# Patient Record
Sex: Male | Born: 1955 | Race: White | Hispanic: No | Marital: Married | Smoking: Never smoker
Health system: Southern US, Community
[De-identification: ages and names within clinical notes are randomized; demographics above are authoritative.]

## PROBLEM LIST (undated history)

## (undated) DIAGNOSIS — M545 Low back pain, unspecified: Secondary | ICD-10-CM

## (undated) DIAGNOSIS — Z9889 Other specified postprocedural states: Secondary | ICD-10-CM

## (undated) DIAGNOSIS — G4700 Insomnia, unspecified: Secondary | ICD-10-CM

## (undated) DIAGNOSIS — C801 Malignant (primary) neoplasm, unspecified: Secondary | ICD-10-CM

## (undated) DIAGNOSIS — B191 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: Secondary | ICD-10-CM

## (undated) DIAGNOSIS — G8929 Other chronic pain: Secondary | ICD-10-CM

## (undated) DIAGNOSIS — E0590 Thyrotoxicosis, unspecified without thyrotoxic crisis or storm: Secondary | ICD-10-CM

## (undated) DIAGNOSIS — G479 Sleep disorder, unspecified: Secondary | ICD-10-CM

## (undated) DIAGNOSIS — Z6831 Body mass index (BMI) 31.0-31.9, adult: Secondary | ICD-10-CM

## (undated) DIAGNOSIS — T7840XA Allergy, unspecified, initial encounter: Secondary | ICD-10-CM

## (undated) DIAGNOSIS — M542 Cervicalgia: Secondary | ICD-10-CM

## (undated) DIAGNOSIS — F329 Major depressive disorder, single episode, unspecified: Secondary | ICD-10-CM

## (undated) DIAGNOSIS — E039 Hypothyroidism, unspecified: Secondary | ICD-10-CM

## (undated) DIAGNOSIS — R011 Cardiac murmur, unspecified: Secondary | ICD-10-CM

## (undated) DIAGNOSIS — D721 Eosinophilia, unspecified: Secondary | ICD-10-CM

## (undated) DIAGNOSIS — L299 Pruritus, unspecified: Secondary | ICD-10-CM

## (undated) DIAGNOSIS — M1990 Unspecified osteoarthritis, unspecified site: Secondary | ICD-10-CM

## (undated) DIAGNOSIS — E0500 Thyrotoxicosis with diffuse goiter without thyrotoxic crisis or storm: Secondary | ICD-10-CM

## (undated) DIAGNOSIS — M5090 Cervical disc disorder, unspecified, unspecified cervical region: Secondary | ICD-10-CM

## (undated) DIAGNOSIS — Z8489 Family history of other specified conditions: Secondary | ICD-10-CM

## (undated) DIAGNOSIS — R0600 Dyspnea, unspecified: Secondary | ICD-10-CM

## (undated) DIAGNOSIS — M7580 Other shoulder lesions, unspecified shoulder: Secondary | ICD-10-CM

## (undated) DIAGNOSIS — I4891 Unspecified atrial fibrillation: Secondary | ICD-10-CM

## (undated) DIAGNOSIS — G4730 Sleep apnea, unspecified: Secondary | ICD-10-CM

## (undated) DIAGNOSIS — R7303 Prediabetes: Secondary | ICD-10-CM

## (undated) DIAGNOSIS — I499 Cardiac arrhythmia, unspecified: Secondary | ICD-10-CM

## (undated) DIAGNOSIS — G709 Myoneural disorder, unspecified: Secondary | ICD-10-CM

## (undated) DIAGNOSIS — F419 Anxiety disorder, unspecified: Secondary | ICD-10-CM

## (undated) DIAGNOSIS — E785 Hyperlipidemia, unspecified: Secondary | ICD-10-CM

## (undated) HISTORY — PX: NECK SURGERY: SHX720

## (undated) HISTORY — PX: HAND SURGERY: SHX662

## (undated) HISTORY — DX: Unspecified osteoarthritis, unspecified site: M19.90

## (undated) HISTORY — PX: SHOULDER SURGERY: SHX246

## (undated) HISTORY — DX: 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: B19.10

## (undated) HISTORY — DX: Anxiety disorder, unspecified: F41.9

## (undated) HISTORY — PX: KNEE ARTHROSCOPY: SHX127

## (undated) HISTORY — DX: Myoneural disorder, unspecified: G70.9

## (undated) HISTORY — DX: Pruritus, unspecified: L29.9

## (undated) HISTORY — DX: Low back pain, unspecified: M54.50

## (undated) HISTORY — DX: Other specified postprocedural states: Z98.890

## (undated) HISTORY — DX: Thyrotoxicosis, unspecified without thyrotoxic crisis or storm: E05.90

## (undated) HISTORY — DX: Major depressive disorder, single episode, unspecified: F32.9

## (undated) HISTORY — DX: Cervicalgia: M54.2

## (undated) HISTORY — PX: LIPOMA EXCISION: SHX5283

## (undated) HISTORY — DX: Other chronic pain: G89.29

## (undated) HISTORY — PX: BACK SURGERY: SHX140

## (undated) HISTORY — DX: Body mass index (BMI) 31.0-31.9, adult: Z68.31

## (undated) HISTORY — DX: Thyrotoxicosis with diffuse goiter without thyrotoxic crisis or storm: E05.00

## (undated) HISTORY — DX: Cardiac murmur, unspecified: R01.1

## (undated) HISTORY — DX: Allergy, unspecified, initial encounter: T78.40XA

## (undated) HISTORY — DX: Eosinophilia, unspecified: D72.10

## (undated) HISTORY — DX: Cervical disc disorder, unspecified, unspecified cervical region: M50.90

## (undated) HISTORY — DX: Other shoulder lesions, unspecified shoulder: M75.80

## (undated) HISTORY — DX: Insomnia, unspecified: G47.00

## (undated) HISTORY — PX: FISSURECTOMY: SHX5244

## (undated) HISTORY — DX: Hyperlipidemia, unspecified: E78.5

---
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
## 1998-06-25 ENCOUNTER — Ambulatory Visit (HOSPITAL_BASED_OUTPATIENT_CLINIC_OR_DEPARTMENT_OTHER): Admission: RE | Admit: 1998-06-25 | Discharge: 1998-06-25 | Payer: Self-pay | Admitting: Orthopedic Surgery

## 2003-07-05 ENCOUNTER — Ambulatory Visit (HOSPITAL_COMMUNITY): Admission: RE | Admit: 2003-07-05 | Discharge: 2003-07-05 | Payer: Self-pay | Admitting: Orthopedic Surgery

## 2003-08-18 ENCOUNTER — Encounter: Admission: RE | Admit: 2003-08-18 | Discharge: 2003-08-18 | Payer: Self-pay | Admitting: Orthopaedic Surgery

## 2003-09-05 HISTORY — PX: NECK SURGERY: SHX720

## 2003-09-27 ENCOUNTER — Inpatient Hospital Stay (HOSPITAL_COMMUNITY): Admission: RE | Admit: 2003-09-27 | Discharge: 2003-09-28 | Payer: Self-pay | Admitting: Neurosurgery

## 2006-02-02 ENCOUNTER — Ambulatory Visit (HOSPITAL_BASED_OUTPATIENT_CLINIC_OR_DEPARTMENT_OTHER): Admission: RE | Admit: 2006-02-02 | Discharge: 2006-02-02 | Payer: Self-pay | Admitting: Orthopedic Surgery

## 2008-12-05 DIAGNOSIS — Z9889 Other specified postprocedural states: Secondary | ICD-10-CM

## 2008-12-05 HISTORY — DX: Other specified postprocedural states: Z98.890

## 2010-01-01 ENCOUNTER — Encounter: Admission: RE | Admit: 2010-01-01 | Discharge: 2010-01-01 | Payer: Self-pay | Admitting: Orthopedic Surgery

## 2010-04-16 ENCOUNTER — Ambulatory Visit (HOSPITAL_BASED_OUTPATIENT_CLINIC_OR_DEPARTMENT_OTHER): Admission: RE | Admit: 2010-04-16 | Discharge: 2010-04-16 | Payer: Self-pay | Admitting: Orthopedic Surgery

## 2010-06-28 ENCOUNTER — Encounter
Admission: RE | Admit: 2010-06-28 | Discharge: 2010-06-28 | Payer: Self-pay | Source: Home / Self Care | Attending: Neurosurgery | Admitting: Neurosurgery

## 2010-09-18 LAB — POCT HEMOGLOBIN-HEMACUE: Hemoglobin: 15.3 g/dL (ref 13.0–17.0)

## 2010-11-22 NOTE — Op Note (Signed)
NAME:  Joshua Lowe, Joshua Lowe                            ACCOUNT NO.:  [id number]   MEDICAL RECORD NO.:  [id number]                   PATIENT TYPE:  INP   LOCATION:  3013                                 FACILITY:  [facility]   PHYSICIAN:  Hewitt Shorts, M.D.            DATE OF BIRTH:  Jan 17, 1956   DATE OF PROCEDURE:  09/27/2003  DATE OF DISCHARGE:                                 OPERATIVE REPORT   PREOPERATIVE DIAGNOSIS:  C4-5 and C5-6 cervical spondylosis, degenerative  disk disease, and radiculopathy.   POSTOPERATIVE DIAGNOSIS:  C4-5 and C5-6 cervical spondylosis, degenerative  disk disease, and radiculopathy, and right C5-6 cervical disk herniation.   PROCEDURE:  C4-5 and C5-6 anterior cervical diskectomy and arthrodesis with  iliac crest allograft and Trinica cervical plating.   SURGEON:  Sandria Bales. Ezzard Standing, M.D.   ASSISTANT:  Stefani Dama, M.D.   ANESTHESIA:  General endotracheal.   INDICATIONS:  The patient is a 55 year old man who presented with a right  cervical radiculopathy, with weakness of the right biceps.  He was studied  with MRI and subsequently a myelogram and post-myelogram CT scan.  The  decision was made to proceed with decompression and arthrodesis.   PROCEDURE:  The patient was brought to the operating room and placed under  general endotracheal anesthesia.  The patient was placed in 10 pounds of  Holter traction and the neck was prepped with Betadine soap and solution and  draped in a sterile fashion.  A horizontal incision was made in the left  side of the neck.  The line of the incision was infiltrated with local  anesthetic with epinephrine.  Dissection was carried down through the  subcutaneous tissue with bipolar cautery and electrocautery used to maintain  hemostasis.  Dissection was carried down through the platysma and then  through an avascular plane, leaving the sternocleidomastoid, carotid artery,  and jugular vein laterally and the trachea and  esophagus medially.  The  ventral aspect of the vertebral column was identified and a localizing x-ray  taken at C4-5 and C5-6 and the intervertebral disk space identified.  Diskectomy was begun at each level with incision of the annulus and  continued with the microcurettes and pituitary rongeurs.  Anterior  osteophytic overgrowth was removed using the osteophyte removal tool and  then the microscope was draped and brought into the field to provide  additional magnification, illumination, and visualization, and the remainder  of the decompression was performed using microdissection and microsurgical  technique.  The cartilaginous end plates of the corresponding vertebrae were  removed using the microcurettes along with the Micro-Max drill, and  dissection was carried out posteriorly, removing posterior osteophytic  overgrowth using the Micro-Max drill along with a 2 mm Kerrison punch with a  thin foot plate.  The posterior longitudinal ligament, which was thickened  at each level, was carefully removed, and we were able to decompress the  spinal canal and thecal sac.  We then decompressed the neural foramina  bilaterally and then by the nerve roots bilaterally.  At the right-sided C5-  6 where we expected to find spondylosis including spondylitic spurring and  thickened ligament tissue, we did encounter those findings but also  encountered a fragment of disk herniation.  This was removed and we were  able to decompress the right C6 nerve root and lateral thecal sac at that  level.  It was felt that the disk herniation that was found at the time of  surgery corresponded to his acute radiculopathy.   After the decompression was completed, hemostasis was established with the  use of Gelfoam soaked in thrombin.  All the Gelfoam was removed and we  proceeded with the arthrodesis.  We sized each of the disk spaces and  selected a 6 mm graft for C4-5 and a 7 mm graft for C5-6.  Each graft was   hydrated in saline solution and positioned in the intervertebral disk space  and counter sunk.  We then selected a two-level Trinica cervical plate.  It  was positioned over the fusion construct and secured to each of the  vertebrae with a pair of 4.2 x 14 mm variable self-drilling screws.  Screws  were placed in an alternating fashion.  All six screws were placed, fully  tightened, and then the locking system secured.  The traction had been  discontinued after the bone grafts had been placed but prior to the plating.  The wound was irrigated with bacitracin solution and checked for hemostasis,  which was established and confirmed.  An x-ray was taken that showed the  plate, screws, and grafts were all in good position and we proceeded with  closure.  The platysma was closed with interrupted, inverted 2-0 undyed  Vicryl sutures, the subcutaneous and subcuticular layer were closed with  interrupted, inverted 2-0 undyed Vicryl sutures, and the skin was  reapproximated with Dermabond.  The patient tolerated the procedure well.  The estimated blood loss was less than 50 mL.  Sponge and needle count were  correct.  Following surgery the patient was to be reversed from the  anesthetic, extubated, and transferred to the recovery room for further  care.                                               Hewitt Shorts, M.D.    RWN/MEDQ  D:  09/27/2003  T:  09/28/2003  Job:  [id number]

## 2010-11-22 NOTE — Op Note (Signed)
NAMEJAVYON, Joshua Lowe                  ACCOUNT NO.:  [id number]   MEDICAL RECORD NO.:  [id number]          PATIENT TYPE:  AMB   LOCATION:  DSC                          FACILITY:  [facility]   PHYSICIAN:  Robert A. Thurston Hole, M.D. 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   PROCEDURE:  1. Right knee EUA followed by arthroscopic partial medial and lateral      meniscectomies.  2. Right knee chondroplasty with synovectomy.  3. Right knee loose bodies excision.   SURGEON:  Dr. Salvatore Marvel.   ASSISTANT:  Kirstin Tomasa Rand, P.A.   ANESTHESIA:  General.   OPERATIVE TIME:  40 minutes.   COMPLICATIONS:  None.   INDICATIONS FOR PROCEDURE:  Joshua Lowe is a 55 year old gentleman who has had  eight to 10 months of increasing right knee pain with exam and MRI  documenting chondromalacia, loose bodies, medial and lateral meniscal tears  with synovitis.  He has failed conservative care and is now to undergo  arthroscopy.   DESCRIPTION OF PROCEDURE:  Joshua Lowe was brought to the operating room on  February 02, 2006 and placed on the operative table in the supine position.  He  received Ancef 1 gram IV preoperatively for prophylaxis.  His right knee was  examined under anesthesia.  He had a full range of motion and he had a  stable ligamentous exam.  The knee was sterilely injected with 0.25-percent  Marcaine with epinephrine.  The right leg was prepped using sterile  technique and draped using sterile technique.  Originally through an  anterolateral portal, the arthroscope with a pump attached was placed and  through an  anterolateral portal, an arthroscopic probe was placed.  On  initial inspection in the medial compartment, he was found to have 75  percent grade 3 chondromalacia which was debrided.  Loose articular  cartilage pieces floating in the joint were removed.  Medial meniscus tear  posterior and medial horn of which 30 to 40 percent was resected back to a  stable rim.  Medial tibial plateau showed 30 percent grade 3 and the rest  grade 1 to 2 changes and this was debrided.  The intercondylar notch was  inspected and the anterior and posterior cruciate ligaments were normal.  The lateral compartment was inspected with 30 to 40 percent grade 3  chondromalacia laterally and on the lateral upper condyle was debrided.  Lateral tibial plateau showed grade 1 and 2 changes.  Lateral meniscus tear  and posterior and lateral horn, of which 20 percent was resected back to a  stable rim.  Loose articular cartilage pieces floating in the lateral  compartment were also removed.  Patellofemoral joint showed 80 percent grade  3 chondromalacia of the patellofemoral groove and this  was thoroughly  debrided.  Large inferomedial patella spur was removed with a 4-mm bur.  Normal patellar tracking was noted.  Significant synovitis in the medial  gutters were debrided.  Medial gutters had loose articular cartilage pieces  which were also removed.  Otherwise they were free of pathology.  After this  was done, it was felt that all pathology had been satisfactorily addressed.  The instruments were removed.  Portals closed with 3-0 nylon sutures and  injected with 0.25-percent Marcaine with epinephrine and 4 mg of morphine.  Sterile dressing was applied and the patient awakened and taken to the  recovery room in stable condition.   FOLLOW UP:  Joshua Lowe will be followed as an outpatient on Vicodin and  voltaren.  We will see him back in the office in one week for sutures out  and follow-up.      Robert A. Thurston Hole,  M.D.  Electronically Signed     RAW/MEDQ  D:  02/02/2006  T:  02/03/2006  Job:  [id number]

## 2011-11-20 ENCOUNTER — Encounter (INDEPENDENT_AMBULATORY_CARE_PROVIDER_SITE_OTHER): Payer: Self-pay | Admitting: Surgery

## 2011-11-24 ENCOUNTER — Encounter (INDEPENDENT_AMBULATORY_CARE_PROVIDER_SITE_OTHER): Payer: Self-pay | Admitting: Surgery

## 2011-11-24 ENCOUNTER — Ambulatory Visit (INDEPENDENT_AMBULATORY_CARE_PROVIDER_SITE_OTHER): Payer: BC Managed Care – PPO | Admitting: Surgery

## 2011-11-24 DIAGNOSIS — K603 Anal fistula, unspecified: Secondary | ICD-10-CM | POA: Insufficient documentation

## 2011-11-24 DIAGNOSIS — K602 Anal fissure, unspecified: Secondary | ICD-10-CM | POA: Insufficient documentation

## 2011-11-24 NOTE — Progress Notes (Signed)
Patient ID: [name], male   DOB: 10/31/1955, 56 y.o.   MRN: [id number]  Chief Complaint  Patient presents with  . Pre-op Exam    eval poss fistula/fissure    HPI Joshua Lowe is a 56 y.o. male.  This is a very pleasant gentleman referred by Dr. Eula Listen for evaluation of an anal fissure and fistula. He has had a problem since 2007 off and on. His most recent attack was treated with diltiazem cream. His pain completely resolved when he saw Dr. Eula Listen, a fistula was noted. The patient will leak occasional blood but no purulence or stool. Again he is currently pain-free. He does have intermittent constipation. He has had no sphincter issues. HPI  Past Medical History  Diagnosis Date  . Insomnia   . Depression   . Anxiety   . H/O colonoscopy 12/2008  . Chronic neck pain   . Allergy   . Hyperlipidemia   . AC (acromioclavicular) joint bone spurs   . Arthritis   . Heart murmur   . Neuromuscular disorder   . Osteoporosis     Past Surgical History  Procedure Date  . Shoulder surgery   . Fissurectomy   . Neck surgery   . Lipoma excision     back  . Knee arthroscopy 1999/2007    right knee    Family History  Problem Relation Age of Onset  . Cancer Mother     skin    Social History History  Substance Use Topics  . Smoking status: Former Games developer  . Smokeless tobacco: Former Neurosurgeon    Quit date: 11/24/1986  . Alcohol Use: 0.0 oz/week    1-2 Cans of beer per week    Allergies  Allergen Reactions  . Bee Venom     Current Outpatient Prescriptions  Medication Sig Dispense Refill  . cetirizine (ZYRTEC) 10 MG tablet Take 10 mg by mouth as needed.      . diltiazem 2 % GEL Apply topically 2 (two) times daily.      Marland Kitchen EPINEPHrine (EPIPEN JR) 0.15 MG/0.3ML injection Inject 0.15 mg into the muscle as needed.      Marland Kitchen HYDROcodone-acetaminophen (NORCO) 5-325 MG per tablet Take 1 tablet by mouth every 6 (six) hours as needed.      . traZODone (DESYREL) 150 MG tablet Take 150 mg by  mouth at bedtime.      Marland Kitchen zolpidem (AMBIEN) 10 MG tablet Take 10 mg by mouth at bedtime as needed.        Review of Systems Review of Systems  Constitutional: Negative for fever, chills and unexpected weight change.  HENT: Negative for hearing loss, congestion, sore throat, trouble swallowing and voice change.   Eyes: Negative for visual disturbance.  Respiratory: Negative for cough and wheezing.   Cardiovascular: Negative for chest pain, palpitations and leg swelling.  Gastrointestinal: Positive for constipation and rectal pain. Negative for nausea, vomiting, abdominal pain, diarrhea, blood in stool, abdominal distention and anal bleeding.  Genitourinary: Negative for hematuria and difficulty urinating.  Musculoskeletal: Negative for arthralgias.  Skin: Negative for rash and wound.  Neurological: Negative for seizures, syncope, weakness and headaches.  Hematological: Negative for adenopathy. Does not bruise/bleed easily.  Psychiatric/Behavioral: Negative for confusion.    Blood pressure 132/80, pulse 78, height 6' (1.829 m), weight 221 lb 12.8 oz (100.608 kg), SpO2 96.00%.  Physical Exam Physical Exam  Constitutional: He is oriented to person, place, and time. He appears well-developed and well-nourished.  HENT:  Head: Normocephalic and atraumatic.  Right Ear: External ear normal.  Left Ear: External ear normal.  Nose: Nose normal.  Mouth/Throat: Oropharynx is clear and moist.  Eyes: Conjunctivae are normal. Pupils are equal, round, and reactive to light.  Neck: Normal range of motion. Neck supple. No tracheal deviation present. No thyromegaly present.  Cardiovascular: Normal rate, regular rhythm, normal heart sounds and intact distal pulses.   No murmur heard. Pulmonary/Chest: Effort normal and breath sounds normal. No respiratory distress. He has no wheezes. He has no rales.  Abdominal: Soft. Bowel sounds are normal. He exhibits no distension. There is tenderness.    Genitourinary:       On rectal exam, there is an obvious fistula opening just to the left of the posterior midline. I attempted a digital exam, but his sphincter was tight and painful.  Lymphadenopathy:    He has no cervical adenopathy.  Neurological: He is alert and oriented to person, place, and time.  Skin: Skin is warm and dry. No rash noted. No erythema.  Psychiatric: His behavior is normal.    Data Reviewed   Assessment    Anal fistula and fissure    Plan    I explained the diagnosis to the patient his wife in detail. I discussed options which included conservative measures with going ahead and proceeding with surgery. I asked when into the fistula would not heal without surgical intervention. I did explain that it was not emergent if he wanted to wait and see our new colorectal surgeon coming in September without having to go and proceed with that now. I believe he would need a sphincterotomy as well as a fissurectomy. He is going to think about it and call us back       Binh Doten A 11/24/2011, 11:25 AM

## 2012-07-19 ENCOUNTER — Ambulatory Visit (INDEPENDENT_AMBULATORY_CARE_PROVIDER_SITE_OTHER): Payer: BC Managed Care – PPO | Admitting: General Surgery

## 2012-07-19 ENCOUNTER — Encounter (INDEPENDENT_AMBULATORY_CARE_PROVIDER_SITE_OTHER): Payer: Self-pay | Admitting: General Surgery

## 2012-07-19 VITALS — BP 122/78 | HR 68 | Temp 98.2°F | Resp 16 | Ht 72.0 in | Wt 214.4 lb

## 2012-07-19 DIAGNOSIS — K603 Anal fistula, unspecified: Secondary | ICD-10-CM

## 2012-07-19 DIAGNOSIS — K602 Anal fissure, unspecified: Secondary | ICD-10-CM

## 2012-07-19 NOTE — Progress Notes (Signed)
Chief Complaint  Patient presents with  . New Evaluation    EPNP:eval anal fissure    HISTORY: Joshua Lowe is a 57 y.o. male who presents to the office with rectal pain.  Other symptoms include occasional bleeding.  This had been occurring for several years.  He has tried diltiazem cream in the past with good results.  Hard Bm's makes the symptoms worse.   It is intermittent in nature.  His bowel habits are regular and his bowel movements are soft.  His fiber intake is good.  His last colonoscopy was 4 years ago and he is due again at 6 yrs of age.  He denies any prolapsing tissue.     Past Medical History  Diagnosis Date  . Insomnia   . Depression   . Anxiety   . H/O colonoscopy 12/2008  . Chronic neck pain   . Allergy   . Hyperlipidemia   . AC (acromioclavicular) joint bone spurs   . Arthritis   . Heart murmur   . Neuromuscular disorder   . Osteoporosis       Past Surgical History  Procedure Date  . Shoulder surgery   . Fissurectomy   . Neck surgery   . Lipoma excision     back  . Knee arthroscopy 1999/2007    right knee        Current Outpatient Prescriptions  Medication Sig Dispense Refill  . cetirizine (ZYRTEC) 10 MG tablet Take 10 mg by mouth as needed.      Marland Kitchen EPINEPHrine (EPIPEN JR) 0.15 MG/0.3ML injection Inject 0.15 mg into the muscle as needed.      Marland Kitchen HYDROcodone-acetaminophen (NORCO) 5-325 MG per tablet Take 1 tablet by mouth every 6 (six) hours as needed.      . traZODone (DESYREL) 150 MG tablet Take 150 mg by mouth at bedtime. 50 mg taken at bedtime      . zolpidem (AMBIEN) 10 MG tablet Take 10 mg by mouth at bedtime as needed.      . diltiazem 2 % GEL Apply topically 2 (two) times daily.          Allergies  Allergen Reactions  . Bee Venom       Family History  Problem Relation Age of Onset  . Cancer Mother     skin    History   Social History  . Marital Status: Married    Spouse Name: N/A    Number of Children: N/A  . Years of Education:  N/A   Social History Main Topics  . Smoking status: Former Games developer  . Smokeless tobacco: Former Neurosurgeon    Quit date: 11/24/1986  . Alcohol Use: 0.0 oz/week    1-2 Cans of beer per week  . Drug Use: No  . Sexually Active:    Other Topics Concern  . None   Social History Narrative  . None      REVIEW OF SYSTEMS - PERTINENT POSITIVES ONLY: Review of Systems - General ROS: negative for - chills, fever or weight loss Hematological and Lymphatic ROS: negative for - bleeding problems, blood clots or bruising Respiratory ROS: no cough, shortness of breath, or wheezing Cardiovascular ROS: no chest pain or dyspnea on exertion Gastrointestinal ROS: positive for - blood in stools negative for - abdominal pain or constipation Genito-Urinary ROS: no dysuria, trouble voiding, or hematuria  EXAM: Filed Vitals:   07/19/12 1542  BP: 122/78  Pulse: 68  Temp: 98.2 F (36.8 C)  Resp:  16    General appearance: alert and cooperative Resp: clear to auscultation bilaterally Cardio: regular rate and rhythm GI: normal findings: soft, non-tender   Procedure: Anoscopy Surgeon: Maisie Fus Diagnosis: rectal pain  Assistant: Christella Scheuermann After the risks and benefits were explained, verbal consent was obtained for above procedure  Anesthesia: none Findings: posterior fistula with low internal opening posteriorly, moderate sphincter htn    ASSESSMENT AND PLAN: Joshua Lowe is a 57 y.o. M with a posterior anal fissure and fistula.  He has tried medical therapy in the past with some success but now is having intermittent episodes of anal drainage.  I believe this is from his fistula.  The fistula appears to be low and probably due to the incomplete healing of his fissure.  I have recommended at fistulotomy and possible lateral internal sphincterotomy if his anal canal is still under tension after fistulotomy.  I believe this will allow the area to heal correctly.  The long-term risk of this is a chance of  fecal incontinence.  I believe this risk is minimal and the benefits of surgery outweigh this.  We will schedule surgery at his convenience.     Vanita Panda, MD Colon and Rectal Surgery / General Surgery [facility]      Visit Diagnoses: 1. Anal fissure and fistula     Primary Care Physician: Georgann Housekeeper, MD

## 2012-07-19 NOTE — Patient Instructions (Addendum)
Anal Abscess/Fistula  What is an anal abscess? An anal abscess is an infected cavity filled with pus found near the anus or rectum. What is an anal fistula? An anal fistula (also called fistula-in-ano) is frequently the result of a previous or current anal abscess, occurring in up to 50% of patients with abscesses. Normal anatomy includes small glands just inside the anus. Occasionally, these glands get clogged and potentially can become infected, leading to an abscess. The fistula is a tunnel that forms under the skin and connects the infected glands to the abscess. A fistula can be present with or without an abscess and may connect just to the skin of the buttocks near the anal opening. Other situations that can result in a fistula include Crohn's disease, radiation, trauma and malignancy. How does someone get an anal abscess or a fistula? The abscess is most often a result of an acute infection in the internal glands of the anus. Occasionally, bacteria, fecal material or foreign matter can clog the anal gland and create a condition for an abscess cavity to form. Other medical conditions can make these types of infections more likely.  After an abscess drains on its own or has been drained (opened), a tunnel (fistula) may persist, connecting the infected anal gland to the external skin. This typically will involve some type of drainage from the external opening and occurs in up to 50% of abscesses. If the opening on the skin heals when a fistula is present, a recurrent abscess may develop. What are the specific signs or symptoms of an abscess or fistula? A patient with an abscess may have pain, redness or swelling in the area around the anal area. Fatigue, general malaise, as well as accompanying fever or chills are also common. Similar signs and symptoms may be present when patients have a fistula, with the addition of possible irritation of the perianal skin or drainage from an external opening. Is  any specific testing necessary to diagnose an abscess or fistula? No. Most anal abscesses or fistula-in-ano are diagnosed and managed on the basis of clinical findings. Occasionally, additional studies such as ultrasound, CT scan, or MRI can assist with the diagnosis of deeper abscesses or the delineation of the fistula tunnel to help guide treatment.  What is the treatment of an anal abscess? The treatment of an abscess is surgical drainage under most circumstances. An incision is made in the skin near the anus to drain the infection. This can be done in a doctor's office with local anesthetic or in an operating room under deeper anesthesia. Hospitalization may be required for patients prone to more significant infections such as diabetics or patients with decreased immunity. Are antibiotics required to treat this type of infection? Antibiotics alone are a poor alternative to drainage of the infection. For uncomplicated abscesses, the addition of antibiotics to surgical drainage does not improve healing time or reduce the potential for recurrences. There are some conditions in which antibiotics are indicated, such as for patients with compromised or altered immunity, some cardiac valvular conditions or extensive cellulitis. A comprehensive discussion of your past medical history and a physical exam are important to determine if antibiotics are indicated. What is the treatment of an anal fistula? Surgery is almost always necessary to cure an anal fistula. Although surgery can be fairly straightforward, it may also be complicated, occasionally requiring staged or multiple operations. Consider identifying a specialist in colon and rectal surgery who would be familiar with a number of potential operations   to treat the fistula. The surgery may be performed at the same time as drainage of an abscess, although sometimes the fistula doesn't appear until weeks to years after the initial drainage. If the fistula is  straightforward, a fistulotomy may be performed. This procedure involves connecting the internal opening within the anal canal to the external opening, creating a groove that will heal from the inside out. This surgery often will require dividing a small portion of the sphincter muscle which has the unlikely potential for affecting the control of bowel movements in a limited number of cases.  Other procedures include placing material within the fistula tract to occlude it or surgically altering the surrounding tissue to accomplish closure of the fistula, with the choice of procedure depending upon the type, length, and location of the fistula. Most of the operations can be performed on an outpatient basis, but may occasionally require hospitalization. What is the recovery like from surgery? Pain after surgery is controlled with pain pills, fiber and bulk laxatives. Patients should plan for time at home using sitz baths and attempt to avoid the constipation that can be associated with prescription pain medication. Discuss with your surgeon the specific care and time away from work prior to surgery to prepare yourself for post-operative care. Can the abscess or fistula recur? If adequately treated and properly healed, both are unlikely to return. However, despite proper and indicated open or minimally invasive treatment, both abscesses and fistulas can potentially recur. Should similar symptoms arise, suggesting recurrence, it is recommended that you find a colon and rectal surgeon to manage your condition. What is a colon and rectal surgeon? Colon and rectal surgeons are experts in the surgical and non-surgical treatment of diseases of the colon, rectum and anus. They have completed advanced surgical training in the treatment of these diseases as well as full general surgical training. Board-certified colon and rectal surgeons complete residencies in general surgery and colon and rectal surgery, and pass  intensive examinations conducted by the American Board of Surgery and the American Board of Colon and Rectal Surgery. They are well-versed in the treatment of both benign and malignant diseases of the colon, rectum and anus and are able to perform routine screening examinations and surgically treat conditions if indicated to do so.  author: Mikle Bosworth, MD, FACS, FASCRS, on behalf of the [employer]CR[employer] Public Relations Committee  1610 American Society of Colon & Rectal Surgeons    WHAT IS AN ANAL FISSURE? An anal fissure (fissure-in-ano) is a small, oval shaped tear in skin that lines the opening of the anus. Fissures typically cause severe pain and bleeding with bowel movements. Fissures are quite common in the general population, but are often confused with other causes of pain and bleeding, such as hemorrhoids. WHAT ARE THE SYMPTOMS OF AN ANAL FISSURE? The typical symptoms of an anal fissure include severe pain during, and especially after, a bowel movement, lasting from several minutes to a few hours. Patients may also notice bright red blood from the anus that can be seen on the toilet paper or on the stool. Between bowel movements, patients with anal fissures are often relatively symptom-free. Many patients are fearful of having a bowel movement and may try to avoid defecation secondary to the pain.  WHAT CAUSES AN ANAL FISSURE? Fissures are usually caused by trauma to the inner lining of the anus. Patients with tight anal sphincter muscles (i.e., increased muscle tone) are more prone to developing anal fissures. A hard, dry bowel movement  is typically responsible, but loose stools and diarrhea can also be the cause. Following a bowel movement, severe anal pain can produce spasm of the anal sphincter muscle, resulting in a decrease in blood flow to the site of the injury, thus impairing healing of the wound. The next bowel movement results in more pain, anal spasm, decreased blood flow to the area,  and the cycle continues. Treatments are aimed at interrupting this cycle by relaxing the anal sphincter muscle to promote healing of the fissure.  Other, less common, causes include inflammatory conditions and certain anal infections or tumors. Anal fissures may be acute (recent onset) or chronic (present for a long period of time). Chronic fissures may be more difficult to treat, and may also have an external lump associated with the tear, called a sentinel pile or skin tag, as well as extra tissue just inside the anal canal (hypertrophied papilla) . WHAT IS THE TREATMENT OF ANAL FISSURES? The majority of anal fissures do not require surgery. The most common treatment for an acute anal fissure consists of making the stool more formed and bulky with a diet high in fiber and utilization of over-the-counter fiber supplementation (totaling 25-35 grams of fiber/day). Stool softeners and increasing water intake may be necessary to promote soft bowel movements and aid in the healing process. Topical anesthetics for pain and warm tub baths (sitz baths) for 10-20 minutes several times a day (especially after bowel movements) are soothing and promote relaxation of the anal muscles, which may help the healing process.  Other medications (such as nitroglycerin, nifedipine, or diltiazem) may be prescribed that allow relaxation of the anal sphincter muscles. Your surgeon will go over benefits and side-effects of each of these with you. Narcotic pain medications are not recommended for anal fissures, as they promote constipation. Chronic fissures are generally more difficult to treat, and your surgeon may advise surgical treatment. WILL THE PROBLEM RETURN? Fissures can recur easily, and it is quite common for a fully healed fissure to recur after a hard bowel movement or other trauma. Even when the pain and bleeding have subsided, it is very important to continue good bowel habits and a diet high in fiber as a lifestyle  change. If the problem returns without an obvious cause, further assessment is warranted. WHAT CAN BE DONE IF THE FISSURE DOES NOT HEAL? A fissure that fails to respond to conservative measures should be re-examined. Persistent hard or loose bowel movements, scarring, or spasm of the internal anal muscle all contribute to delayed healing. Other medical problems such as inflammatory bowel disease (Crohn's disease), infections, or anal tumors can cause symptoms similar to anal fissures. Patients suffering from persistent anal pain should be examined to exclude these symptoms. This may include a colonoscopy or an exam in the operating room under anesthesia. WHAT DOES SURGERY INVOLVE? Surgical options for treating anal fissure include Botulinum toxin (Botox) injection into the anal sphincter and surgical division of a portion of the internal anal sphincter (lateral internal sphincterotomy). Both of these are performed typically as outpatient, same-day procedures, or occasionally in the office setting. The goal of these surgical options is to promote relaxation of the anal sphincter, thereby decreasing anal pain and spasm, allowing the fissure to heal. Botox injection results in healing in 50-80% of patients, while sphincterotomy is reported to be over 90% successful. If a sentinel pile is present, it may be removed to promote healing of the fissure. All surgical procedures carry some risk, and a sphincterotomy can  rarely interfere with one's ability to control gas and stool. Your colon and rectal surgeon will discuss these risks with you to determine the appropriate treatment for your particular situation. HOW LONG IS THE RECOVERY AFTER SURGERY? It is important to note that complete healing with both medical and surgical treatments can take up to approximately 6-10 weeks. However, acute pain after surgery often disappears after a few days. Most patients will be able to return to work and resume daily activities  in a few short days after the surgery. CAN FISSURES LEAD TO COLON CANCER? Absolutely not. Persistent symptoms, however, need careful evaluation since other conditions other than an anal fissure can cause similar symptoms. Your colon and rectal surgeon may request additional tests, even if your fissure has successfully healed. A colonoscopy may be required to exclude other causes of rectal bleeding. WHAT IS A COLON AND RECTAL SURGEON? Colon and rectal surgeons are experts in the surgical and non-surgical treatment of diseases of the colon, rectum and anus. They have completed advanced surgical training in the treatment of these diseases as well as full general surgical training. Board-certified colon and rectal surgeons complete residencies in general surgery and colon and rectal surgery, and pass intensive examinations conducted by the American Board of Surgery and the American Board of Colon and Rectal Surgery. They are well-versed in the treatment of both benign and malignant diseases of the colon, rectum and anus and are able to perform routine screening examinations and surgically treat conditions if indicated to do so.  Author: Tyrone Apple. Pennie Banter, DO, on behalf of the [employer] Committee   2012 American Society of Colon & Rectal Surgeons

## 2012-08-13 DIAGNOSIS — K603 Anal fistula: Secondary | ICD-10-CM

## 2012-08-16 ENCOUNTER — Telehealth (INDEPENDENT_AMBULATORY_CARE_PROVIDER_SITE_OTHER): Payer: Self-pay | Admitting: General Surgery

## 2012-08-16 NOTE — Telephone Encounter (Signed)
Spoke with patient he states he is doing good. Po f/u appt is made for 09/08/12 at 3:35PM

## 2012-09-02 ENCOUNTER — Other Ambulatory Visit (HOSPITAL_COMMUNITY): Payer: Self-pay | Admitting: Internal Medicine

## 2012-09-02 ENCOUNTER — Other Ambulatory Visit: Payer: Self-pay | Admitting: Internal Medicine

## 2012-09-02 DIAGNOSIS — R945 Abnormal results of liver function studies: Secondary | ICD-10-CM

## 2012-09-02 DIAGNOSIS — R748 Abnormal levels of other serum enzymes: Secondary | ICD-10-CM

## 2012-09-03 ENCOUNTER — Ambulatory Visit (HOSPITAL_COMMUNITY): Payer: Self-pay

## 2012-09-03 ENCOUNTER — Ambulatory Visit (HOSPITAL_COMMUNITY)
Admission: RE | Admit: 2012-09-03 | Discharge: 2012-09-03 | Disposition: A | Discharge status: Home / Self Care | Payer: BC Managed Care – PPO | Source: Ambulatory Visit | Attending: Internal Medicine | Admitting: Internal Medicine

## 2012-09-03 DIAGNOSIS — R748 Abnormal levels of other serum enzymes: Secondary | ICD-10-CM

## 2012-09-03 DIAGNOSIS — R7989 Other specified abnormal findings of blood chemistry: Secondary | ICD-10-CM | POA: Insufficient documentation

## 2012-09-08 ENCOUNTER — Ambulatory Visit (INDEPENDENT_AMBULATORY_CARE_PROVIDER_SITE_OTHER): Payer: BC Managed Care – PPO | Admitting: General Surgery

## 2012-09-08 ENCOUNTER — Encounter (INDEPENDENT_AMBULATORY_CARE_PROVIDER_SITE_OTHER): Payer: Self-pay | Admitting: General Surgery

## 2012-09-08 VITALS — BP 112/62 | HR 67 | Temp 98.1°F | Resp 18 | Ht 72.0 in | Wt 200.6 lb

## 2012-09-08 DIAGNOSIS — K603 Anal fistula: Secondary | ICD-10-CM

## 2012-09-08 DIAGNOSIS — K602 Anal fissure, unspecified: Secondary | ICD-10-CM

## 2012-09-08 NOTE — Patient Instructions (Addendum)
Follow up as needed

## 2012-09-08 NOTE — Progress Notes (Signed)
Joshua Lowe is a 57 y.o. male who is status post a fistulotomy on 2/7.  He has done well.  He had minimal pain and bleeding.  He used the sitz baths to control pain.  He denies any pain now or bleeding.  Objective: Filed Vitals:   09/08/12 1539  BP: 112/62  Pulse: 67  Temp: 98.1 F (36.7 C)  Resp: 18    General appearance: alert and cooperative  Incision: healing well, slight granulation tissue present   Assessment: s/p  Patient Active Problem List  Diagnosis  . Anal fissure and fistula    Plan: Doing well.  RTO PRN.  Ok to start increasing activity.     Vanita Panda, MD [facility], [place] [phone]   09/08/2012 3:52 PM

## 2015-07-17 ENCOUNTER — Other Ambulatory Visit: Payer: Self-pay | Admitting: Neurosurgery

## 2015-07-17 DIAGNOSIS — M4326 Fusion of spine, lumbar region: Secondary | ICD-10-CM

## 2015-07-17 DIAGNOSIS — M48062 Spinal stenosis, lumbar region with neurogenic claudication: Secondary | ICD-10-CM

## 2015-07-17 DIAGNOSIS — G9519 Other vascular myelopathies: Secondary | ICD-10-CM

## 2015-07-24 ENCOUNTER — Ambulatory Visit
Admission: RE | Admit: 2015-07-24 | Discharge: 2015-07-24 | Disposition: A | Discharge status: Home / Self Care | Payer: BLUE CROSS/BLUE SHIELD | Source: Ambulatory Visit | Attending: Neurosurgery | Admitting: Neurosurgery

## 2015-07-24 DIAGNOSIS — R29818 Other symptoms and signs involving the nervous system: Secondary | ICD-10-CM

## 2015-07-24 DIAGNOSIS — G9519 Other vascular myelopathies: Secondary | ICD-10-CM

## 2015-07-24 DIAGNOSIS — M4326 Fusion of spine, lumbar region: Secondary | ICD-10-CM

## 2015-07-24 DIAGNOSIS — M48062 Spinal stenosis, lumbar region with neurogenic claudication: Secondary | ICD-10-CM

## 2017-09-17 ENCOUNTER — Other Ambulatory Visit: Payer: Self-pay | Admitting: Internal Medicine

## 2017-09-17 ENCOUNTER — Ambulatory Visit
Admission: RE | Admit: 2017-09-17 | Discharge: 2017-09-17 | Disposition: A | Discharge status: Home / Self Care | Payer: No Typology Code available for payment source | Source: Ambulatory Visit | Attending: Internal Medicine | Admitting: Internal Medicine

## 2017-09-17 DIAGNOSIS — R109 Unspecified abdominal pain: Secondary | ICD-10-CM

## 2017-09-17 DIAGNOSIS — R634 Abnormal weight loss: Secondary | ICD-10-CM

## 2017-09-17 MED ORDER — IOPAMIDOL (ISOVUE-300) INJECTION 61%
100.0000 mL | Freq: Once | INTRAVENOUS | Status: AC | PRN
  Administered 2017-09-17: 100 mL via INTRAVENOUS

## 2017-09-29 ENCOUNTER — Other Ambulatory Visit: Payer: BLUE CROSS/BLUE SHIELD

## 2018-01-11 ENCOUNTER — Other Ambulatory Visit (HOSPITAL_COMMUNITY): Payer: Self-pay | Admitting: Internal Medicine

## 2018-01-11 DIAGNOSIS — E0590 Thyrotoxicosis, unspecified without thyrotoxic crisis or storm: Secondary | ICD-10-CM

## 2018-01-11 DIAGNOSIS — E0500 Thyrotoxicosis with diffuse goiter without thyrotoxic crisis or storm: Secondary | ICD-10-CM

## 2018-01-13 ENCOUNTER — Other Ambulatory Visit (HOSPITAL_COMMUNITY): Payer: Self-pay | Admitting: Internal Medicine

## 2018-01-13 DIAGNOSIS — E0590 Thyrotoxicosis, unspecified without thyrotoxic crisis or storm: Secondary | ICD-10-CM

## 2018-01-25 ENCOUNTER — Encounter (HOSPITAL_COMMUNITY)
Admission: RE | Admit: 2018-01-25 | Discharge: 2018-01-25 | Disposition: A | Discharge status: Home / Self Care | Payer: Self-pay | Source: Ambulatory Visit | Attending: Internal Medicine | Admitting: Internal Medicine

## 2018-01-25 DIAGNOSIS — E0500 Thyrotoxicosis with diffuse goiter without thyrotoxic crisis or storm: Secondary | ICD-10-CM

## 2018-01-25 DIAGNOSIS — E0590 Thyrotoxicosis, unspecified without thyrotoxic crisis or storm: Secondary | ICD-10-CM

## 2018-01-25 MED ORDER — SODIUM IODIDE I-123 7.4 MBQ CAPS
443.0000 | ORAL_CAPSULE | Freq: Once | ORAL | Status: AC
  Administered 2018-01-25: 443 via ORAL

## 2018-01-26 ENCOUNTER — Encounter (HOSPITAL_COMMUNITY)
Admission: RE | Admit: 2018-01-26 | Discharge: 2018-01-26 | Disposition: A | Discharge status: Home / Self Care | Payer: Self-pay | Source: Ambulatory Visit | Attending: Internal Medicine | Admitting: Internal Medicine

## 2018-02-01 ENCOUNTER — Ambulatory Visit (HOSPITAL_COMMUNITY)
Admission: RE | Admit: 2018-02-01 | Discharge: 2018-02-01 | Disposition: A | Discharge status: Home / Self Care | Payer: Self-pay | Source: Ambulatory Visit | Attending: Internal Medicine | Admitting: Internal Medicine

## 2018-02-01 DIAGNOSIS — E0590 Thyrotoxicosis, unspecified without thyrotoxic crisis or storm: Secondary | ICD-10-CM | POA: Insufficient documentation

## 2018-02-01 MED ORDER — SODIUM IODIDE I 131 CAPSULE
14.9000 | Freq: Once | INTRAVENOUS | Status: AC | PRN
  Administered 2018-02-01: 14.9 via ORAL

## 2018-09-16 ENCOUNTER — Other Ambulatory Visit: Payer: Self-pay | Admitting: Nurse Practitioner

## 2018-09-16 DIAGNOSIS — B181 Chronic viral hepatitis B without delta-agent: Secondary | ICD-10-CM

## 2018-09-22 ENCOUNTER — Ambulatory Visit
Admission: RE | Admit: 2018-09-22 | Discharge: 2018-09-22 | Disposition: A | Discharge status: Home / Self Care | Payer: No Typology Code available for payment source | Source: Ambulatory Visit | Attending: Nurse Practitioner | Admitting: Nurse Practitioner

## 2018-09-22 DIAGNOSIS — B181 Chronic viral hepatitis B without delta-agent: Secondary | ICD-10-CM

## 2019-03-01 ENCOUNTER — Ambulatory Visit: Payer: Self-pay | Admitting: Cardiovascular Disease

## 2019-04-05 ENCOUNTER — Telehealth: Payer: Self-pay | Admitting: Cardiovascular Disease

## 2019-04-05 NOTE — Telephone Encounter (Signed)
Spoke with pts wife regarding accompanying her husband during his appt. She denies any HOH, cognitive, or ambulatory issues with her husband. She wants to accompany him so they can both hear recommendations. I advised her of our current policy and the option to [facility] or call her during the visit so they may all be "present" during the visit. She has asked there be a special exception made to allow her in the office. I again reviewed the current office policy and at this time are not making special exceptions except for those listed above. I explained the high risk population we care for and the need to keep foot traffic down in the office as this is the the best policy to keep our high risk patient population safe. She states if she cannot come in, she will need to reschedule as a speaker phone call will not be sufficient.   I have told her I will forward her wishes to Dr. Elmarie Shiley RN to review and contact her back. She understands she may cancel and rescheduled up to 24 hours prior to pt's appointment.

## 2019-04-05 NOTE — Telephone Encounter (Signed)
  Patient has new patient appt on 10/06 with Dr Acie Fredrickson and he would like his wife to come since it is his first time with the cardiologist.

## 2019-04-08 NOTE — Telephone Encounter (Signed)
Spoke with patient's wife and advised her of the [facility] policy regarding office visits and that we are uncertain at this time how long the policy will be in place. I offered to have her listen in on the visit via telephone. She states they do not have cell phones so she would need [place] to use our phone to call her house number so that she can listen. I agreed to the plan and she was thankful for my help.

## 2019-04-12 ENCOUNTER — Ambulatory Visit (INDEPENDENT_AMBULATORY_CARE_PROVIDER_SITE_OTHER): Payer: Self-pay | Admitting: Cardiovascular Disease

## 2019-04-12 ENCOUNTER — Encounter: Payer: Self-pay | Admitting: Cardiovascular Disease

## 2019-04-12 ENCOUNTER — Other Ambulatory Visit: Payer: Self-pay

## 2019-04-12 VITALS — BP 96/78 | HR 81 | Ht 72.0 in | Wt 205.8 lb

## 2019-04-12 DIAGNOSIS — I4819 Other persistent atrial fibrillation: Secondary | ICD-10-CM

## 2019-04-12 DIAGNOSIS — I4891 Unspecified atrial fibrillation: Secondary | ICD-10-CM

## 2019-04-12 HISTORY — DX: Other persistent atrial fibrillation: I48.19

## 2019-04-12 NOTE — Progress Notes (Signed)
Cardiology Office Note:    Date:  04/12/2019   ID:  Joshua Lowe, DOB Nov 03, 1955, MRN [id number]  PCP:  Joshua Housekeeper, MD  Cardiologist:  Joshua Miss, MD  Electrophysiologist:  None   Referring MD: Joshua Housekeeper, MD   Chief Complaint  Patient presents with  . Atrial Fibrillation    History of Present Illness:    Joshua Lowe is a 63 y.o. male with a hx of atrial fib. Can occasionally feel that his HR is abn.   Talked with wife, Joshua Lowe .     Has had this for several months . Actually had palpitatins for years but they were off and on  Had hyperthyroidism Had radioactive iodine Now is on synthroid   Had hand surgery in March ,  Pre op ECG showed  Atrial fib . 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 Has some DOE walking up stairs and walking Is very fatigued in the am.  Takes an hour to "wak up "  years ago, he would wake up gasping  Does not snore according to wife.    Wife confirms that he is not as strong and fatiues easily .    Past Medical History:  Diagnosis Date  . AC (acromioclavicular) joint bone spurs   . Allergy   . 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 Anxiety   . Arthritis   . Atrial fibrillation (HCC)   . Body mass index 31.0-31.9, adult   . Cervical disc disease   . Chronic back pain   . Chronic neck pain   . Depression   . Elevated liver function tests   . Elevated transaminase level   . Eosinophilic disorder   . Graves' disease   . H/O colonoscopy 12/2008  . Heart murmur   . Hyperlipidemia   . Hyperthyroidism   . Insomnia   . Lumbar back pain   . Neuromuscular disorder (HCC)   . Osteoporosis   . Pruritus, unspecified   . Sleep disorder   . Thyroid eye disease   . 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 FISSURECTOMY    . HAND  SURGERY    . KNEE ARTHROSCOPY  1999/2007   right knee  . LIPOMA EXCISION     back  . NECK SURGERY    . SHOULDER SURGERY      Current Medications: Current Meds  Medication Sig  . diphenhydrAMINE (BENADRYL) 25 mg capsule Take 25 mg by mouth every 6 (six) hours as needed.  Marland Kitchen EPINEPHrine (EPIPEN JR) 0.15 MG/0.3ML injection Inject 0.15 mg into the muscle as needed.  Marland Kitchen HYDROcodone-acetaminophen (NORCO/VICODIN) 5-325 MG tablet Take 1 tablet by mouth every 6 (six) hours as needed for moderate pain.  Marland Kitchen ibuprofen (ADVIL) 800 MG tablet Take 800 mg by mouth 2 (two) times daily as needed.  Marland Kitchen levothyroxine (SYNTHROID) 175 MCG tablet Take 175 mcg by mouth daily before breakfast.  . traZODone (DESYREL) 150 MG tablet Take 150 mg by mouth at bedtime. 50 mg taken at bedtime  . VEMLIDY 25 MG TABS Take 1 tablet by mouth daily.  Marland Kitchen zolpidem (AMBIEN) 10 MG tablet Take 10 mg by mouth at bedtime as needed.     Allergies:   Bee venom  Social History   Socioeconomic History  . Marital status: Married    Spouse name: Not on file  . Number of children: Not on file  . Years of education: Not on file  . Highest education level: Not on file  Occupational History  . Not on file  Social Needs  . Financial resource strain: Not on file  . Food insecurity    Worry: Not on file    Inability: Not on file  . Transportation needs    Medical: Not on file    Non-medical: Not on file  Tobacco Use  . Smoking status: Former Games developermoker  . Smokeless tobacco: Former NeurosurgeonUser    Quit date: 11/24/1986  Substance and Sexual Activity  . Alcohol use: Yes    Alcohol/week: 1.0 - 2.0 standard drinks    Types: 1 - 2 Cans of beer per week  . Drug use: No  . Sexual activity: Not on file  Lifestyle  . Physical activity    Days per week: Not on file    Minutes per session: Not on file  . Stress: Not on file  Relationships  . Social Musicianconnections    Talks on phone: Not on file    Gets together: Not on file    Attends religious  service: Not on file    Active member of club or organization: Not on file    Attends meetings of clubs or organizations: Not on file    Relationship status: Not on file  Other Topics Concern  . Not on file  Social History Narrative  . Not on file     Family History: The patient's family history includes Atrial fibrillation in his mother; Cancer in his mother.  ROS:   Please see the history of present illness.     All other systems reviewed and are negative.  EKGs/Labs/Other Studies Reviewed:    The following studies were reviewed today:   EKG:     Oct. 6,   Atrial fib with HR 81   Recent Labs: No results found for requested labs within last 8760 hours.  Recent Lipid Panel No results found for: CHOL, TRIG, HDL, CHOLHDL, VLDL, LDLCALC, LDLDIRECT  Physical Exam:    VS:  BP 96/78   Pulse 81   Ht 6' (1.829 m)   Wt 205 lb 12.8 oz (93.4 kg)   SpO2 98%   BMI 27.91 kg/m     Wt Readings from Last 3 Encounters:  04/12/19 205 lb 12.8 oz (93.4 kg)  09/08/12 200 lb 9.6 oz (91 kg)  07/19/12 214 lb 6.4 oz (97.3 kg)     GEN:  Well nourished, well developed in no acute distress HEENT: Normal NECK: No JVD; No carotid bruits LYMPHATICS: No lymphadenopathy CARDIAC: irreg. Irreg.  RESPIRATORY:  Clear to auscultation without rales, wheezing or rhonchi  ABDOMEN: Soft, non-tender, non-distended MUSCULOSKELETAL:  No edema; No deformity  SKIN: Warm and dry NEUROLOGIC:  Alert and oriented x 3 PSYCHIATRIC:  Normal affect   ASSESSMENT:    1. Atrial fibrillation, unspecified type (HCC)    PLAN:    In order of problems listed above:  1. Atrial Fib:  CHADS2VASC   =  0     Would not start anticoagulation.  We discussed possibly starting an aspirin but told that aspirin therapy was not all that beneficial for atrial fibrillation and that it can cause gastritis and other illnesses.  His risk of stroke is low. We discussed possible causes.  It does  not sound like he has sleep apnea.   He does have some thyroid issues but they are being addressed currently.  He is not excessively obese.    Will get echo for further assessment of his LV function and chamber size. If his atrial size is not excessively large, we should consider doing a cardioversion.  If his atria are large then I do not think it would be all that useful to cardiovert him. Likewise will need to consider referring him to the EP clinic. He has lots of fatigue but I don't think the fatigue is caused from the Afib    Wife confirms that he is not as strong and fatiues easily .     Medication Adjustments/Labs and Tests Ordered: Current medicines are reviewed at length with the patient today.  Concerns regarding medicines are outlined above.  Orders Placed This Encounter  Procedures  . EKG 12-Lead  . ECHOCARDIOGRAM COMPLETE   No orders of the defined types were placed in this encounter.   Patient Instructions  Medication Instructions:  Your physician recommends that you continue on your current medications as directed. Please refer to the Current Medication list given to you today.  If you need a refill on your cardiac medications before your next appointment, please call your pharmacy.    Lab work: None Ordered    Testing/Procedures: Your physician has requested that you have an echocardiogram. Echocardiography is a painless test that uses sound waves to create images of your heart. It provides your doctor with information about the size and shape of your heart and how well your heart's chambers and valves are working. This procedure takes approximately one hour. There are no restrictions for this procedure.    Follow-Up: At [facility], you and your health needs are our priority.  As part of our continuing mission to provide you with exceptional heart care, we have created designated Provider Care Teams.  These Care Teams include your primary Cardiologist (physician) and Advanced Practice  Providers (APPs -  Physician Assistants and Nurse Practitioners) who all work together to provide you with the care you need, when you need it. You will need a follow up appointment in:  3 months.  Please call our office 2 months in advance to schedule this appointment.  You may see Dr. Acie Fredrickson or one of the following Advanced Practice Providers on your designated Care Team: Richardson Dopp, PA-C [place], [place] . Daune Perch, NP      Signed, Mertie Moores, MD  04/12/2019 5:53 PM    [place]

## 2019-04-12 NOTE — Patient Instructions (Signed)
Medication Instructions:  Your physician recommends that you continue on your current medications as directed. Please refer to the Current Medication list given to you today.  If you need a refill on your cardiac medications before your next appointment, please call your pharmacy.    Lab work: None Ordered    Testing/Procedures: Your physician has requested that you have an echocardiogram. Echocardiography is a painless test that uses sound waves to create images of your heart. It provides your doctor with information about the size and shape of your heart and how well your heart's chambers and valves are working. This procedure takes approximately one hour. There are no restrictions for this procedure.    Follow-Up: At [facility], you and your health needs are our priority.  As part of our continuing mission to provide you with exceptional heart care, we have created designated Provider Care Teams.  These Care Teams include your primary Cardiologist (physician) and Advanced Practice Providers (APPs -  Physician Assistants and Nurse Practitioners) who all work together to provide you with the care you need, when you need it. You will need a follow up appointment in:  3 months.  Please call our office 2 months in advance to schedule this appointment.  You may see Dr. Acie Fredrickson or one of the following Advanced Practice Providers on your designated Care Team: Richardson Dopp, PA-C [place], [place] . Daune Perch, NP

## 2019-04-15 ENCOUNTER — Other Ambulatory Visit: Payer: Self-pay

## 2019-04-15 ENCOUNTER — Ambulatory Visit (HOSPITAL_COMMUNITY): Payer: Self-pay | Attending: Cardiology

## 2019-04-15 DIAGNOSIS — I4891 Unspecified atrial fibrillation: Secondary | ICD-10-CM | POA: Insufficient documentation

## 2019-04-18 ENCOUNTER — Telehealth: Payer: Self-pay | Admitting: Cardiovascular Disease

## 2019-04-18 NOTE — Telephone Encounter (Signed)
-----   Message from Thayer Headings, MD sent at 04/18/2019  7:22 AM EDT ----- Normal LV systolic function. Could not evaluate diastolic function due to atrial fib No significant valvular disease

## 2019-04-18 NOTE — Telephone Encounter (Signed)
Reviewed results with patient and wife. I answered questions to their satisfaction. We discussed cardioversion since Dr. Acie Fredrickson advised that the echo would help determine atrial size and if cardioversion would be an option for treatment of his a fib. The patient is self-pay and requests information regarding the cost of cardioversion without insurance. I advised that I will request that information and get back in touch with them when I have additional information to share. They thanked me for the call.

## 2019-04-18 NOTE — Telephone Encounter (Signed)
New message   Per patient is returning call and has questions about echo results.

## 2019-04-19 ENCOUNTER — Telehealth: Payer: Self-pay | Admitting: Cardiovascular Disease

## 2019-04-19 NOTE — Telephone Encounter (Signed)
New message:    Patient need some one to call ASAP. Patient as some questions.

## 2019-04-19 NOTE — Telephone Encounter (Signed)
Pt and his wife called and they are very frustrated by their numerous calls to the hospital trying to find out the cost of the cardioversion he is considering. They are self pay and the billing dept at [facility] will not readily give them a total "cost" to them without orders and codes... the pt is very anxious. I have advised them I will send to our Nursing Supervisor for added help in finding the right person to give them the information they are needing.Marland KitchenMarland KitchenMarland KitchenMarland Kitchen which includes all costs such as MD charges, facility fees.   I did advise them that with procedures a final cost would be very difficult to offer them since we are not sur  exactly what he will require while there such as his sedation and other charges dependent on the actual procedure.. they were understanding but still demanded to know the "cost" he will be responsible for after the procedure is completed.   Will forward to [place] for assistance.

## 2019-04-20 NOTE — Telephone Encounter (Signed)
Cardioversion scheduled for Wed. Oct. 21 at 10:00 am with Dr. Sallyanne Kuster Left message for patient that cardioversion has been scheduled so that he can proceed with trying to determine cost. I advised him to call back with questions or concerns.

## 2019-04-20 NOTE — Telephone Encounter (Signed)
See additional telephone encounter dated 04/19/19

## 2019-04-21 NOTE — Telephone Encounter (Signed)
Patient has received information regarding the cost of the cardioversion and has agreed to proceed. Dr. Acie Fredrickson advises that he would like to discuss the potential future costs of treatment with the patient and make certain he is aware of all options. Dr. Acie Fredrickson left a message for the patient today.

## 2019-04-22 ENCOUNTER — Telehealth: Payer: Self-pay | Admitting: Cardiovascular Disease

## 2019-04-22 NOTE — Telephone Encounter (Signed)
Discussed with Joshua Lowe and [name]  He needs to have a colonoscopy so we will not start any anticoagulation until after he is through with his colon procedured.  Pt is asymptomatic,  Has had AF for at least 7 months  He may have AF longer   We discussed the fact that he does not have insurance and that cardioversions are not always 100 successful.   I wanted to make sure that they understand that his total cost to get him back in NSR may be significantly higher than his calculated cost that he has figured for a 1 time cardioversion  I have discussed with Dr. Curt Bears.   We  may consider referral for AF ablation to avoid lifelong medication therapy.  He and his wife will consider the costs and we will discuss with him in a month or so.    Mertie Moores, MD  04/22/2019 4:52 PM    [address],  [place], [place]  [address] Pager [phone] Phone: [phone]; Fax: [phone]

## 2019-04-25 NOTE — Telephone Encounter (Signed)
Cardioversion has been cancelled per patient request. See additional telephone encounter dated 10/16 from Dr. Acie Fredrickson

## 2019-04-27 ENCOUNTER — Encounter (HOSPITAL_COMMUNITY): Admission: RE | Payer: Self-pay | Source: Home / Self Care

## 2019-04-27 ENCOUNTER — Ambulatory Visit (HOSPITAL_COMMUNITY): Admission: RE | Admit: 2019-04-27 | Payer: Self-pay | Source: Home / Self Care | Admitting: Cardiovascular Disease

## 2019-04-27 SURGERY — CARDIOVERSION
Anesthesia: General

## 2019-06-23 ENCOUNTER — Encounter: Payer: Self-pay | Admitting: Internal Medicine

## 2019-07-13 ENCOUNTER — Telehealth (INDEPENDENT_AMBULATORY_CARE_PROVIDER_SITE_OTHER): Payer: Self-pay | Admitting: Internal Medicine

## 2019-07-13 ENCOUNTER — Other Ambulatory Visit: Payer: Self-pay

## 2019-07-13 ENCOUNTER — Telehealth (HOSPITAL_COMMUNITY): Payer: Self-pay | Admitting: Physician Assistant

## 2019-07-13 ENCOUNTER — Encounter: Payer: Self-pay | Admitting: Internal Medicine

## 2019-07-13 ENCOUNTER — Telehealth: Payer: Self-pay

## 2019-07-13 VITALS — Ht 72.0 in | Wt 207.0 lb

## 2019-07-13 DIAGNOSIS — I4819 Other persistent atrial fibrillation: Secondary | ICD-10-CM

## 2019-07-13 MED ORDER — RIVAROXABAN 20 MG PO TABS: 20.0000 mg | ORAL_TABLET | Freq: Every day | ORAL | 11 refills | Status: DC

## 2019-07-13 NOTE — Telephone Encounter (Signed)
Patient's wife called back.  She & patient state they were told for pt to be on meds that Dr.[name] prescribed for 4 wks before being seen in A-Fib Clinic.  She states they will have to wait for the medication to come via mail.  Pt wife advised to call us once the meds are received and we will schedule his 4 wk f/u at that time.  Pt & wife voiced understanding.

## 2019-07-13 NOTE — Telephone Encounter (Addendum)
Start xarelto 20mg  daily   Follow-up in AF clinic in 4 weeks to start metoprolol and flecainide.  Cardiovert if those dont work... they are aware of costs.  I have advised rate control but he wants to try sinus.

## 2019-07-13 NOTE — Telephone Encounter (Signed)
Called patient to schedule appt to be seen in A-Fib Clinic in 4 wks per Dr. Fonnie Birkenhead, RN.  No answer or machine.  Will call back later.

## 2019-07-13 NOTE — Progress Notes (Signed)
Electrophysiology TeleHealth Note   Due to national recommendations of social distancing due to [place] 19, Audio/video telehealth visit is felt to be most appropriate for this patient at this time.  See MyChart message from today for patient consent regarding telehealth for [facility].   Date:  07/13/2019   ID:  Joshua Lowe, DOB Nov 13, 1955, MRN [id number]  Location: home Provider location: [place] Evaluation Performed: New patient consult  PCP:  Wenda Low, MD  Cardiologist:  Mertie Moores, MD  Electrophysiologist:  None   Chief Complaint:  afib  History of Present Illness:    Joshua Lowe is a 64 y.o. male who presents via audio/video conferencing for a telehealth visit today.   The patient is referred for new consultation regarding afib by Dr Acie Fredrickson. He reports initially being diagnosed with atrial fibrillation 09/2018 after presenting for preoperative assessment for finger surgery.  He was unaware of his afib at that time.  He was seen by Dr Acie Fredrickson in October. Because of lack of insurance, he did not have cardioversion performed. He had hyperthyroid disease in 2018 for which he has been treated with RAI July 2019.   He has fatigue.  He has reduced exercise tolerance. He has rare palpitations.  He has rare dizziness. Today, he denies symptoms of chest pain, shortness of breath, orthopnea, PND, lower extremity edema, claudication,  presyncope, syncope, bleeding, or neurologic sequela. The patient is tolerating medications without difficulties and is otherwise without complaint today.   he denies symptoms of cough, fevers, chills, or new SOB worrisome for COVID 19.   Past Medical History:  Diagnosis Date  . AC (acromioclavicular) joint bone spurs   . Allergy   . 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 Anxiety   . Arthritis   . Body mass index 31.0-31.9, adult   . Cervical disc disease   . Chronic back pain   . Chronic neck pain   . Depression   . Eosinophilic  disorder   . Graves' disease   . H/O colonoscopy 12/2008  . Hyperlipidemia   . Hyperthyroidism    s/p RAI ablation  . Insomnia   . Lumbar back pain   . Neuromuscular disorder ([place])   . Osteoporosis   . Persistent atrial fibrillation ([place]) 04/12/2019  . Pruritus, unspecified   . Thyroid eye disease   . 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 FISSURECTOMY    . HAND SURGERY    . KNEE ARTHROSCOPY  1999/2007   right knee  . LIPOMA EXCISION     back  . NECK SURGERY    . SHOULDER SURGERY      Current Outpatient Medications  Medication Sig Dispense Refill  . diphenhydrAMINE (BENADRYL) 25 mg capsule Take 25 mg by mouth every 6 (six) hours as needed.    Marland Kitchen EPINEPHrine (EPIPEN JR) 0.15 MG/0.3ML injection Inject 0.15 mg into the muscle as needed.    Marland Kitchen HYDROcodone-acetaminophen (NORCO/VICODIN) 5-325 MG tablet Take 1 tablet by mouth every 6 (six) hours as needed for moderate pain.    Marland Kitchen ibuprofen (ADVIL) 800 MG tablet Take 800 mg by mouth 2 (two) times daily as needed.    Marland Kitchen levothyroxine (SYNTHROID) 175 MCG tablet Take 175 mcg by mouth daily before breakfast.    . traZODone (DESYREL) 150 MG tablet Take 150 mg by mouth at bedtime. 50 mg taken at bedtime    . VEMLIDY  25 MG TABS Take 1 tablet by mouth daily.    Marland Kitchen zolpidem (AMBIEN) 10 MG tablet Take 10 mg by mouth at bedtime as needed.    . rivaroxaban (XARELTO) 20 MG TABS tablet Take 1 tablet (20 mg total) by mouth daily with supper. 30 tablet 11   No current facility-administered medications for this visit.    Allergies:   Bee venom   Social History:  The patient  reports that he has quit smoking. He quit smokeless tobacco use about 32 years ago. He reports previous alcohol use of about 1.0 - 2.0 standard drinks of alcohol per week. He reports that he does not use drugs.   Family History:  The patient's family history includes Atrial fibrillation in his mother; Cancer in  his mother.    ROS:  Please see the history of present illness.   All other systems are personally reviewed and negative.    Exam:    Vital Signs:  Ht 6' (1.829 m)   Wt 207 lb (93.9 kg)   BMI 28.07 kg/m    Well sounding, alert and conversant    Labs/Other Tests and Data Reviewed:    Recent Labs: No results found for requested labs within last 8760 hours.   Wt Readings from Last 3 Encounters:  07/13/19 207 lb (93.9 kg)  04/12/19 205 lb 12.8 oz (93.4 kg)  09/08/12 200 lb 9.6 oz (91 kg)     Other studies personally reviewed: Additional studies/ records that were reviewed today include: Dr Namon Cirri notes, prior ekg and ekg Review of the above records today demonstrates: as above    ASSESSMENT & PLAN:    1.  Persistent atrial fibrillation The patient has symptomatic afib. He has not tried AAD.  His chads2vasc score is 0.   He has been evaluated by Dr [name] and cardioversion was advised.  He did not proceed, largely due to lack of insurance. Today, we discussed options of rate and rhythm control at length.  He would like to pursue sinus rhythm.  We discussed guidelines which advise AAD therapy prior to ablation for persistent afib.  We also discussed that costs of ablation are quite high (he has not insurance) and anticipated success rates for her are low (probably 50-60% with 1/3 chance of additional procedures required).   I have advised that he either go with long term rate control which would require no new medicines (chads2vasc score is 0).  He feels that his symptoms warrant a trial of sinus rhythm. I will therefore start xarelto 20mg  daily.  After 3 weeks of uninterupted anticoagulation, he will start flecainide 100mg  BID and metoprolol.  This will be coordinated through the afib. Clinic.  We will do our best to assist with low cost medicines though this is likely to be difficult, particularly with anticoagulation required.     Follow-up:  AF clinic in 3-4 weeks to initiate  flecainide and arrange cardioversion if he does not convert to sinus.  Current medicines are reviewed at length with the patient today.   The patient does not have concerns regarding his medicines.  The following changes were made today:  none  Labs/ tests ordered today include:  No orders of the defined types were placed in this encounter.   Patient Risk:  after full review of this patients clinical status, I feel that they are at moderate risk at this time.   Today, I have spent 25 minutes with the patient with telehealth technology discussing afib .  SignedHillis Range MD, [facility] 07/13/2019 10:08 PM   [facility] [address] [place] [address] [phone] (office) [phone] (fax)

## 2019-07-14 MED ORDER — RIVAROXABAN 20 MG PO TABS: 20.0000 mg | ORAL_TABLET | Freq: Every day | ORAL | 3 refills | Status: DC

## 2019-07-14 NOTE — Telephone Encounter (Signed)
Medication sent to [facility] as requested.

## 2019-08-18 ENCOUNTER — Ambulatory Visit (HOSPITAL_COMMUNITY)
Admission: RE | Admit: 2019-08-18 | Discharge: 2019-08-18 | Disposition: A | Discharge status: Home / Self Care | Payer: Self-pay | Source: Ambulatory Visit | Attending: Nurse Practitioner | Admitting: Nurse Practitioner

## 2019-08-18 ENCOUNTER — Encounter (HOSPITAL_COMMUNITY): Payer: Self-pay | Admitting: Nurse Practitioner

## 2019-08-18 ENCOUNTER — Other Ambulatory Visit: Payer: Self-pay

## 2019-08-18 ENCOUNTER — Other Ambulatory Visit (HOSPITAL_COMMUNITY): Payer: Self-pay | Admitting: *Deleted

## 2019-08-18 VITALS — BP 104/66 | HR 71 | Ht 72.0 in | Wt 212.0 lb

## 2019-08-18 DIAGNOSIS — E0590 Thyrotoxicosis, unspecified without thyrotoxic crisis or storm: Secondary | ICD-10-CM | POA: Insufficient documentation

## 2019-08-18 DIAGNOSIS — I4819 Other persistent atrial fibrillation: Secondary | ICD-10-CM | POA: Insufficient documentation

## 2019-08-18 DIAGNOSIS — Z7901 Long term (current) use of anticoagulants: Secondary | ICD-10-CM | POA: Insufficient documentation

## 2019-08-18 DIAGNOSIS — M542 Cervicalgia: Secondary | ICD-10-CM | POA: Insufficient documentation

## 2019-08-18 DIAGNOSIS — I351 Nonrheumatic aortic (valve) insufficiency: Secondary | ICD-10-CM | POA: Insufficient documentation

## 2019-08-18 DIAGNOSIS — Z79890 Hormone replacement therapy (postmenopausal): Secondary | ICD-10-CM | POA: Insufficient documentation

## 2019-08-18 DIAGNOSIS — G8929 Other chronic pain: Secondary | ICD-10-CM | POA: Insufficient documentation

## 2019-08-18 DIAGNOSIS — E785 Hyperlipidemia, unspecified: Secondary | ICD-10-CM | POA: Insufficient documentation

## 2019-08-18 DIAGNOSIS — M810 Age-related osteoporosis without current pathological fracture: Secondary | ICD-10-CM | POA: Insufficient documentation

## 2019-08-18 DIAGNOSIS — M549 Dorsalgia, unspecified: Secondary | ICD-10-CM | POA: Insufficient documentation

## 2019-08-18 DIAGNOSIS — M1990 Unspecified osteoarthritis, unspecified site: Secondary | ICD-10-CM | POA: Insufficient documentation

## 2019-08-18 DIAGNOSIS — F419 Anxiety disorder, unspecified: Secondary | ICD-10-CM | POA: Insufficient documentation

## 2019-08-18 DIAGNOSIS — Z79899 Other long term (current) drug therapy: Secondary | ICD-10-CM | POA: Insufficient documentation

## 2019-08-18 DIAGNOSIS — D6869 Other thrombophilia: Secondary | ICD-10-CM

## 2019-08-18 MED ORDER — FLECAINIDE ACETATE 100 MG PO TABS: 100.0000 mg | ORAL_TABLET | Freq: Two times a day (BID) | ORAL | 3 refills | Status: DC

## 2019-08-18 MED ORDER — METOPROLOL SUCCINATE ER 25 MG PO TB24: 12.5000 mg | ORAL_TABLET | Freq: Every day | ORAL | 3 refills | Status: DC

## 2019-08-18 NOTE — Progress Notes (Signed)
Primary Care Physician: Wenda Low, MD Referring Physician: Dr. Rayann Lowe  Cardiologist: Joshua Lowe is a 64 y.o. male with a h/o Graves's disease, s/p RAI ablation, persistent afib since spring of 2020.  He recently saw Joshua Lowe virtually and he suggested starting  xarelto 20 mg daily and planning on cardioversion. He has been on xarelto 20 mg daily since 07/14/19. He does have a rash on his forearms but is not sure if coming from  [place]. He does have dry skin in the winter. He is self employed, no drug insurance , and does not want to change xarelto now as he got a 90 day supply.   He does not drink alcohol, no tobacco, some caffeine, pending a sleep study with Joshua Lowe. Pt does c/o of fatigue since he has been in afib.   Joshua Lowe recommendation was to start flecainide + BB at this visit after 3 weeks on anticoagulation followed by cardioversion if drug does not convert [pt.   Today, he denies symptoms of palpitations, chest pain, shortness of breath, orthopnea, PND, lower extremity edema, dizziness, presyncope, syncope, or neurologic sequela. The patient is tolerating medications without difficulties and is otherwise without complaint today.   Past Medical History:  Diagnosis Date  . AC (acromioclavicular) joint bone spurs   . Allergy   . 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 Anxiety   . Arthritis   . Body mass index 31.0-31.9, adult   . Cervical disc disease   . Chronic back pain   . Chronic neck pain   . Depression   . Eosinophilic disorder   . Graves' disease   . H/O colonoscopy 12/2008  . Hyperlipidemia   . Hyperthyroidism    s/p RAI ablation  . Insomnia   . Lumbar back pain   . Neuromuscular disorder ([place])   . Osteoporosis   . Persistent atrial fibrillation ([place]) 04/12/2019  . Pruritus, unspecified   . Thyroid eye disease   . 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  FISSURECTOMY    . HAND SURGERY    . KNEE ARTHROSCOPY  1999/2007   right knee  . LIPOMA EXCISION     back  . NECK SURGERY    . SHOULDER SURGERY      Current Outpatient Medications  Medication Sig Dispense Refill  . diphenhydrAMINE (BENADRYL) 25 mg capsule Take 25 mg by mouth as needed.     Marland Kitchen EPINEPHrine (EPIPEN JR) 0.15 MG/0.3ML injection Inject 0.15 mg into the muscle as needed.    Marland Kitchen HYDROcodone-acetaminophen (NORCO/VICODIN) 5-325 MG tablet Take 1 tablet by mouth 2 (two) times daily.     Marland Kitchen ibuprofen (ADVIL) 800 MG tablet Take 800 mg by mouth as needed.     Marland Kitchen levothyroxine (SYNTHROID) 175 MCG tablet Take 175 mcg by mouth daily before breakfast.    . rivaroxaban (XARELTO) 20 MG TABS tablet Take 1 tablet (20 mg total) by mouth daily with supper. 90 tablet 3  . rivaroxaban (XARELTO) 20 MG TABS tablet Take 1 tablet (20 mg total) by mouth daily with supper. 90 tablet 3  . traZODone (DESYREL) 150 MG tablet Take 150 mg by mouth at bedtime. 50 mg taken at bedtime    . VEMLIDY 25 MG TABS Take 1 tablet by mouth daily.    Marland Kitchen zolpidem (AMBIEN) 10 MG tablet Take 10 mg by mouth at bedtime as needed.    Marland Kitchen  flecainide (TAMBOCOR) 100 MG tablet Take 1 tablet (100 mg total) by mouth 2 (two) times daily. 60 tablet 3  . metoprolol succinate (TOPROL XL) 25 MG 24 hr tablet Take 0.5 tablets (12.5 mg total) by mouth at bedtime. 30 tablet 3   No current facility-administered medications for this encounter.    Allergies  Allergen Reactions  . Bee Venom     Social History   Socioeconomic History  . Marital status: Married    Spouse name: Not on file  . Number of children: Not on file  . Years of education: Not on file  . Highest education level: Not on file  Occupational History  . Not on file  Tobacco Use  . Smoking status: Never Smoker  . Smokeless tobacco: Never Used  Substance and Sexual Activity  . Alcohol use: Not Currently    Alcohol/week: 1.0 - 2.0 standard drinks    Types: 1 - 2 Cans of  beer per week  . Drug use: No  . Sexual activity: Not on file  Other Topics Concern  . Not on file  Social History Narrative   Lives in [place] with spouse.   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 Difficulty of Paying Living Expenses: Not on file  Food Insecurity:   . Worried About Programme researcher, broadcasting/film/video in the Last Year: Not on file  . Ran Out of Food in the Last Year: Not on file  Transportation Needs:   . Lack of Transportation (Medical): Not on file  . Lack of Transportation (Non-Medical): Not on file  Physical Activity:   . Days of Exercise per Week: Not on file  . Minutes of Exercise per Session: Not on file  Stress:   . Feeling of Stress : Not on file  Social Connections:   . Frequency of Communication with Friends and Family: Not on file  . Frequency of Social Gatherings with Friends and Family: Not on file  . Attends Religious Services: Not on file  . Active Member of Clubs or Organizations: Not on file  . Attends Banker Meetings: Not on file  . Marital Status: Not on file  Intimate Partner Violence:   . Fear of Current or Ex-Partner: Not on file  . Emotionally Abused: Not on file  . Physically Abused: Not on file  . Sexually Abused: Not on file    Family History  Problem Relation Age of Onset  . Cancer Mother        skin  . Atrial fibrillation Mother     ROS- All systems are reviewed and negative except as per the HPI above  Physical Exam: Vitals:   08/18/19 1335  BP: 104/66  Pulse: 71  Weight: 96.2 kg  Height: 6' (1.829 m)   Wt Readings from Last 3 Encounters:  08/18/19 96.2 kg  07/13/19 93.9 kg  04/12/19 93.4 kg    Labs: No results found for: NA, K, CL, CO2, GLUCOSE, BUN, CREATININE, CALCIUM, PHOS, MG No results found for: INR No results found for: CHOL, HDL, LDLCALC, TRIG   GEN- The patient is well appearing, alert and oriented x 3 today.   Head-  normocephalic, atraumatic Eyes-  Sclera clear, conjunctiva pink Ears- hearing intact Oropharynx- clear Neck- supple, no JVP Lymph- no cervical lymphadenopathy Lungs- Clear to ausculation bilaterally, normal work of breathing Heart- Regular rate and rhythm, no murmurs, rubs or gallops, PMI not laterally  displaced GI- soft, NT, ND, + BS Extremities- no clubbing, cyanosis, or edema MS- no significant deformity or atrophy Skin- no rash or lesion Psych- euthymic mood, full affect Neuro- strength and sensation are intact  EKG-afib at 71 bpm. qrs int 90 ms, qtc 417 ms Echo-IMPRESSIONS    1. Left ventricular ejection fraction, by visual estimation, is 60 to  65%. The left ventricle has normal function. Normal left ventricular size.  There is no left ventricular hypertrophy.  2. Left ventricular diastolic function could not be evaluated pattern of  LV diastolic filling.  3. Global right ventricle has normal systolic function.The right  ventricular size is normal. No increase in right ventricular wall  thickness.  4. Left atrial size was normal.  5. Right atrial size was normal.  6. The mitral valve is normal in structure. Trace mitral valve  regurgitation. No evidence of mitral stenosis.  7. The tricuspid valve is normal in structure. Tricuspid valve  regurgitation is trivial.  8. The aortic valve is tricuspid Aortic valve regurgitation is moderate  by color flow Doppler. Structurally normal aortic valve, with no evidence  of sclerosis or stenosis.  9. The pulmonic valve was grossly normal. Pulmonic valve regurgitation is  trivial by color flow Doppler.  10. Mildly elevated pulmonary artery systolic pressure.  11. The inferior vena cava is dilated in size with <50% respiratory  variability, suggesting right atrial pressure of 15 mmHg.   FINDINGS  Left Ventricle: Left ventricular ejection fraction, by visual estimation,  is 60 to 65%. The left ventricle has normal  function. No evidence of left  ventricular regional wall motion abnormalities. There is no left  ventricular hypertrophy. Normal left  ventricular size. Spectral Doppler shows Left ventricular diastolic  function could not be evaluated pattern of LV diastolic filling.     Assessment and Plan: 1. Persistent symptomatic  afib since March 2020 General education re afib  Informed of procedure of cardioversion, risk vrs benefit and he and his wife want to proceed Will start metoprolol succinate 12.5 mg Sunday pm and flecainide 100 mg bid on  Monday, per Dr. Jenel Lucks plan ( no CAD h/o and no current anginal symptoms) He will return Wednesday pm for EKG. If drug does not convert pt, will set up for cardioversion and get labs/covid at that time   2. CHA2DS2VASc score of 0 He is currently taking xarleto 20 mg daily, hopefully will not require anticoagulatiion long term   Lupita Leash C. Matthew Fol[facility] [address] [place], [place] [address] [phone]

## 2019-08-18 NOTE — Patient Instructions (Signed)
On Sunday evening - start metoprolol 1/2 tablet once a day  On Monday morning - start flecainide 100mg  twice a day

## 2019-08-24 ENCOUNTER — Other Ambulatory Visit: Payer: Self-pay

## 2019-08-24 ENCOUNTER — Ambulatory Visit (HOSPITAL_COMMUNITY)
Admission: RE | Admit: 2019-08-24 | Discharge: 2019-08-24 | Disposition: A | Discharge status: Home / Self Care | Payer: Self-pay | Source: Ambulatory Visit | Attending: Nurse Practitioner | Admitting: Nurse Practitioner

## 2019-08-24 ENCOUNTER — Other Ambulatory Visit (HOSPITAL_COMMUNITY): Payer: Self-pay | Admitting: *Deleted

## 2019-08-24 ENCOUNTER — Encounter (HOSPITAL_COMMUNITY): Payer: Self-pay | Admitting: Nurse Practitioner

## 2019-08-24 DIAGNOSIS — E0590 Thyrotoxicosis, unspecified without thyrotoxic crisis or storm: Secondary | ICD-10-CM | POA: Insufficient documentation

## 2019-08-24 DIAGNOSIS — Z79890 Hormone replacement therapy (postmenopausal): Secondary | ICD-10-CM | POA: Insufficient documentation

## 2019-08-24 DIAGNOSIS — E785 Hyperlipidemia, unspecified: Secondary | ICD-10-CM | POA: Insufficient documentation

## 2019-08-24 DIAGNOSIS — Z7901 Long term (current) use of anticoagulants: Secondary | ICD-10-CM | POA: Insufficient documentation

## 2019-08-24 DIAGNOSIS — Z79899 Other long term (current) drug therapy: Secondary | ICD-10-CM | POA: Insufficient documentation

## 2019-08-24 DIAGNOSIS — I4819 Other persistent atrial fibrillation: Secondary | ICD-10-CM | POA: Insufficient documentation

## 2019-08-24 LAB — BASIC METABOLIC PANEL
Anion gap: 7 (ref 5–15)
BUN: 12 mg/dL (ref 8–23)
CO2: 28 mmol/L (ref 22–32)
Calcium: 9.4 mg/dL (ref 8.9–10.3)
Chloride: 101 mmol/L (ref 98–111)
Creatinine, Ser: 0.92 mg/dL (ref 0.61–1.24)
GFR calc Af Amer: >60 mL/min (ref >60)
GFR calc non Af Amer: >60 mL/min (ref >60)
Glucose, Bld: 120 mg/dL — ABNORMAL HIGH (ref 70–99)
Potassium: 4.5 mmol/L (ref 3.5–5.1)
Sodium: 136 mmol/L (ref 135–145)

## 2019-08-24 LAB — CBC
HCT: 46.2 % (ref 39.0–52.0)
Hemoglobin: 15.2 g/dL (ref 13.0–17.0)
MCH: 31 pg (ref 26.0–34.0)
MCHC: 32.9 g/dL (ref 30.0–36.0)
MCV: 94.3 fL (ref 80.0–100.0)
Platelets: 221 10*3/uL (ref 150–400)
RBC: 4.9 MIL/uL (ref 4.22–5.81)
RDW: 13.9 % (ref 11.5–15.5)
WBC: 5.6 10*3/uL (ref 4.0–10.5)
nRBC: 0 % (ref 0.0–0.2)

## 2019-08-24 NOTE — Patient Instructions (Signed)
Cardioversion scheduled for Thursday, February 25th  - Arrive at the [facility] and go to admitting at 10:30AM  -Do not eat or drink anything after midnight the night prior to your procedure.  - Take all your morning medication with a sip of water prior to arrival.  - You will not be able to drive home after your procedure.

## 2019-08-24 NOTE — H&P (View-Only) (Signed)
Primary Care Physician: Georgann Housekeeper, MD Referring Physician: Dr. Johney Lowe  Cardiologist: Joshua Lowe is a 64 y.o. male with a h/o Graves's disease, s/p RAI ablation, persistent afib since spring of 2020.  He recently saw Dr. Johney Lowe virtually and he suggested starting  xarelto 20 mg daily and planning on cardioversion. He has been on xarelto 20 mg daily since 07/14/19. He does have a rash on his forearms but is not sure if coming from  xarelto. He does have dry skin in the winter. He is self employed, no drug insurance , and does not want to change xarelto now as he got a 90 day supply.   He does not drink alcohol, no tobacco, some caffeine, pending a sleep study with Dr. Earl Gala. Pt does c/o of fatigue since he has been in afib.   Dr. Jenel Lucks recommendation was to start flecainide + BB at this visit after 3 weeks on anticoagulation followed by cardioversion if drug does not convert [pt.   Today, he denies symptoms of palpitations, chest pain, shortness of breath, orthopnea, PND, lower extremity edema, dizziness, presyncope, syncope, or neurologic sequela. The patient is tolerating medications without difficulties and is otherwise without complaint today.   Past Medical History:  Diagnosis Date  . AC (acromioclavicular) joint bone spurs   . Allergy   . 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 Anxiety   . Arthritis   . Body mass index 31.0-31.9, adult   . Cervical disc disease   . Chronic back pain   . Chronic neck pain   . Depression   . Eosinophilic disorder   . Graves' disease   . H/O colonoscopy 12/2008  . Hyperlipidemia   . Hyperthyroidism    s/p RAI ablation  . Insomnia   . Lumbar back pain   . Neuromuscular disorder (HCC)   . Osteoporosis   . Persistent atrial fibrillation (HCC) 04/12/2019  . Pruritus, unspecified   . Thyroid eye disease   . 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  FISSURECTOMY    . HAND SURGERY    . KNEE ARTHROSCOPY  1999/2007   right knee  . LIPOMA EXCISION     back  . NECK SURGERY    . SHOULDER SURGERY      Current Outpatient Medications  Medication Sig Dispense Refill  . diphenhydrAMINE (BENADRYL) 25 mg capsule Take 25 mg by mouth as needed.     Marland Kitchen EPINEPHrine (EPIPEN JR) 0.15 MG/0.3ML injection Inject 0.15 mg into the muscle as needed.    . flecainide (TAMBOCOR) 100 MG tablet Take 1 tablet (100 mg total) by mouth 2 (two) times daily. 60 tablet 3  . HYDROcodone-acetaminophen (NORCO/VICODIN) 5-325 MG tablet Take 1 tablet by mouth 2 (two) times daily.     Marland Kitchen ibuprofen (ADVIL) 800 MG tablet Take 800 mg by mouth as needed.     Marland Kitchen levothyroxine (SYNTHROID) 175 MCG tablet Take 175 mcg by mouth daily before breakfast.    . metoprolol succinate (TOPROL XL) 25 MG 24 hr tablet Take 0.5 tablets (12.5 mg total) by mouth at bedtime. 30 tablet 3  . rivaroxaban (XARELTO) 20 MG TABS tablet Take 1 tablet (20 mg total) by mouth daily with supper. 90 tablet 3  . traZODone (DESYREL) 150 MG tablet Take 150 mg by mouth at bedtime. 50 mg taken at bedtime    . VEMLIDY 25 MG TABS Take  1 tablet by mouth daily.    Marland Kitchen zolpidem (AMBIEN) 10 MG tablet Take 10 mg by mouth at bedtime as needed.     No current facility-administered medications for this encounter.    Allergies  Allergen Reactions  . Bee Venom     Social History   Socioeconomic History  . Marital status: Married    Spouse name: Not on file  . Number of children: Not on file  . Years of education: Not on file  . Highest education level: Not on file  Occupational History  . Not on file  Tobacco Use  . Smoking status: Never Smoker  . Smokeless tobacco: Never Used  Substance and Sexual Activity  . Alcohol use: Not Currently    Alcohol/week: 1.0 - 2.0 standard drinks    Types: 1 - 2 Cans of beer per week  . Drug use: No  . Sexual activity: Not on file  Other Topics Concern  . Not on file  Social  History Narrative   Lives in [place] with spouse.   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 Difficulty of Paying Living Expenses: Not on file  Food Insecurity:   . Worried About Programme researcher, broadcasting/film/video in the Last Year: Not on file  . Ran Out of Food in the Last Year: Not on file  Transportation Needs:   . Lack of Transportation (Medical): Not on file  . Lack of Transportation (Non-Medical): Not on file  Physical Activity:   . Days of Exercise per Week: Not on file  . Minutes of Exercise per Session: Not on file  Stress:   . Feeling of Stress : Not on file  Social Connections:   . Frequency of Communication with Friends and Family: Not on file  . Frequency of Social Gatherings with Friends and Family: Not on file  . Attends Religious Services: Not on file  . Active Member of Clubs or Organizations: Not on file  . Attends Banker Meetings: Not on file  . Marital Status: Not on file  Intimate Partner Violence:   . Fear of Current or Ex-Partner: Not on file  . Emotionally Abused: Not on file  . Physically Abused: Not on file  . Sexually Abused: Not on file    Family History  Problem Relation Age of Onset  . Cancer Mother        skin  . Atrial fibrillation Mother     ROS- All systems are reviewed and negative except as per the HPI above  Physical Exam: There were no vitals filed for this visit. Wt Readings from Last 3 Encounters:  08/18/19 96.2 kg  07/13/19 93.9 kg  04/12/19 93.4 kg    Labs: No results found for: NA, K, CL, CO2, GLUCOSE, BUN, CREATININE, CALCIUM, PHOS, MG No results found for: INR No results found for: CHOL, HDL, LDLCALC, TRIG   GEN- The patient is well appearing, alert and oriented x 3 today.   Head- normocephalic, atraumatic Eyes-  Sclera clear, conjunctiva pink Ears- hearing intact Oropharynx- clear Neck- supple, no JVP Lymph- no cervical lymphadenopathy Lungs-  Clear to ausculation bilaterally, normal work of breathing Heart- Regular rate and rhythm, no murmurs, rubs or gallops, PMI not laterally displaced GI- soft, NT, ND, + BS Extremities- no clubbing, cyanosis, or edema MS- no significant deformity or atrophy Skin- no rash or lesion Psych- euthymic mood, full affect Neuro- strength and sensation  are intact  EKG-afib at 71 bpm. qrs int 90 ms, qtc 417 ms Echo-IMPRESSIONS    1. Left ventricular ejection fraction, by visual estimation, is 60 to  65%. The left ventricle has normal function. Normal left ventricular size.  There is no left ventricular hypertrophy.  2. Left ventricular diastolic function could not be evaluated pattern of  LV diastolic filling.  3. Global right ventricle has normal systolic function.The right  ventricular size is normal. No increase in right ventricular wall  thickness.  4. Left atrial size was normal.  5. Right atrial size was normal.  6. The mitral valve is normal in structure. Trace mitral valve  regurgitation. No evidence of mitral stenosis.  7. The tricuspid valve is normal in structure. Tricuspid valve  regurgitation is trivial.  8. The aortic valve is tricuspid Aortic valve regurgitation is moderate  by color flow Doppler. Structurally normal aortic valve, with no evidence  of sclerosis or stenosis.  9. The pulmonic valve was grossly normal. Pulmonic valve regurgitation is  trivial by color flow Doppler.  10. Mildly elevated pulmonary artery systolic pressure.  11. The inferior vena cava is dilated in size with <50% respiratory  variability, suggesting right atrial pressure of 15 mmHg.   FINDINGS  Left Ventricle: Left ventricular ejection fraction, by visual estimation,  is 60 to 65%. The left ventricle has normal function. No evidence of left  ventricular regional wall motion abnormalities. There is no left  ventricular hypertrophy. Normal left  ventricular size. Spectral Doppler shows  Left ventricular diastolic  function could not be evaluated pattern of LV diastolic filling.     Assessment and Plan: 1. Persistent symptomatic  afib since March 2020 General education re afib  Informed of procedure of cardioversion, risk vrs benefit and he and his wife want to proceed Will start metoprolol succinate 12.5 mg Sunday pm and flecainide 100 mg bid on  Monday, per Dr. Jackalyn Lombard plan ( no CAD h/o and no current anginal symptoms) He will return Wednesday pm for EKG. If drug does not convert pt, will set up for cardioversion and get labs/covid at that time   2. CHA2DS2VASc score of 0 He is currently taking xarleto 20 mg daily, hopefully will not require anticoagulatiion long term   Addendum: 08/26/19- pt is here for f/u of starting flecainide 100 mg bid and metoprolol 12.5 mg qd. EKg remains at  afib at 77 bpm,will plan on cardioversion. CBC/bmet today. He reports no missed doses of xarelto  since starting 07/14/19.   Geroge Baseman Yana Schorr, [facility] [address] [place], [place] [address] [phone]

## 2019-08-24 NOTE — Progress Notes (Signed)
 Primary Care Physician: Husain, Karrar, MD Referring Physician: Dr. [name]  Cardiologist: Dr. Nasher    Joshua Lowe is a 63 y.o. male with a h/o Graves's disease, s/p RAI ablation, persistent afib since spring of 2020.  He recently saw Dr. [name] virtually and he suggested starting  xarelto 20 mg daily and planning on cardioversion. He has been on xarelto 20 mg daily since 07/14/19. He does have a rash on his forearms but is not sure if coming from  xarelto. He does have dry skin in the winter. He is self employed, no drug insurance , and does not want to change xarelto now as he got a 90 day supply.   He does not drink alcohol, no tobacco, some caffeine, pending a sleep study with Dr. Osborne. Pt does c/o of fatigue since he has been in afib.   Dr. [name]'s recommendation was to start flecainide + BB at this visit after 3 weeks on anticoagulation followed by cardioversion if drug does not convert [pt.   Today, he denies symptoms of palpitations, chest pain, shortness of breath, orthopnea, PND, lower extremity edema, dizziness, presyncope, syncope, or neurologic sequela. The patient is tolerating medications without difficulties and is otherwise without complaint today.   Past Medical History:  Diagnosis Date  . AC (acromioclavicular) joint bone spurs   . Allergy   . 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 Anxiety   . Arthritis   . Body mass index 31.0-31.9, adult   . Cervical disc disease   . Chronic back pain   . Chronic neck pain   . Depression   . Eosinophilic disorder   . Graves' disease   . H/O colonoscopy 12/2008  . Hyperlipidemia   . Hyperthyroidism    s/p RAI ablation  . Insomnia   . Lumbar back pain   . Neuromuscular disorder (HCC)   . Osteoporosis   . Persistent atrial fibrillation (HCC) 04/12/2019  . Pruritus, unspecified   . Thyroid eye disease   . 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  FISSURECTOMY    . HAND SURGERY    . KNEE ARTHROSCOPY  1999/2007   right knee  . LIPOMA EXCISION     back  . NECK SURGERY    . SHOULDER SURGERY      Current Outpatient Medications  Medication Sig Dispense Refill  . diphenhydrAMINE (BENADRYL) 25 mg capsule Take 25 mg by mouth as needed.     . EPINEPHrine (EPIPEN JR) 0.15 MG/0.3ML injection Inject 0.15 mg into the muscle as needed.    . flecainide (TAMBOCOR) 100 MG tablet Take 1 tablet (100 mg total) by mouth 2 (two) times daily. 60 tablet 3  . HYDROcodone-acetaminophen (NORCO/VICODIN) 5-325 MG tablet Take 1 tablet by mouth 2 (two) times daily.     . ibuprofen (ADVIL) 800 MG tablet Take 800 mg by mouth as needed.     . levothyroxine (SYNTHROID) 175 MCG tablet Take 175 mcg by mouth daily before breakfast.    . metoprolol succinate (TOPROL XL) 25 MG 24 hr tablet Take 0.5 tablets (12.5 mg total) by mouth at bedtime. 30 tablet 3  . rivaroxaban (XARELTO) 20 MG TABS tablet Take 1 tablet (20 mg total) by mouth daily with supper. 90 tablet 3  . traZODone (DESYREL) 150 MG tablet Take 150 mg by mouth at bedtime. 50 mg taken at bedtime    . VEMLIDY 25 MG TABS Take   1 tablet by mouth daily.    Marland Kitchen zolpidem (AMBIEN) 10 MG tablet Take 10 mg by mouth at bedtime as needed.     No current facility-administered medications for this encounter.    Allergies  Allergen Reactions  . Bee Venom     Social History   Socioeconomic History  . Marital status: Married    Spouse name: Not on file  . Number of children: Not on file  . Years of education: Not on file  . Highest education level: Not on file  Occupational History  . Not on file  Tobacco Use  . Smoking status: Never Smoker  . Smokeless tobacco: Never Used  Substance and Sexual Activity  . Alcohol use: Not Currently    Alcohol/week: 1.0 - 2.0 standard drinks    Types: 1 - 2 Cans of beer per week  . Drug use: No  . Sexual activity: Not on file  Other Topics Concern  . Not on file  Social  History Narrative   Lives in [place] with spouse.   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 Difficulty of Paying Living Expenses: Not on file  Food Insecurity:   . Worried About Programme researcher, broadcasting/film/video in the Last Year: Not on file  . Ran Out of Food in the Last Year: Not on file  Transportation Needs:   . Lack of Transportation (Medical): Not on file  . Lack of Transportation (Non-Medical): Not on file  Physical Activity:   . Days of Exercise per Week: Not on file  . Minutes of Exercise per Session: Not on file  Stress:   . Feeling of Stress : Not on file  Social Connections:   . Frequency of Communication with Friends and Family: Not on file  . Frequency of Social Gatherings with Friends and Family: Not on file  . Attends Religious Services: Not on file  . Active Member of Clubs or Organizations: Not on file  . Attends Banker Meetings: Not on file  . Marital Status: Not on file  Intimate Partner Violence:   . Fear of Current or Ex-Partner: Not on file  . Emotionally Abused: Not on file  . Physically Abused: Not on file  . Sexually Abused: Not on file    Family History  Problem Relation Age of Onset  . Cancer Mother        skin  . Atrial fibrillation Mother     ROS- All systems are reviewed and negative except as per the HPI above  Physical Exam: There were no vitals filed for this visit. Wt Readings from Last 3 Encounters:  08/18/19 96.2 kg  07/13/19 93.9 kg  04/12/19 93.4 kg    Labs: No results found for: NA, K, CL, CO2, GLUCOSE, BUN, CREATININE, CALCIUM, PHOS, MG No results found for: INR No results found for: CHOL, HDL, LDLCALC, TRIG   GEN- The patient is well appearing, alert and oriented x 3 today.   Head- normocephalic, atraumatic Eyes-  Sclera clear, conjunctiva pink Ears- hearing intact Oropharynx- clear Neck- supple, no JVP Lymph- no cervical lymphadenopathy Lungs-  Clear to ausculation bilaterally, normal work of breathing Heart- Regular rate and rhythm, no murmurs, rubs or gallops, PMI not laterally displaced GI- soft, NT, ND, + BS Extremities- no clubbing, cyanosis, or edema MS- no significant deformity or atrophy Skin- no rash or lesion Psych- euthymic mood, full affect Neuro- strength and sensation  are intact  EKG-afib at 71 bpm. qrs int 90 ms, qtc 417 ms Echo-IMPRESSIONS    1. Left ventricular ejection fraction, by visual estimation, is 60 to  65%. The left ventricle has normal function. Normal left ventricular size.  There is no left ventricular hypertrophy.  2. Left ventricular diastolic function could not be evaluated pattern of  LV diastolic filling.  3. Global right ventricle has normal systolic function.The right  ventricular size is normal. No increase in right ventricular wall  thickness.  4. Left atrial size was normal.  5. Right atrial size was normal.  6. The mitral valve is normal in structure. Trace mitral valve  regurgitation. No evidence of mitral stenosis.  7. The tricuspid valve is normal in structure. Tricuspid valve  regurgitation is trivial.  8. The aortic valve is tricuspid Aortic valve regurgitation is moderate  by color flow Doppler. Structurally normal aortic valve, with no evidence  of sclerosis or stenosis.  9. The pulmonic valve was grossly normal. Pulmonic valve regurgitation is  trivial by color flow Doppler.  10. Mildly elevated pulmonary artery systolic pressure.  11. The inferior vena cava is dilated in size with <50% respiratory  variability, suggesting right atrial pressure of 15 mmHg.   FINDINGS  Left Ventricle: Left ventricular ejection fraction, by visual estimation,  is 60 to 65%. The left ventricle has normal function. No evidence of left  ventricular regional wall motion abnormalities. There is no left  ventricular hypertrophy. Normal left  ventricular size. Spectral Doppler shows  Left ventricular diastolic  function could not be evaluated pattern of LV diastolic filling.     Assessment and Plan: 1. Persistent symptomatic  afib since March 2020 General education re afib  Informed of procedure of cardioversion, risk vrs benefit and he and his wife want to proceed Will start metoprolol succinate 12.5 mg Sunday pm and flecainide 100 mg bid on  Monday, per Dr. Jackalyn Lombard plan ( no CAD h/o and no current anginal symptoms) He will return Wednesday pm for EKG. If drug does not convert pt, will set up for cardioversion and get labs/covid at that time   2. CHA2DS2VASc score of 0 He is currently taking xarleto 20 mg daily, hopefully will not require anticoagulatiion long term   Addendum: 08/26/19- pt is here for f/u of starting flecainide 100 mg bid and metoprolol 12.5 mg qd. EKg remains at  afib at 77 bpm,will plan on cardioversion. CBC/bmet today. He reports no missed doses of xarelto  since starting 07/14/19.   Geroge Baseman Carroll, [facility] [address] [place], [place] [address] [phone]

## 2019-08-29 ENCOUNTER — Other Ambulatory Visit (HOSPITAL_COMMUNITY)
Admission: RE | Admit: 2019-08-29 | Discharge: 2019-08-29 | Disposition: A | Discharge status: Home / Self Care | Payer: HRSA Program | Source: Ambulatory Visit | Attending: Cardiovascular Disease | Admitting: Cardiovascular Disease

## 2019-08-29 DIAGNOSIS — Z01812 Encounter for preprocedural laboratory examination: Secondary | ICD-10-CM | POA: Diagnosis present

## 2019-08-29 DIAGNOSIS — Z20822 Contact with and (suspected) exposure to covid-19: Secondary | ICD-10-CM | POA: Diagnosis not present

## 2019-08-29 LAB — SARS CORONAVIRUS 2 (TAT 6-24 HRS): SARS Coronavirus 2: NEGATIVE

## 2019-08-31 NOTE — Anesthesia Preprocedure Evaluation (Addendum)
Anesthesia Evaluation  Patient identified by MRN, date of birth, ID band Patient awake    Reviewed: Allergy & Precautions, NPO status , Patient's Chart, lab work & pertinent test results  Airway Mallampati: II  TM Distance: >3 FB Neck ROM: Full    Dental no notable dental hx. (+) Teeth Intact, Dental Advisory Given   Pulmonary neg pulmonary ROS,    Pulmonary exam normal breath sounds clear to auscultation       Cardiovascular Exercise Tolerance: Good Normal cardiovascular exam+ dysrhythmias Atrial Fibrillation  Rhythm:Irregular Rate:Abnormal  EF 60-65%   Neuro/Psych negative neurological ROS     GI/Hepatic negative GI ROS,   Endo/Other  Hyperthyroidism Hx of graves dz  Renal/GU negative Renal ROS     Musculoskeletal   Abdominal   Peds  Hematology negative hematology ROS (+)   Anesthesia Other Findings   Reproductive/Obstetrics                            Anesthesia Physical Anesthesia Plan  ASA: II  Anesthesia Plan: General   Post-op Pain Management:    Induction: Intravenous  PONV Risk Score and Plan: Treatment may vary due to age or medical condition  Airway Management Planned: Nasal Cannula and Natural Airway  Additional Equipment: None  Intra-op Plan:   Post-operative Plan:   Informed Consent: I have reviewed the patients History and Physical, chart, labs and discussed the procedure including the risks, benefits and alternatives for the proposed anesthesia with the patient or authorized representative who has indicated his/her understanding and acceptance.     Dental advisory given  Plan Discussed with: CRNA  Anesthesia Plan Comments:        Anesthesia Quick Evaluation

## 2019-09-01 ENCOUNTER — Ambulatory Visit (HOSPITAL_COMMUNITY): Payer: Self-pay | Admitting: Anesthesiology

## 2019-09-01 ENCOUNTER — Other Ambulatory Visit: Payer: Self-pay

## 2019-09-01 ENCOUNTER — Ambulatory Visit (HOSPITAL_COMMUNITY)
Admission: RE | Admit: 2019-09-01 | Discharge: 2019-09-01 | Disposition: A | Discharge status: Home / Self Care | Payer: Self-pay | Attending: Cardiovascular Disease | Admitting: Cardiovascular Disease

## 2019-09-01 ENCOUNTER — Encounter (HOSPITAL_COMMUNITY): Payer: Self-pay | Admitting: Cardiovascular Disease

## 2019-09-01 ENCOUNTER — Encounter (HOSPITAL_COMMUNITY)
Admission: RE | Disposition: A | Discharge status: Home / Self Care | Payer: Self-pay | Source: Home / Self Care | Attending: Cardiovascular Disease

## 2019-09-01 DIAGNOSIS — Z79890 Hormone replacement therapy (postmenopausal): Secondary | ICD-10-CM | POA: Insufficient documentation

## 2019-09-01 DIAGNOSIS — E0590 Thyrotoxicosis, unspecified without thyrotoxic crisis or storm: Secondary | ICD-10-CM | POA: Insufficient documentation

## 2019-09-01 DIAGNOSIS — G4700 Insomnia, unspecified: Secondary | ICD-10-CM | POA: Insufficient documentation

## 2019-09-01 DIAGNOSIS — Z79899 Other long term (current) drug therapy: Secondary | ICD-10-CM | POA: Insufficient documentation

## 2019-09-01 DIAGNOSIS — B191 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: Secondary | ICD-10-CM | POA: Insufficient documentation

## 2019-09-01 DIAGNOSIS — I4819 Other persistent atrial fibrillation: Secondary | ICD-10-CM | POA: Insufficient documentation

## 2019-09-01 DIAGNOSIS — M810 Age-related osteoporosis without current pathological fracture: Secondary | ICD-10-CM | POA: Insufficient documentation

## 2019-09-01 DIAGNOSIS — G709 Myoneural disorder, unspecified: Secondary | ICD-10-CM | POA: Insufficient documentation

## 2019-09-01 DIAGNOSIS — M1990 Unspecified osteoarthritis, unspecified site: Secondary | ICD-10-CM | POA: Insufficient documentation

## 2019-09-01 DIAGNOSIS — Z8249 Family history of ischemic heart disease and other diseases of the circulatory system: Secondary | ICD-10-CM | POA: Insufficient documentation

## 2019-09-01 DIAGNOSIS — E785 Hyperlipidemia, unspecified: Secondary | ICD-10-CM | POA: Insufficient documentation

## 2019-09-01 HISTORY — PX: CARDIOVERSION: SHX1299

## 2019-09-01 HISTORY — DX: Family history of other specified conditions: Z84.89

## 2019-09-01 SURGERY — CARDIOVERSION
Anesthesia: General

## 2019-09-01 MED ORDER — PROPOFOL 10 MG/ML IV BOLUS
INTRAVENOUS | Status: DC | PRN
  Administered 2019-09-01: 25 mg via INTRAVENOUS
  Administered 2019-09-01 (×2): 50 mg via INTRAVENOUS

## 2019-09-01 MED ORDER — SODIUM CHLORIDE 0.9 % IV SOLN
INTRAVENOUS | Status: AC | PRN
  Administered 2019-09-01: 500 mL via INTRAVENOUS

## 2019-09-01 MED ORDER — LIDOCAINE 2% (20 MG/ML) 5 ML SYRINGE
INTRAMUSCULAR | Status: DC | PRN
  Administered 2019-09-01: 60 mg via INTRAVENOUS

## 2019-09-01 NOTE — Discharge Instructions (Signed)
Electrical Cardioversion Electrical cardioversion is the delivery of a jolt of electricity to restore a normal rhythm to the heart. A rhythm that is too fast or is not regular keeps the heart from pumping well. In this procedure, sticky patches or metal paddles are placed on the chest to deliver electricity to the heart from a device. This procedure may be done in an emergency if:  There is low or no blood pressure as a result of the heart rhythm.  Normal rhythm must be restored as fast as possible to protect the brain and heart from further damage.  It may save a life. This may also be a scheduled procedure for irregular or fast heart rhythms that are not immediately life-threatening. Tell a health care provider about:  Any allergies you have.  All medicines you are taking, including vitamins, herbs, eye drops, creams, and over-the-counter medicines.  Any problems you or family members have had with anesthetic medicines.  Any blood disorders you have.  Any surgeries you have had.  Any medical conditions you have.  Whether you are pregnant or may be pregnant. What are the risks? Generally, this is a safe procedure. However, problems may occur, including:  Allergic reactions to medicines.  A blood clot that breaks free and travels to other parts of your body.  The possible return of an abnormal heart rhythm within hours or days after the procedure.  Your heart stopping (cardiac arrest). This is rare. What happens before the procedure? Medicines  Your health care provider may have you start taking: ? Blood-thinning medicines (anticoagulants) so your blood does not clot as easily. ? Medicines to help stabilize your heart rate and rhythm.  Ask your health care provider about: ? Changing or stopping your regular medicines. This is especially important if you are taking diabetes medicines or blood thinners. ? Taking medicines such as aspirin and ibuprofen. These medicines can  thin your blood. Do not take these medicines unless your health care provider tells you to take them. ? Taking over-the-counter medicines, vitamins, herbs, and supplements. General instructions  Follow instructions from your health care provider about eating or drinking restrictions.  Plan to have someone take you home from the hospital or clinic.  If you will be going home right after the procedure, plan to have someone with you for 24 hours.  Ask your health care provider what steps will be taken to help prevent infection. These may include washing your skin with a germ-killing soap. What happens during the procedure?   An IV will be inserted into one of your veins.  Sticky patches (electrodes) or metal paddles may be placed on your chest.  You will be given a medicine to help you relax (sedative).  An electrical shock will be delivered. The procedure may vary among health care providers and hospitals. What can I expect after the procedure?  Your blood pressure, heart rate, breathing rate, and blood oxygen level will be monitored until you leave the hospital or clinic.  Your heart rhythm will be watched to make sure it does not change.  You may have some redness on the skin where the shocks were given. Follow these instructions at home:  Do not drive for 24 hours if you were given a sedative during your procedure.  Take over-the-counter and prescription medicines only as told by your health care provider.  Ask your health care provider how to check your pulse. Check it often.  Rest for 48 hours after the procedure or   as told by your health care provider.  Avoid or limit your caffeine use as told by your health care provider.  Keep all follow-up visits as told by your health care provider. This is important. Contact a health care provider if:  You feel like your heart is beating too quickly or your pulse is not regular.  You have a serious muscle cramp that does not go  away. Get help right away if:  You have discomfort in your chest.  You are dizzy or you feel faint.  You have trouble breathing or you are short of breath.  Your speech is slurred.  You have trouble moving an arm or leg on one side of your body.  Your fingers or toes turn cold or blue. Summary  Electrical cardioversion is the delivery of a jolt of electricity to restore a normal rhythm to the heart.  This procedure may be done right away in an emergency or may be a scheduled procedure if the condition is not an emergency.  Generally, this is a safe procedure.  After the procedure, check your pulse often as told by your health care provider. This information is not intended to replace advice given to you by your health care provider. Make sure you discuss any questions you have with your health care provider. Document Revised: 01/24/2019 Document Reviewed: 01/24/2019 [employer]vier Patient Education  2020 [employer]  

## 2019-09-01 NOTE — Addendum Note (Signed)
Addendum  created 09/01/19 1607 by Leonides Grills, MD   Intraprocedure Staff edited

## 2019-09-01 NOTE — Interval H&P Note (Signed)
History and Physical Interval Note:  09/01/2019 11:00 AM  Joshua Lowe  has presented today for surgery, with the diagnosis of AFIB.  The various methods of treatment have been discussed with the patient and family. After consideration of risks, benefits and other options for treatment, the patient has consented to  Procedure(s): CARDIOVERSION (N/A) as a surgical intervention.  The patient's history has been reviewed, patient examined, no change in status, stable for surgery.  I have reviewed the patient's chart and labs.  Questions were answered to the patient's satisfaction.     Kristeen Miss

## 2019-09-01 NOTE — Anesthesia Postprocedure Evaluation (Signed)
Anesthesia Post Note  Patient: Joshua Lowe  Procedure(s) Performed: CARDIOVERSION (N/A )     Patient location during evaluation: Endoscopy Anesthesia Type: General Level of consciousness: awake and alert Pain management: pain level controlled Vital Signs Assessment: post-procedure vital signs reviewed and stable Respiratory status: spontaneous breathing, nonlabored ventilation, respiratory function stable and patient connected to nasal cannula oxygen Cardiovascular status: blood pressure returned to baseline and stable Postop Assessment: no apparent nausea or vomiting Anesthetic complications: no    Last Vitals:  Vitals:   09/01/19 1153 09/01/19 1200  BP:  (!) 160/60  Pulse: (!) 53 66  Resp: 20 15  Temp:    SpO2: 98% 100%    Last Pain:  Vitals:   09/01/19 1200  TempSrc:   PainSc: 0-No pain                 Trevor Iha

## 2019-09-01 NOTE — CV Procedure (Signed)
    Cardioversion Note  MILLEDGE GERDING [id number] 08-02-1955  Procedure: DC Cardioversion Indications:  Atrial fib   Procedure Details Consent: Obtained Time Out: Verified patient identification, verified procedure, site/side was marked, verified correct patient position, special equipment/implants available, Radiology Safety Procedures followed,  medications/allergies/relevent history reviewed, required imaging and test results available.  Performed  The patient has been on adequate anticoagulation.  The patient received IV  Lidocaine 60 mg IV followed by Propofol 125 mg IV  for sedation.  Synchronous cardioversion was performed at 200, 200, 200  joules.  The cardioversion was unsuccessful.     Complications: No apparent complications Patient did tolerate procedure well.   Vesta Mixer, Montez Hageman., MD, [facility] 09/01/2019, 11:36 AM

## 2019-09-01 NOTE — Transfer of Care (Signed)
Immediate Anesthesia Transfer of Care Note  Patient: LAFAYETTE DUNLEVY  Procedure(s) Performed: CARDIOVERSION (N/A )  Patient Location: Endoscopy Unit  Anesthesia Type:General  Level of Consciousness: drowsy and patient cooperative  Airway & Oxygen Therapy: Patient Spontanous Breathing  Post-op Assessment: Report given to RN, Post -op Vital signs reviewed and stable and Patient moving all extremities X 4  Post vital signs: Reviewed and stable  Last Vitals:  Vitals Value Taken Time  BP    Temp    Pulse    Resp    SpO2      Last Pain:  Vitals:   09/01/19 1123  PainSc: 0-No pain         Complications: No apparent anesthesia complications

## 2019-09-08 ENCOUNTER — Telehealth: Payer: Self-pay | Admitting: Pharmacist

## 2019-09-08 ENCOUNTER — Other Ambulatory Visit: Payer: Self-pay | Admitting: Nurse Practitioner

## 2019-09-08 ENCOUNTER — Other Ambulatory Visit: Payer: Self-pay

## 2019-09-08 ENCOUNTER — Ambulatory Visit (HOSPITAL_COMMUNITY)
Admission: RE | Admit: 2019-09-08 | Discharge: 2019-09-08 | Disposition: A | Discharge status: Home / Self Care | Payer: Self-pay | Source: Ambulatory Visit | Attending: Nurse Practitioner | Admitting: Nurse Practitioner

## 2019-09-08 ENCOUNTER — Ambulatory Visit (HOSPITAL_COMMUNITY): Payer: Self-pay | Admitting: Nurse Practitioner

## 2019-09-08 VITALS — BP 100/74 | Ht 72.0 in | Wt 217.8 lb

## 2019-09-08 DIAGNOSIS — Z79899 Other long term (current) drug therapy: Secondary | ICD-10-CM | POA: Insufficient documentation

## 2019-09-08 DIAGNOSIS — B181 Chronic viral hepatitis B without delta-agent: Secondary | ICD-10-CM

## 2019-09-08 DIAGNOSIS — G549 Nerve root and plexus disorder, unspecified: Secondary | ICD-10-CM | POA: Insufficient documentation

## 2019-09-08 DIAGNOSIS — D6869 Other thrombophilia: Secondary | ICD-10-CM

## 2019-09-08 DIAGNOSIS — I4819 Other persistent atrial fibrillation: Secondary | ICD-10-CM | POA: Insufficient documentation

## 2019-09-08 DIAGNOSIS — I351 Nonrheumatic aortic (valve) insufficiency: Secondary | ICD-10-CM | POA: Insufficient documentation

## 2019-09-08 DIAGNOSIS — M810 Age-related osteoporosis without current pathological fracture: Secondary | ICD-10-CM | POA: Insufficient documentation

## 2019-09-08 DIAGNOSIS — E0500 Thyrotoxicosis with diffuse goiter without thyrotoxic crisis or storm: Secondary | ICD-10-CM | POA: Insufficient documentation

## 2019-09-08 DIAGNOSIS — Z7901 Long term (current) use of anticoagulants: Secondary | ICD-10-CM | POA: Insufficient documentation

## 2019-09-08 DIAGNOSIS — G8929 Other chronic pain: Secondary | ICD-10-CM | POA: Insufficient documentation

## 2019-09-08 DIAGNOSIS — F419 Anxiety disorder, unspecified: Secondary | ICD-10-CM | POA: Insufficient documentation

## 2019-09-08 DIAGNOSIS — Z79890 Hormone replacement therapy (postmenopausal): Secondary | ICD-10-CM | POA: Insufficient documentation

## 2019-09-08 NOTE — Telephone Encounter (Signed)
Medication list reviewed in anticipation of upcoming Tikosyn initiation. Patient is taking QTc prolonging medications. He is on low doses of trazodone and benadryl and should ideally stop these medications while on Tikosyn.  Patient is anticoagulated on Xarelto on the appropriate dose. Please ensure that patient has not missed any anticoagulation doses in the 3 weeks prior to Tikosyn initiation.

## 2019-09-09 ENCOUNTER — Encounter (HOSPITAL_COMMUNITY): Payer: Self-pay | Admitting: Nurse Practitioner

## 2019-09-09 NOTE — Progress Notes (Signed)
Primary Care Physician: Georgann Housekeeper, MD Referring Physician: Dr. Johney Frame  Cardiologist: Dr. Lilyan Gilford is a 64 y.o. male with a h/o Graves's disease, s/p RAI ablation, persistent afib since spring of 2020.  He recently saw Dr. Johney Frame virtually and he suggested starting  xarelto 20 mg daily and planning on cardioversion. CHA2DS2VASc score of 0. He has been on xarelto 20 mg daily since 07/14/19.  He is self employed, no drug insurance.  He does not drink alcohol, no tobacco, some caffeine, pending a sleep study with Dr. Earl Gala. Pt does c/o of significant  fatigue since he has been in afib.   Dr. Jenel Lucks recommendation was to start flecainide + BB at this visit after 3 weeks on anticoagulation followed by cardioversion (if drug did not convert pt)\  F/u in afib clinic, 09/08/19. Pt had unsuccessful  cardioversion, he did not convert. He is now here to discuss options. The pt and wife really want to try to  get him back in rhythm despite not having insurance. They are willing to pay out of pocket for a tikosyn admit. He is not an ablation candidate at this point and this would be an extremely expensive procedure   Today, he denies symptoms of palpitations, chest pain, shortness of breath, orthopnea, PND, lower extremity edema, dizziness, presyncope, syncope, or neurologic sequela. The patient is tolerating medications without difficulties and is otherwise without complaint today.   Past Medical History:  Diagnosis Date  . AC (acromioclavicular) joint bone spurs   . Allergy   . 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 Anxiety   . Arthritis   . Body mass index 31.0-31.9, adult   . Cervical disc disease   . Chronic back pain   . Chronic neck pain   . Depression   . Eosinophilic disorder   . Family history of adverse reaction to anesthesia    mother had nausea  . Graves' disease   . H/O colonoscopy 12/2008  . Hyperlipidemia   . Hyperthyroidism    s/p RAI ablation  . Insomnia     . Lumbar back pain   . Neuromuscular disorder (HCC)   . Osteoporosis   . Persistent atrial fibrillation (HCC) 04/12/2019  . Pruritus, unspecified   . Thyroid eye disease   . 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 BACK SURGERY     neck surgery  . CARDIOVERSION N/A 09/01/2019   Procedure: CARDIOVERSION;  Surgeon: [name] Deloris Ping, MD;  Location: [facility] ENDOSCOPY;  Service: Cardiovascular;  Laterality: N/A;  . FISSURECTOMY    . HAND SURGERY    . KNEE ARTHROSCOPY  1999/2007   right knee  . LIPOMA EXCISION     back  . NECK SURGERY    . SHOULDER SURGERY      Current Outpatient Medications  Medication Sig Dispense Refill  . diphenhydrAMINE (BENADRYL) 25 mg capsule Take 25 mg by mouth every 6 (six) hours as needed for allergies.     Marland Kitchen HYDROcodone-acetaminophen (NORCO/VICODIN) 5-325 MG tablet Take 1 tablet by mouth 2 (two) times daily as needed (pain.).     Marland Kitchen ibuprofen (ADVIL) 800 MG tablet Take 800 mg by mouth as needed.     Marland Kitchen levothyroxine (SYNTHROID) 175 MCG tablet Take 175 mcg by mouth daily before breakfast.    . metoprolol succinate (TOPROL XL) 25 MG 24 hr tablet Take 0.5 tablets (12.5 mg total) by mouth  at bedtime. 30 tablet 3  . rivaroxaban (XARELTO) 20 MG TABS tablet Take 1 tablet (20 mg total) by mouth daily with supper. 90 tablet 3  . traZODone (DESYREL) 150 MG tablet Take 50 mg by mouth at bedtime.     . VEMLIDY 25 MG TABS Take 25 mg by mouth daily.     Marland Kitchen zolpidem (AMBIEN) 10 MG tablet Take 10 mg by mouth at bedtime as needed for sleep.      No current facility-administered medications for this encounter.    Allergies  Allergen Reactions  . Bee Venom Swelling    Social History   Socioeconomic History  . Marital status: Married    Spouse name: Not on file  . Number of children: Not on file  . Years of education: Not on file  . Highest education level: Not on file  Occupational History  . Not on file   Tobacco Use  . Smoking status: Never Smoker  . Smokeless tobacco: Never Used  Substance and Sexual Activity  . Alcohol use: Not Currently    Alcohol/week: 1.0 - 2.0 standard drinks    Types: 1 - 2 Cans of beer per week  . Drug use: No  . Sexual activity: Not on file  Other Topics Concern  . Not on file  Social History Narrative   Lives in [place] with spouse.   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 Difficulty of Paying Living Expenses: Not on file  Food Insecurity:   . Worried About Programme researcher, broadcasting/film/video in the Last Year: Not on file  . Ran Out of Food in the Last Year: Not on file  Transportation Needs:   . Lack of Transportation (Medical): Not on file  . Lack of Transportation (Non-Medical): Not on file  Physical Activity:   . Days of Exercise per Week: Not on file  . Minutes of Exercise per Session: Not on file  Stress:   . Feeling of Stress : Not on file  Social Connections:   . Frequency of Communication with Friends and Family: Not on file  . Frequency of Social Gatherings with Friends and Family: Not on file  . Attends Religious Services: Not on file  . Active Member of Clubs or Organizations: Not on file  . Attends Banker Meetings: Not on file  . Marital Status: Not on file  Intimate Partner Violence:   . Fear of Current or Ex-Partner: Not on file  . Emotionally Abused: Not on file  . Physically Abused: Not on file  . Sexually Abused: Not on file    Family History  Problem Relation Age of Onset  . Cancer Mother        skin  . Atrial fibrillation Mother     ROS- All systems are reviewed and negative except as per the HPI above  Physical Exam: Vitals:   09/08/19 1334  BP: 100/74  Weight: 98.8 kg  Height: 6' (1.829 m)   Wt Readings from Last 3 Encounters:  09/08/19 98.8 kg  09/01/19 93 kg  08/18/19 96.2 kg    Labs: Lab Results  Component Value Date   NA 136  08/24/2019   K 4.5 08/24/2019   CL 101 08/24/2019   CO2 28 08/24/2019   GLUCOSE 120 (H) 08/24/2019   BUN 12 08/24/2019   CREATININE 0.92 08/24/2019   CALCIUM 9.4 08/24/2019   No results found for: INR  No results found for: CHOL, HDL, LDLCALC, TRIG   GEN- The patient is well appearing, alert and oriented x 3 today.   Head- normocephalic, atraumatic Eyes-  Sclera clear, conjunctiva pink Ears- hearing intact Oropharynx- clear Neck- supple, no JVP Lymph- no cervical lymphadenopathy Lungs- Clear to ausculation bilaterally, normal work of breathing Heart- Regular rate and rhythm, no murmurs, rubs or gallops, PMI not laterally displaced GI- soft, NT, ND, + BS Extremities- no clubbing, cyanosis, or edema MS- no significant deformity or atrophy Skin- no rash or lesion Psych- euthymic mood, full affect Neuro- strength and sensation are intact  EKG-not done today  Echo-IMPRESSIONS    1. Left ventricular ejection fraction, by visual estimation, is 60 to  65%. The left ventricle has normal function. Normal left ventricular size.  There is no left ventricular hypertrophy.  2. Left ventricular diastolic function could not be evaluated pattern of  LV diastolic filling.  3. Global right ventricle has normal systolic function.The right  ventricular size is normal. No increase in right ventricular wall  thickness.  4. Left atrial size was normal.  5. Right atrial size was normal.  6. The mitral valve is normal in structure. Trace mitral valve  regurgitation. No evidence of mitral stenosis.  7. The tricuspid valve is normal in structure. Tricuspid valve  regurgitation is trivial.  8. The aortic valve is tricuspid Aortic valve regurgitation is moderate  by color flow Doppler. Structurally normal aortic valve, with no evidence  of sclerosis or stenosis.  9. The pulmonic valve was grossly normal. Pulmonic valve regurgitation is  trivial by color flow Doppler.  10. Mildly  elevated pulmonary artery systolic pressure.  11. The inferior vena cava is dilated in size with <50% respiratory  variability, suggesting right atrial pressure of 15 mmHg.   FINDINGS  Left Ventricle: Left ventricular ejection fraction, by visual estimation,  is 60 to 65%. The left ventricle has normal function. No evidence of left  ventricular regional wall motion abnormalities. There is no left  ventricular hypertrophy. Normal left  ventricular size. Spectral Doppler shows Left ventricular diastolic  function could not be evaluated pattern of LV diastolic filling.     Assessment and Plan: 1. Persistent symptomatic  afib since March 2020 Failed flecainide load and cardioversion Will stop flecainide The pt/wife really want to pursue him getting back in rhythm as the fatigue has worsened and is affecting his quality of life I think his only option at this point to pursue Tikosyn admit He is not interested in amiodarone for the potential side effects of drug  The wife was given the codes to call business office and discuss cost for this type of hospitalization He could get the drug thru good rx  His qtc is in good range for tikosyn Drugs screened and trazodone may be an issue with tikosyn  Crcl cal is 117, qualifying for the 500 mcg dose bid of tikosyn  2. CHA2DS2VASc score of 0 He is currently taking xarleto 20 mg daily, hopefully will not require anticoagulatiion long term   Wife will further discuss with the businesses office and then plans will be put into place. I stressed to pt and wife that I could not guarantee that tikosyn would work, will just have to try and see, they voiced understanding    Butch Penny C. Smith Mcnicholas, [facility] [address] [place], [place] [address] [phone]

## 2019-09-12 ENCOUNTER — Other Ambulatory Visit (HOSPITAL_COMMUNITY): Payer: Self-pay | Admitting: *Deleted

## 2019-09-12 NOTE — Telephone Encounter (Signed)
Patient wife notified to stop benadryl and will talk to PCP regarding trazodone needing to be discontinued.  She will inform if pt is started on anything new to replace trazodone.

## 2019-09-15 ENCOUNTER — Other Ambulatory Visit: Payer: Self-pay

## 2019-09-23 ENCOUNTER — Telehealth (HOSPITAL_COMMUNITY): Payer: Self-pay | Admitting: *Deleted

## 2019-09-23 MED ORDER — APIXABAN 5 MG PO TABS: 5.0000 mg | ORAL_TABLET | Freq: Two times a day (BID) | ORAL | 3 refills | Status: DC

## 2019-09-23 NOTE — Telephone Encounter (Signed)
Patient wife called in stating rash is worsening on xarelto and would like to be switched to different DOAC. Per Rudi Coco NP stop xarelto and start eliquis 5mg  twice a day starting today with no interruption. Wife verbalized understanding.

## 2019-10-03 ENCOUNTER — Telehealth (HOSPITAL_COMMUNITY): Payer: Self-pay | Admitting: *Deleted

## 2019-10-03 NOTE — Telephone Encounter (Signed)
preauthorization for pending admission on 4/6 case #[id number]

## 2019-10-06 ENCOUNTER — Ambulatory Visit
Admission: RE | Admit: 2019-10-06 | Discharge: 2019-10-06 | Disposition: A | Discharge status: Home / Self Care | Payer: 59 | Source: Ambulatory Visit | Attending: Nurse Practitioner | Admitting: Nurse Practitioner

## 2019-10-06 DIAGNOSIS — B181 Chronic viral hepatitis B without delta-agent: Secondary | ICD-10-CM

## 2019-10-06 NOTE — Telephone Encounter (Signed)
Patient wife called in stating rash worsened with eliquis therefore he switched back to xarelto without interruption. Following up with dermatologist regarding continued itchy rash as it continues to spread. Diagnosed with scapies and receiving treatment for this for the next 10 days. Pts scheduled admission for tikosyn has been postponed for 14 days. There are no interactions between tikosyn and the medications to treat.

## 2019-10-07 ENCOUNTER — Other Ambulatory Visit (HOSPITAL_COMMUNITY): Payer: Self-pay

## 2019-10-11 ENCOUNTER — Ambulatory Visit (HOSPITAL_COMMUNITY): Payer: Self-pay | Admitting: Nurse Practitioner

## 2019-10-21 ENCOUNTER — Other Ambulatory Visit (HOSPITAL_COMMUNITY)
Admission: RE | Admit: 2019-10-21 | Discharge: 2019-10-21 | Disposition: A | Discharge status: Home / Self Care | Payer: 59 | Source: Ambulatory Visit | Attending: Nurse Practitioner | Admitting: Nurse Practitioner

## 2019-10-21 DIAGNOSIS — Z20822 Contact with and (suspected) exposure to covid-19: Secondary | ICD-10-CM | POA: Diagnosis not present

## 2019-10-21 DIAGNOSIS — Z01812 Encounter for preprocedural laboratory examination: Secondary | ICD-10-CM | POA: Insufficient documentation

## 2019-10-21 LAB — SARS CORONAVIRUS 2 (TAT 6-24 HRS): SARS Coronavirus 2: NEGATIVE

## 2019-10-25 ENCOUNTER — Ambulatory Visit (HOSPITAL_COMMUNITY)
Admission: RE | Admit: 2019-10-25 | Discharge: 2019-10-25 | Disposition: A | Discharge status: Home / Self Care | Payer: 59 | Source: Ambulatory Visit | Attending: Nurse Practitioner | Admitting: Nurse Practitioner

## 2019-10-25 ENCOUNTER — Encounter (HOSPITAL_COMMUNITY): Payer: Self-pay | Admitting: Internal Medicine

## 2019-10-25 ENCOUNTER — Encounter (HOSPITAL_COMMUNITY): Payer: Self-pay | Admitting: Nurse Practitioner

## 2019-10-25 ENCOUNTER — Other Ambulatory Visit: Payer: Self-pay

## 2019-10-25 ENCOUNTER — Inpatient Hospital Stay (HOSPITAL_COMMUNITY)
Admission: AD | Admit: 2019-10-25 | Discharge: 2019-10-28 | DRG: 310 | Disposition: A | Discharge status: Home / Self Care | Payer: 59 | Attending: Internal Medicine | Admitting: Internal Medicine

## 2019-10-25 VITALS — BP 120/66 | HR 63 | Ht 72.0 in | Wt 207.6 lb

## 2019-10-25 DIAGNOSIS — G4730 Sleep apnea, unspecified: Secondary | ICD-10-CM | POA: Diagnosis present

## 2019-10-25 DIAGNOSIS — M810 Age-related osteoporosis without current pathological fracture: Secondary | ICD-10-CM | POA: Diagnosis present

## 2019-10-25 DIAGNOSIS — G4700 Insomnia, unspecified: Secondary | ICD-10-CM | POA: Diagnosis present

## 2019-10-25 DIAGNOSIS — E785 Hyperlipidemia, unspecified: Secondary | ICD-10-CM | POA: Diagnosis present

## 2019-10-25 DIAGNOSIS — Z7901 Long term (current) use of anticoagulants: Secondary | ICD-10-CM

## 2019-10-25 DIAGNOSIS — Z79890 Hormone replacement therapy (postmenopausal): Secondary | ICD-10-CM | POA: Diagnosis not present

## 2019-10-25 DIAGNOSIS — M779 Enthesopathy, unspecified: Secondary | ICD-10-CM | POA: Diagnosis present

## 2019-10-25 DIAGNOSIS — I4819 Other persistent atrial fibrillation: Principal | ICD-10-CM

## 2019-10-25 DIAGNOSIS — M5030 Other cervical disc degeneration, unspecified cervical region: Secondary | ICD-10-CM | POA: Diagnosis present

## 2019-10-25 DIAGNOSIS — E0500 Thyrotoxicosis with diffuse goiter without thyrotoxic crisis or storm: Secondary | ICD-10-CM | POA: Diagnosis present

## 2019-10-25 DIAGNOSIS — Z91030 Bee allergy status: Secondary | ICD-10-CM | POA: Diagnosis not present

## 2019-10-25 DIAGNOSIS — D6869 Other thrombophilia: Secondary | ICD-10-CM

## 2019-10-25 DIAGNOSIS — Z79891 Long term (current) use of opiate analgesic: Secondary | ICD-10-CM

## 2019-10-25 DIAGNOSIS — E039 Hypothyroidism, unspecified: Secondary | ICD-10-CM | POA: Diagnosis present

## 2019-10-25 DIAGNOSIS — I4891 Unspecified atrial fibrillation: Secondary | ICD-10-CM | POA: Diagnosis present

## 2019-10-25 HISTORY — DX: Dyspnea, unspecified: R06.00

## 2019-10-25 HISTORY — DX: Hypothyroidism, unspecified: E03.9

## 2019-10-25 LAB — BASIC METABOLIC PANEL
Anion gap: 7 (ref 5–15)
BUN: 12 mg/dL (ref 8–23)
CO2: 26 mmol/L (ref 22–32)
Calcium: 9.4 mg/dL (ref 8.9–10.3)
Chloride: 107 mmol/L (ref 98–111)
Creatinine, Ser: 0.75 mg/dL (ref 0.61–1.24)
GFR calc Af Amer: >60 mL/min (ref >60)
GFR calc non Af Amer: >60 mL/min (ref >60)
Glucose, Bld: 82 mg/dL (ref 70–99)
Potassium: 4.1 mmol/L (ref 3.5–5.1)
Sodium: 140 mmol/L (ref 135–145)

## 2019-10-25 LAB — MAGNESIUM: Magnesium: 2.2 mg/dL (ref 1.7–2.4)

## 2019-10-25 MED ORDER — METOPROLOL SUCCINATE ER 25 MG PO TB24
12.5000 mg | ORAL_TABLET | Freq: Every day | ORAL | Status: DC
  Administered 2019-10-25 – 2019-10-27 (×3): 12.5 mg via ORAL
  Filled 2019-10-25 (×3): qty 1

## 2019-10-25 MED ORDER — LEVOTHYROXINE SODIUM 175 MCG PO TABS
175.0000 ug | ORAL_TABLET | Freq: Every day | ORAL | Status: DC
  Administered 2019-10-26 – 2019-10-28 (×3): 175 ug via ORAL
  Filled 2019-10-25 (×3): qty 1

## 2019-10-25 MED ORDER — SODIUM CHLORIDE 0.9 % IV SOLN: 250.0000 mL | INTRAVENOUS | Status: DC | PRN

## 2019-10-25 MED ORDER — DOFETILIDE 500 MCG PO CAPS
500.0000 ug | ORAL_CAPSULE | Freq: Two times a day (BID) | ORAL | Status: DC
  Administered 2019-10-25 – 2019-10-28 (×6): 500 ug via ORAL
  Filled 2019-10-25 (×6): qty 1

## 2019-10-25 MED ORDER — SODIUM CHLORIDE 0.9% FLUSH
3.0000 mL | Freq: Two times a day (BID) | INTRAVENOUS | Status: DC
  Administered 2019-10-26: 3 mL via INTRAVENOUS

## 2019-10-25 MED ORDER — RIVAROXABAN 20 MG PO TABS
20.0000 mg | ORAL_TABLET | Freq: Every day | ORAL | Status: DC
  Administered 2019-10-25 – 2019-10-27 (×3): 20 mg via ORAL
  Filled 2019-10-25 (×3): qty 1

## 2019-10-25 MED ORDER — SODIUM CHLORIDE 0.9% FLUSH: 3.0000 mL | INTRAVENOUS | Status: DC | PRN

## 2019-10-25 MED ORDER — ZOLPIDEM TARTRATE 5 MG PO TABS
5.0000 mg | ORAL_TABLET | Freq: Every evening | ORAL | Status: DC | PRN
  Administered 2019-10-26: 5 mg via ORAL
  Filled 2019-10-25: qty 1

## 2019-10-25 MED ORDER — TENOFOVIR ALAFENAMIDE FUMARATE 25 MG PO TABS
25.0000 mg | ORAL_TABLET | Freq: Every day | ORAL | Status: DC
  Administered 2019-10-26 – 2019-10-28 (×3): 25 mg via ORAL
  Filled 2019-10-25 (×3): qty 1

## 2019-10-25 NOTE — Progress Notes (Signed)
Pharmacy: Dofetilide (Tikosyn) - Initial Consult Assessment and Electrolyte Replacement  Pharmacy consulted to assist in monitoring and replacing electrolytes in this 64 y.o. male admitted on 10/25/2019 undergoing dofetilide initiation. First dofetilide dose: 500 mcg on 4/20 at 20:00  Assessment:  Patient Exclusion Criteria: If any screening criteria checked as "Yes", then  patient  should NOT receive dofetilide until criteria item is corrected.  If "Yes" please indicate correction plan.  YES  NO Patient  Exclusion Criteria Correction Plan   []   [x]   Baseline QTc interval is greater than or equal to 440 msec. IF above YES box checked dofetilide contraindicated unless patient has ICD; then may proceed if QTc 500-550 msec or with known ventricular conduction abnormalities may proceed with QTc 550-600 msec. QTc =      []   [x]   Patient is known or suspected to have a digoxin level greater than 2 ng/ml: No results found for: DIGOXIN     []   [x]   Creatinine clearance less than 20 ml/min (calculated using Cockcroft-Gault, actual body weight and serum creatinine): Estimated Creatinine Clearance: 115.2 mL/min (by C-G formula based on SCr of 0.75 mg/dL).     []   [x]  Patient has received drugs known to prolong the QT intervals within the last 48 hours (phenothiazines, tricyclics or tetracyclic antidepressants, erythromycin, H-1 antihistamines, cisapride, fluoroquinolones, azithromycin). Drugs not listed above may have an, as yet, undetected potential to prolong the QT interval, updated information on QT prolonging agents is available at this website:QT prolonging agents or www.crediblemeds.org    []   [x]   Patient received a dose of hydrochlorothiazide (Oretic) alone or in any combination including triamterene (Dyazide, Maxzide) in the last 48 hours.    []   [x]  Patient received a medication known to increase dofetilide plasma concentrations prior to initial dofetilide dose:  . Trimethoprim  (Primsol, Proloprim) in the last 36 hours . Verapamil (Calan, Verelan) in the last 36 hours or a sustained release dose in the last 72 hours . Megestrol (Megace) in the last 5 days  . Cimetidine (Tagamet) in the last 6 hours . Ketoconazole (Nizoral) in the last 24 hours . Itraconazole (Sporanox) in the last 48 hours  . Prochlorperazine (Compazine) in the last 36 hours     []   [x]   Patient is known to have a history of torsades de pointes; congenital or acquired long QT syndromes.    []   [x]   Patient has received a Class 1 antiarrhythmic with less than 2 half-lives since last dose. (Disopyramide, Quinidine, Procainamide, Lidocaine, Mexiletine, Flecainide, Propafenone)    []   [x]   Patient has received amiodarone therapy in the past 3 months or amiodarone level is greater than 0.3 ng/ml.    Patient has been appropriately anticoagulated with Xarelto.  Labs:    Component Value Date/Time   K 4.1 10/25/2019 1125   MG 2.2 10/25/2019 1125     Plan: Potassium: K >/= 4: Appropriate to initiate Tikosyn, no replacement needed    Magnesium: Mg >2: Appropriate to initiate Tikosyn, no replacement needed     Thank you for involving pharmacy in this patient's care.  , PharmD, BCPS Clinical Pharmacist Clinical phone for 10/25/2019 until 10p is [phone] 10/25/2019 4:29 PM  **Pharmacist phone directory can be found on [email or web address] listed under [facility]**

## 2019-10-25 NOTE — Progress Notes (Signed)
Primary Care Physician: Wenda Low, MD Referring Physician: Dr. Rayann Heman  Cardiologist: Dr. Epifania Gore is a 64 y.o. male with a h/o Graves's disease, s/p RAI ablation, persistent afib since spring of 2020.  He recently saw Dr. Rayann Heman virtually and he suggested starting  xarelto 20 mg daily and planning on cardioversion. CHA2DS2VASc score of 0. He has been on xarelto 20 mg daily since 07/14/19.  He is self employed, no drug insurance.  He does not drink alcohol, no tobacco, some caffeine, pending a sleep study with Dr. Maxwell Caul. Pt does c/o of significant  fatigue since he has been in afib.   Dr. Jackalyn Lombard recommendation was to start flecainide + BB at this visit after 3 weeks on anticoagulation followed by cardioversion (if drug did not convert pt)\  F/u in afib clinic, 09/08/19. Pt had unsuccessful  cardioversion, he did not convert. He is now here to discuss options. The pt and wife really want to try to  get him back in rhythm despite not having insurance. They are willing to pay out of pocket for a tikosyn admit. He is not an ablation candidate at this point and this would be an extremely expensive procedure.  F/u in afib clinic for tikosyn admit, 4/20. He now has insurance and can afford drug thru [facility], if not his new insurance. No benadryl use, no missed anticoagulation. He did stop trazodone many days ago.Off flecainide weeks ago.  Today, he denies symptoms of palpitations, chest pain, shortness of breath, orthopnea, PND, lower extremity edema, dizziness, presyncope, syncope, or neurologic sequela. The patient is tolerating medications without difficulties and is otherwise without complaint today.   Past Medical History:  Diagnosis Date  . AC (acromioclavicular) joint bone spurs   . Allergy   . 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 Anxiety   . Arthritis   . Body mass index 31.0-31.9, adult   . Cervical disc disease   . Chronic back pain   . Chronic neck pain   .  Depression   . Eosinophilic disorder   . Family history of adverse reaction to anesthesia    mother had nausea  . Graves' disease   . H/O colonoscopy 12/2008  . Hyperlipidemia   . Hyperthyroidism    s/p RAI ablation  . Insomnia   . Lumbar back pain   . Neuromuscular disorder ([place])   . Osteoporosis   . Persistent atrial fibrillation ([place]) 04/12/2019  . Pruritus, unspecified   . Thyroid eye disease   . 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 BACK SURGERY     neck surgery  . CARDIOVERSION N/A 09/01/2019   Procedure: CARDIOVERSION;  Surgeon: Acie Fredrickson Wonda Cheng, MD;  Location: [facility] ENDOSCOPY;  Service: Cardiovascular;  Laterality: N/A;  . FISSURECTOMY    . HAND SURGERY    . KNEE ARTHROSCOPY  1999/2007   right knee  . LIPOMA EXCISION     back  . NECK SURGERY    . SHOULDER SURGERY      Current Outpatient Medications  Medication Sig Dispense Refill  . HYDROcodone-acetaminophen (NORCO/VICODIN) 5-325 MG tablet Take 1 tablet by mouth as needed (pain.).     Marland Kitchen hydrOXYzine (ATARAX/VISTARIL) 25 MG tablet Take 25 mg by mouth as needed.     Marland Kitchen levothyroxine (SYNTHROID) 175 MCG tablet Take 175 mcg by mouth daily before breakfast.    . metoprolol succinate (TOPROL XL) 25  MG 24 hr tablet Take 0.5 tablets (12.5 mg total) by mouth at bedtime. 30 tablet 3  . rivaroxaban (XARELTO) 20 MG TABS tablet Take 20 mg by mouth daily with supper.    . triamcinolone cream (KENALOG) 0.1 % APPLY CREAM EXTERNALLY TWICE DAILY    . VEMLIDY 25 MG TABS Take 25 mg by mouth daily.     Marland Kitchen zolpidem (AMBIEN) 10 MG tablet Take 10 mg by mouth at bedtime as needed for sleep.      No current facility-administered medications for this encounter.    Allergies  Allergen Reactions  . Bee Venom Swelling    Social History   Socioeconomic History  . Marital status: Married    Spouse name: Not on file  . Number of children: Not on file  . Years of education:  Not on file  . Highest education level: Not on file  Occupational History  . Not on file  Tobacco Use  . Smoking status: Never Smoker  . Smokeless tobacco: Never Used  Substance and Sexual Activity  . Alcohol use: Not Currently    Alcohol/week: 1.0 - 2.0 standard drinks    Types: 1 - 2 Cans of beer per week  . Drug use: No  . Sexual activity: Not on file  Other Topics Concern  . Not on file  Social History Narrative   Lives in [place] with spouse.   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 Difficulty of Paying Living Expenses:   Food Insecurity:   . Worried About Programme researcher, broadcasting/film/video in the Last Year:   . Barista in the Last Year:   Transportation Needs:   . Freight forwarder (Medical):   Marland Kitchen Lack of Transportation (Non-Medical):   Physical Activity:   . Days of Exercise per Week:   . Minutes of Exercise per Session:   Stress:   . Feeling of Stress :   Social Connections:   . Frequency of Communication with Friends and Family:   . Frequency of Social Gatherings with Friends and Family:   . Attends Religious Services:   . Active Member of Clubs or Organizations:   . Attends Banker Meetings:   Marland Kitchen Marital Status:   Intimate Partner Violence:   . Fear of Current or Ex-Partner:   . Emotionally Abused:   Marland Kitchen Physically Abused:   . Sexually Abused:     Family History  Problem Relation Age of Onset  . Cancer Mother        skin  . Atrial fibrillation Mother     ROS- All systems are reviewed and negative except as per the HPI above  Physical Exam: Vitals:   10/25/19 1120  BP: 120/66  Pulse: 63  Weight: 94.2 kg  Height: 6' (1.829 m)   Wt Readings from Last 3 Encounters:  10/25/19 94.2 kg  09/08/19 98.8 kg  09/01/19 93 kg    Labs: Lab Results  Component Value Date   NA 136 08/24/2019   K 4.5 08/24/2019   CL 101 08/24/2019   CO2 28 08/24/2019   GLUCOSE 120 (H)  08/24/2019   BUN 12 08/24/2019   CREATININE 0.92 08/24/2019   CALCIUM 9.4 08/24/2019   No results found for: INR No results found for: CHOL, HDL, LDLCALC, TRIG   GEN- The patient is well appearing, alert and oriented x 3 today.   Head- normocephalic, atraumatic Eyes-  Sclera clear, conjunctiva pink Ears- hearing intact Oropharynx- clear Neck- supple, no JVP Lymph- no cervical lymphadenopathy Lungs- Clear to ausculation bilaterally, normal work of breathing Heart-irregular rate and rhythm, no murmurs, rubs or gallops, PMI not laterally displaced GI- soft, NT, ND, + BS Extremities- no clubbing, cyanosis, or edema MS- no significant deformity or atrophy Skin- no rash or lesion Psych- euthymic mood, full affect Neuro- strength and sensation are intact  EKG- afib at 63 bpm, qrs int 86 ms, qtc 419 ms Echo-IMPRESSIONS    1. Left ventricular ejection fraction, by visual estimation, is 60 to  65%. The left ventricle has normal function. Normal left ventricular size.  There is no left ventricular hypertrophy.  2. Left ventricular diastolic function could not be evaluated pattern of  LV diastolic filling.  3. Global right ventricle has normal systolic function.The right  ventricular size is normal. No increase in right ventricular wall  thickness.  4. Left atrial size was normal.  5. Right atrial size was normal.  6. The mitral valve is normal in structure. Trace mitral valve  regurgitation. No evidence of mitral stenosis.  7. The tricuspid valve is normal in structure. Tricuspid valve  regurgitation is trivial.  8. The aortic valve is tricuspid Aortic valve regurgitation is moderate  by color flow Doppler. Structurally normal aortic valve, with no evidence  of sclerosis or stenosis.  9. The pulmonic valve was grossly normal. Pulmonic valve regurgitation is  trivial by color flow Doppler.  10. Mildly elevated pulmonary artery systolic pressure.  11. The inferior vena  cava is dilated in size with <50% respiratory  variability, suggesting right atrial pressure of 15 mmHg.   FINDINGS  Left Ventricle: Left ventricular ejection fraction, by visual estimation,  is 60 to 65%. The left ventricle has normal function. No evidence of left  ventricular regional wall motion abnormalities. There is no left  ventricular hypertrophy. Normal left  ventricular size. Spectral Doppler shows Left ventricular diastolic  function could not be evaluated pattern of LV diastolic filling. Normal left atrial size     Assessment and Plan: 1. Persistent symptomatic  afib since March 2020 Failed flecainide load and cardioversion 09/01/19 Off flecainide for  several weeks  The pt/wife really want to pursue him getting back in rhythm as the fatigue has worsened and is affecting his quality of life I think his only option at this point to pursue Tikosyn admit, on young side for amiodarone  No benadryl use He could get the drug thru [facility] or his new insurance group but unaware of the price of the drug thru insurance as of yet, can afford [facility] His qtc is in good range for tikosyn Drugs screened and trazodone /benadryl have been stopped  many weeks ago  Crcl cal is 137.36 indicating will start on 500 mcg bid of tikosyn Bmet/mag with good K+/mag levels  covid screened negative   2. CHA2DS2VASc score of 0 He is currently taking xarleto 20 mg daily, hopefully will not require anticoagulatiion long term for cost issues, no missed doses for at least 3 weeeks  Bed is not  ready for admission , will report home and come back when admission notifies     Lupita Leash C. [facility] [address] [place], [place] [address] [phone]

## 2019-10-25 NOTE — H&P (Signed)
Primary Care Physician: Georgann Housekeeper, MD Referring Physician: Dr. Johney Frame  Cardiologist: Dr. Lilyan Gilford is a 64 y.o. male with a h/o Graves's disease, s/p RAI ablation, persistent afib since spring of 2020.  He recently saw Dr. Johney Frame virtually and he suggested starting  xarelto 20 mg daily and planning on cardioversion. CHA2DS2VASc score of 0. He has been on xarelto 20 mg daily since 07/14/19.  He is self employed, no drug insurance.  He does not drink alcohol, no tobacco, some caffeine, pending a sleep study with Dr. Earl Gala. Pt does c/o of significant  fatigue since he has been in afib.   Dr. Jenel Lucks recommendation was to start flecainide + BB at this visit after 3 weeks on anticoagulation followed by cardioversion (if drug did not convert pt)\  F/u in afib clinic, 09/08/19. Pt had unsuccessful  cardioversion, he did not convert. He is now here to discuss options. The pt and wife really want to try to  get him back in rhythm despite not having insurance. They are willing to pay out of pocket for a tikosyn admit. He is not an ablation candidate at this point and this would be an extremely expensive procedure.  F/u in afib clinic for tikosyn admit, 4/20. He now has insurance and can afford drug thru [facility], if not his new insurance. No benadryl use, no missed anticoagulation. He did stop trazodone many days ago.Off flecainide weeks ago.  Today, he denies symptoms of palpitations, chest pain, shortness of breath, orthopnea, PND, lower extremity edema, dizziness, presyncope, syncope, or neurologic sequela. The patient is tolerating medications without difficulties and is otherwise without complaint today.   Past Medical History:  Diagnosis Date  . AC (acromioclavicular) joint bone spurs   . Allergy   . 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 Arthritis   . Body mass index 31.0-31.9, adult   . Cervical disc disease   . Chronic back pain   . Chronic neck pain   . Dyspnea   .  Eosinophilic disorder   . Family history of adverse reaction to anesthesia    mother had nausea  . Graves' disease   . H/O colonoscopy 12/2008  . Hyperlipidemia   . Hyperthyroidism    s/p RAI ablation  . Hypothyroidism   . Insomnia   . Lumbar back pain   . Osteoporosis   . Persistent atrial fibrillation ([facility]) 04/12/2019  . Pruritus, unspecified   . 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 CARDIOVERSION N/A 09/01/2019   Procedure: CARDIOVERSION;  Surgeon: [name] Deloris Ping, MD;  Location: [facility] ENDOSCOPY;  Service: Cardiovascular;  Laterality: N/A;  . FISSURECTOMY    . HAND SURGERY    . KNEE ARTHROSCOPY  1999/2007   right knee  . LIPOMA EXCISION     back  . NECK SURGERY    . SHOULDER SURGERY      Current Facility-Administered Medications  Medication Dose Route Frequency Provider Last Rate Last Admin  . 0.9 %  sodium chloride infusion  250 mL Intravenous PRN Newman Nip, NP      . dofetilide (TIKOSYN) capsule 500 mcg  500 mcg Oral BID Newman Nip, NP   500 mcg at 10/25/19 2023  . [START ON 10/26/2019] levothyroxine (SYNTHROID) tablet 175 mcg  175 mcg Oral Q0600 Newman Nip, NP      . metoprolol succinate (TOPROL-XL) 24 hr tablet 12.5  mg  12.5 mg Oral QHS Newman Nip, NP   12.5 mg at 10/25/19 2023  . rivaroxaban (XARELTO) tablet 20 mg  20 mg Oral Q supper Newman Nip, NP   20 mg at 10/25/19 1815  . sodium chloride flush (NS) 0.9 % injection 3 mL  3 mL Intravenous Q12H Rudi Coco C, NP      . sodium chloride flush (NS) 0.9 % injection 3 mL  3 mL Intravenous PRN Newman Nip, NP      . Melene Muller ON 10/26/2019] Tenofovir Alafenamide Fumarate TABS 25 mg  25 mg Oral Daily Newman Nip, NP        Allergies  Allergen Reactions  . Bee Venom Swelling    Social History   Socioeconomic History  . Marital status: Married    Spouse name: [name], retail  . Number of children: Not on file  . Years of  education: Not on file  . Highest education level: Not on file  Occupational History  . Not on file  Tobacco Use  . Smoking status: Never Smoker  . Smokeless tobacco: Never Used  Substance and Sexual Activity  . Alcohol use: Not Currently    Alcohol/week: 1.0 - 2.0 standard drinks    Types: 1 - 2 Cans of beer per week  . Drug use: No  . Sexual activity: Not Currently  Other Topics Concern  . Not on file  Social History Narrative   Lives in [place] with spouse.   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 Difficulty of Paying Living Expenses:   Food Insecurity:   . Worried About Programme researcher, broadcasting/film/video in the Last Year:   . Barista in the Last Year:   Transportation Needs:   . Freight forwarder (Medical):   Marland Kitchen Lack of Transportation (Non-Medical):   Physical Activity:   . Days of Exercise per Week:   . Minutes of Exercise per Session:   Stress:   . Feeling of Stress :   Social Connections:   . Frequency of Communication with Friends and Family:   . Frequency of Social Gatherings with Friends and Family:   . Attends Religious Services:   . Active Member of Clubs or Organizations:   . Attends Banker Meetings:   Marland Kitchen Marital Status:   Intimate Partner Violence:   . Fear of Current or Ex-Partner:   . Emotionally Abused:   Marland Kitchen Physically Abused:   . Sexually Abused:     Family History  Problem Relation Age of Onset  . Cancer Mother        skin  . Atrial fibrillation Mother     ROS- All systems are reviewed and negative except as per the HPI above  Physical Exam: Vitals:   10/25/19 1611 10/25/19 1617 10/25/19 2007  BP:  131/79 122/87  Pulse:   71  Resp:  16 15  Temp:  98.2 F (36.8 C) 97.8 F (36.6 C)  TempSrc:  Oral Oral  SpO2:  99% 100%  Weight: 93.5 kg    Height: 6' (1.829 m)     Wt Readings from Last 3 Encounters:  10/25/19 93.5 kg  10/25/19 94.2 kg  09/08/19 98.8  kg    Labs: Lab Results  Component Value Date   NA 140 10/25/2019   K 4.1 10/25/2019   CL 107 10/25/2019   CO2 26 10/25/2019  GLUCOSE 82 10/25/2019   BUN 12 10/25/2019   CREATININE 0.75 10/25/2019   CALCIUM 9.4 10/25/2019   MG 2.2 10/25/2019   No results found for: INR No results found for: CHOL, HDL, LDLCALC, TRIG   GEN- The patient is well appearing, alert and oriented x 3 today.   Head- normocephalic, atraumatic Eyes-  Sclera clear, conjunctiva pink Ears- hearing intact Oropharynx- clear Neck- supple, no JVP Lymph- no cervical lymphadenopathy Lungs- Clear to ausculation bilaterally, normal work of breathing Heart-irregular rate and rhythm, no murmurs, rubs or gallops, PMI not laterally displaced GI- soft, NT, ND, + BS Extremities- no clubbing, cyanosis, or edema MS- no significant deformity or atrophy Skin- no rash or lesion Psych- euthymic mood, full affect Neuro- strength and sensation are intact  EKG- afib at 63 bpm, qrs int 86 ms, qtc 419 ms Echo-IMPRESSIONS    1. Left ventricular ejection fraction, by visual estimation, is 60 to  65%. The left ventricle has normal function. Normal left ventricular size.  There is no left ventricular hypertrophy.  2. Left ventricular diastolic function could not be evaluated pattern of  LV diastolic filling.  3. Global right ventricle has normal systolic function.The right  ventricular size is normal. No increase in right ventricular wall  thickness.  4. Left atrial size was normal.  5. Right atrial size was normal.  6. The mitral valve is normal in structure. Trace mitral valve  regurgitation. No evidence of mitral stenosis.  7. The tricuspid valve is normal in structure. Tricuspid valve  regurgitation is trivial.  8. The aortic valve is tricuspid Aortic valve regurgitation is moderate  by color flow Doppler. Structurally normal aortic valve, with no evidence  of sclerosis or stenosis.  9. The pulmonic valve  was grossly normal. Pulmonic valve regurgitation is  trivial by color flow Doppler.  10. Mildly elevated pulmonary artery systolic pressure.  11. The inferior vena cava is dilated in size with <50% respiratory  variability, suggesting right atrial pressure of 15 mmHg.   FINDINGS  Left Ventricle: Left ventricular ejection fraction, by visual estimation,  is 60 to 65%. The left ventricle has normal function. No evidence of left  ventricular regional wall motion abnormalities. There is no left  ventricular hypertrophy. Normal left  ventricular size. Spectral Doppler shows Left ventricular diastolic  function could not be evaluated pattern of LV diastolic filling. Normal left atrial size     Assessment and Plan: 1. Persistent symptomatic  afib since March 2020 Failed flecainide load and cardioversion 09/01/19 Off flecainide for  several weeks  The pt/wife really want to pursue him getting back in rhythm as the fatigue has worsened and is affecting his quality of life I think his only option at this point to pursue Tikosyn admit, on young side for amiodarone  No benadryl use He could get the drug thru [facility] or his new insurance group but unaware of the price of the drug thru insurance as of yet, can afford good RX His qtc is in good range for tikosyn Drugs screened and trazodone /benadryl have been stopped  many weeks ago  Crcl cal is 137.36 indicating will start on 500 mcg bid of tikosyn Bmet/mag with good K+/mag levels  covid screened negative   2. CHA2DS2VASc score of 0 He is currently taking xarleto 20 mg daily, hopefully will not require anticoagulatiion long term for cost issues, no missed doses for at least 3 weeeks  Bed is not  ready for admission , will report  home and come back when admission notifies     Geroge Baseman. [place], [facility] [address] [place], [place] [address] [phone]     I have seen, examined the patient, and  reviewed the above assessment and plan.  Changes to above are made where necessary.  On exam, iRRR.  The patient has symptomatic persistent afib.  He reports compliance with xarelto without interruption.  We will initiate tikosyn at this time.  Risks and benefits to [place] were discussed with the patient and family who wishes to proceed.  Co Sign: Thompson Grayer, MD 10/25/2019 8:56 PM

## 2019-10-25 NOTE — Telephone Encounter (Signed)
preauthorization dates for admission approved for 4/20

## 2019-10-26 LAB — BASIC METABOLIC PANEL
Anion gap: 8 (ref 5–15)
BUN: 14 mg/dL (ref 8–23)
CO2: 26 mmol/L (ref 22–32)
Calcium: 9.4 mg/dL (ref 8.9–10.3)
Chloride: 107 mmol/L (ref 98–111)
Creatinine, Ser: 0.95 mg/dL (ref 0.61–1.24)
GFR calc Af Amer: >60 mL/min (ref >60)
GFR calc non Af Amer: >60 mL/min (ref >60)
Glucose, Bld: 102 mg/dL — ABNORMAL HIGH (ref 70–99)
Potassium: 3.8 mmol/L (ref 3.5–5.1)
Sodium: 141 mmol/L (ref 135–145)

## 2019-10-26 LAB — MAGNESIUM: Magnesium: 2.1 mg/dL (ref 1.7–2.4)

## 2019-10-26 LAB — HIV ANTIBODY (ROUTINE TESTING W REFLEX): HIV Screen 4th Generation wRfx: NONREACTIVE

## 2019-10-26 MED ORDER — POTASSIUM CHLORIDE CRYS ER 20 MEQ PO TBCR: 20.0000 meq | EXTENDED_RELEASE_TABLET | Freq: Once | ORAL | Status: DC

## 2019-10-26 MED ORDER — POTASSIUM CHLORIDE CRYS ER 20 MEQ PO TBCR
40.0000 meq | EXTENDED_RELEASE_TABLET | Freq: Once | ORAL | Status: AC
  Administered 2019-10-26: 40 meq via ORAL
  Filled 2019-10-26: qty 2

## 2019-10-26 MED ORDER — ZOLPIDEM TARTRATE 5 MG PO TABS
10.0000 mg | ORAL_TABLET | Freq: Every evening | ORAL | Status: DC | PRN
  Administered 2019-10-26 – 2019-10-27 (×2): 10 mg via ORAL
  Filled 2019-10-26 (×2): qty 2

## 2019-10-26 NOTE — Progress Notes (Addendum)
Electrophysiology Rounding Note  Patient Name: Joshua Lowe Date of Encounter: 10/26/2019  Primary Cardiologist: Mertie Moores, MD  Electrophysiologist: None    Subjective   Pt remains in afib on Tikosyn 500 mcg BID   QTc from EKG last pm shows stable QTc at 446 ms  The patient is doing well today.  At this time, the patient denies chest pain, shortness of breath, or any new concerns. He did not sleep well last night. Would like to make sure he gets his correct dose of Ambien.  Inpatient Medications    Scheduled Meds: . dofetilide  500 mcg Oral BID  . levothyroxine  175 mcg Oral Q0600  . metoprolol succinate  12.5 mg Oral QHS  . rivaroxaban  20 mg Oral Q supper  . sodium chloride flush  3 mL Intravenous Q12H  . Tenofovir Alafenamide Fumarate  25 mg Oral Daily   Continuous Infusions: . sodium chloride     PRN Meds: sodium chloride, sodium chloride flush, zolpidem   Vital Signs    Vitals:   10/25/19 1617 10/25/19 2007 10/25/19 2348 10/26/19 0521  BP: 131/79 122/87 124/70 124/87  Pulse:  71 86 68  Resp: 16 15 19    Temp: 98.2 F (36.8 C) 97.8 F (36.6 C) (!) 97.4 F (36.3 C) 97.8 F (36.6 C)  TempSrc: Oral Oral Oral Oral  SpO2: 99% 100% 99% 100%  Weight:    92 kg  Height:       No intake or output data in the 24 hours ending 10/26/19 0755 Filed Weights   10/25/19 1611 10/26/19 0521  Weight: 93.5 kg 92 kg    Physical Exam    GEN- The patient is well appearing, alert and oriented x 3 today.   Head- normocephalic, atraumatic Eyes-  Sclera clear, conjunctiva pink Ears- hearing intact Oropharynx- clear Neck- supple Lungs- Clear to ausculation bilaterally, normal work of breathing Heart- Regular rate and rhythm, no murmurs, rubs or gallops GI- soft, NT, ND, + BS Extremities- no clubbing, cyanosis, or edema Skin- no rash or lesion Psych- euthymic mood, full affect Neuro- strength and sensation are intact  Labs    CBC No results for input(s): WBC,  NEUTROABS, HGB, HCT, MCV, PLT in the last 72 hours. Basic Metabolic Panel Recent Labs    10/25/19 1125 10/26/19 0410  NA 140 141  K 4.1 3.8  CL 107 107  CO2 26 26  GLUCOSE 82 102*  BUN 12 14  CREATININE 0.75 0.95  CALCIUM 9.4 9.4  MG 2.2 2.1    Potassium  Date/Time Value Ref Range Status  10/26/2019 04:10 AM 3.8 3.5 - 5.1 mmol/L Final   Magnesium  Date/Time Value Ref Range Status  10/26/2019 04:10 AM 2.1 1.7 - 2.4 mg/dL Final    Comment:    Performed at [facility], [place] [address], [place], [place] [address]    Telemetry    Atrial fibrillation 60-80s (personally reviewed)  Radiology    No results found.   Patient Profile     Joshua Lowe is a 64 y.o. male with a past medical history significant for persistent atrial fibrillation.  They were admitted for tikosyn load.   Assessment & Plan    1. Persistent atrial fibrillation Pt remains in afib on Tikosyn 500 mcg BID  Continue Xarelto Mg stable. K 3.8. Will supp.  CHA2DS2VASC is 0. May be able to consider coming off [place] after 3+ months of NSR.    If pt  does not convert chemically, plan on DCCV tomorrow   For questions or updates, please contact [facility] Please consult [email or web address] for contact info under Cardiology/STEMI.  Signed, Graciella Freer, PA-C  10/26/2019, 7:55 AM    I have seen, examined the patient, and reviewed the above assessment and plan.  Changes to above are made where necessary.  On exam, iRRR.  Will plan cardioversion tomorrow if he does not convert in the interim.  Co Sign: Hillis Range, MD

## 2019-10-26 NOTE — Progress Notes (Signed)
Morning EKG reviewed  Shows pt remains in afib at 72 bpm with stable QTc ~470 ms.  Continue Tikosyn 500 mcg BID.   Pt will be NPO after midnight for DCCV if remains in afib   Graciella Freer, [place]  Pager: [phone]  10/26/2019 12:35 PM

## 2019-10-26 NOTE — TOC Benefit Eligibility Note (Addendum)
Transition of Care [facility]) Benefit Eligibility Note    Patient Details  Name: AREEB CORRON MRN: [id number] Date of Birth: 1955-11-13   Medication/Dose: DOFETILIDE  125 MG BID , CO-PAY- $30.53 ,  DOFETILIDE   250 MG BID , CO-PAY- $30.53  , DOFETILIDE 500 MG BID , CO-PAY- $30.53  Covered?: Yes  Tier: 3 Drug  Prescription Coverage Preferred Pharmacy: [facility]  Spoke with Person/Company/Phone Number:: LADAWN  @   [employer]  RX # [phone]  Co-Pay: $30.53  Prior Approval: No  Deductible: Unmet(OUIT-OF-POCKET-UNMET)   ADDITIONAL NOTES: TIKOSYN : NON-FORMULARY  P/A-YES # [phone]    Mardene Sayer Phone Number: 10/26/2019, 11:44 AM

## 2019-10-26 NOTE — Progress Notes (Signed)
Patient made aware of co- pay for Tikosyn 30.53.  Checked with his pharmacy [facility], they do not have 500mg , but have 250mg  120 pills, and a small amount of 125mg  pills. Suggest TOC pharmacy for first dose and then transfer to his pharmacy for subsequent dosing, as they have to order 1-2 days ahead of time for refill.

## 2019-10-26 NOTE — Progress Notes (Signed)
Pharmacy: Dofetilide (Tikosyn) - Follow Up Assessment and Electrolyte Replacement  Pharmacy consulted to assist in monitoring and replacing electrolytes in this 64 y.o. male admitted on 10/25/2019 undergoing dofetilide initiation. First dofetilide dose: 10/25/19  Labs:    Component Value Date/Time   K 3.8 10/26/2019 0410   MG 2.1 10/26/2019 0410     Plan: Potassium: K 3.8-3.9:  Give KCl 40 mEq po x1   Magnesium: Mg > 2: No additional supplementation needed   Thank you for allowing pharmacy to participate in this patient's care   Sheppard Coil PharmD., BCPS Clinical Pharmacist 10/26/2019 11:59 AM

## 2019-10-26 NOTE — Progress Notes (Signed)
Eligibility sent for tikosyn/diofetilide 500/250/125 mg. Will inform patient about co-pay when information received.

## 2019-10-26 NOTE — Plan of Care (Signed)
  Problem: Education: Goal: Knowledge of General Education information will improve Description: Including pain rating scale, medication(s)/side effects and non-pharmacologic comfort measures Outcome: Progressing   Problem: Health Behavior/Discharge Planning: Goal: Ability to manage health-related needs will improve Outcome: Progressing   Problem: Clinical Measurements: Goal: Ability to maintain clinical measurements within normal limits will improve Outcome: Progressing Goal: Will remain free from infection Outcome: Progressing   Problem: Pain Managment: Goal: General experience of comfort will improve Outcome: Progressing   

## 2019-10-27 ENCOUNTER — Inpatient Hospital Stay (HOSPITAL_COMMUNITY): Payer: 59 | Admitting: Certified Registered Nurse Anesthetist

## 2019-10-27 ENCOUNTER — Encounter (HOSPITAL_COMMUNITY)
Admission: AD | Disposition: A | Discharge status: Home / Self Care | Payer: Self-pay | Source: Ambulatory Visit | Attending: Internal Medicine

## 2019-10-27 ENCOUNTER — Encounter (HOSPITAL_COMMUNITY): Payer: Self-pay | Admitting: Internal Medicine

## 2019-10-27 DIAGNOSIS — E039 Hypothyroidism, unspecified: Secondary | ICD-10-CM

## 2019-10-27 HISTORY — PX: CARDIOVERSION: SHX1299

## 2019-10-27 LAB — BASIC METABOLIC PANEL WITH GFR
Anion gap: 9 (ref 5–15)
BUN: 11 mg/dL (ref 8–23)
CO2: 26 mmol/L (ref 22–32)
Calcium: 9.5 mg/dL (ref 8.9–10.3)
Chloride: 106 mmol/L (ref 98–111)
Creatinine, Ser: 0.96 mg/dL (ref 0.61–1.24)
GFR calc Af Amer: >60 mL/min
GFR calc non Af Amer: >60 mL/min
Glucose, Bld: 98 mg/dL (ref 70–99)
Potassium: 4.6 mmol/L (ref 3.5–5.1)
Sodium: 141 mmol/L (ref 135–145)

## 2019-10-27 LAB — PROTIME-INR
INR: 1.7 — ABNORMAL HIGH (ref 0.8–1.2)
Prothrombin Time: 19.8 seconds — ABNORMAL HIGH (ref 11.4–15.2)

## 2019-10-27 LAB — MAGNESIUM: Magnesium: 2.1 mg/dL (ref 1.7–2.4)

## 2019-10-27 SURGERY — CARDIOVERSION
Anesthesia: General

## 2019-10-27 MED ORDER — PHENYLEPHRINE 40 MCG/ML (10ML) SYRINGE FOR IV PUSH (FOR BLOOD PRESSURE SUPPORT)
PREFILLED_SYRINGE | INTRAVENOUS | Status: DC | PRN
  Administered 2019-10-27: 120 ug via INTRAVENOUS

## 2019-10-27 MED ORDER — LIDOCAINE 2% (20 MG/ML) 5 ML SYRINGE
INTRAMUSCULAR | Status: DC | PRN
  Administered 2019-10-27: 60 mg via INTRAVENOUS

## 2019-10-27 MED ORDER — SODIUM CHLORIDE 0.9 % IV SOLN: INTRAVENOUS | Status: DC

## 2019-10-27 MED ORDER — PROPOFOL 10 MG/ML IV BOLUS
INTRAVENOUS | Status: DC | PRN
  Administered 2019-10-27 (×2): 50 mg via INTRAVENOUS

## 2019-10-27 NOTE — Procedures (Signed)
Electrical Cardioversion Procedure Note Joshua Lowe [id number] 1955-07-24  Procedure: Electrical Cardioversion Indications:  Atrial Fibrillation  Procedure Details Consent: Risks of procedure as well as the alternatives and risks of each were explained to the (patient/caregiver).  Consent for procedure obtained. Time Out: Verified patient identification, verified procedure, site/side was marked, verified correct patient position, special equipment/implants available, medications/allergies/relevent history reviewed, required imaging and test results available.  Performed  Patient placed on cardiac monitor, pulse oximetry, supplemental oxygen as necessary.  Sedation given: Pt sedated by anesthesia with lidocaine 60 mg and diprovan 100 mg IV. Pacer pads placed anterior and posterior chest.  Cardioverted 1 time(s).  Cardioverted at 120J.  Evaluation Findings: Post procedure EKG shows: Sinus bradycardia Complications: None Patient did tolerate procedure well.   [name] 10/27/2019, 10:33 AM

## 2019-10-27 NOTE — H&P (View-Only) (Signed)
   Electrophysiology Rounding Note  Patient Name: Joshua Lowe Date of Encounter: 10/27/2019  Primary Cardiologist: Nahser Electrophysiologist: Jeramiah Mccaughey   Subjective   The patient is doing well today.  At this time, the patient denies chest pain, shortness of breath, or any new concerns.  Inpatient Medications    Scheduled Meds: . dofetilide  500 mcg Oral BID  . levothyroxine  175 mcg Oral Q0600  . metoprolol succinate  12.5 mg Oral QHS  . rivaroxaban  20 mg Oral Q supper  . sodium chloride flush  3 mL Intravenous Q12H  . Tenofovir Alafenamide Fumarate  25 mg Oral Daily   Continuous Infusions: . sodium chloride     PRN Meds: sodium chloride, sodium chloride flush, zolpidem   Vital Signs    Vitals:   10/26/19 0521 10/26/19 1548 10/26/19 2026 10/27/19 0516  BP: 124/87 119/71 121/81 119/76  Pulse: 68 73 60 69  Resp:  16 17 17   Temp: 97.8 F (36.6 C) 98.4 F (36.9 C) 97.6 F (36.4 C) 98.2 F (36.8 C)  TempSrc: Oral Oral Oral Oral  SpO2: 100% 100% 100% 96%  Weight: 92 kg   90.4 kg  Height:        Intake/Output Summary (Last 24 hours) at 10/27/2019 0745 Last data filed at 10/26/2019 2000 Gross per 24 hour  Intake 720 ml  Output --  Net 720 ml   Filed Weights   10/25/19 1611 10/26/19 0521 10/27/19 0516  Weight: 93.5 kg 92 kg 90.4 kg    Physical Exam    GEN- The patient is well appearing, alert and oriented x 3 today.   Head- normocephalic, atraumatic Eyes-  Sclera clear, conjunctiva pink Ears- hearing intact Oropharynx- clear Neck- supple Lungs- Clear to ausculation bilaterally, normal work of breathing Heart- Irregular rate and rhythm  GI- soft, NT, ND, + BS Extremities- no clubbing, cyanosis, or edema Skin- no rash or lesion Psych- euthymic mood, full affect Neuro- strength and sensation are intact  Labs    Basic Metabolic Panel Recent Labs    10/29/19 0410 10/26/19 2323  NA 141 141  K 3.8 4.6  CL 107 106  CO2 26 26  GLUCOSE 102* 98  BUN  14 11  CREATININE 0.95 0.96  CALCIUM 9.4 9.5  MG 2.1 2.1     Telemetry    Rate controlled AF (personally reviewed)  Radiology    No results found.   Patient Profile     LAVALLE SKODA is a 64 y.o. male admitted for Tikosyn load   Assessment & Plan    1.  Persistent atrial fibrillation Admitted for Tikosyn load Labs and QTc stable Plan DCCV today Continue Xarelto  For questions or updates, please contact [facility] Please consult [email or web address] for contact info under Cardiology/STEMI.  Signed, 77, NP  10/27/2019, 7:45 AM    I have seen, examined the patient, and reviewed the above assessment and plan.  Changes to above are made where necessary.  On exam, iRRR.  Will plan cardioversion today.  Risks and benefits to cardioversion were discussed with the patient who wishes to proceed. Will continue synthroid for hypothyroidism.  Co Sign: 10/29/2019, MD 10/27/2019 8:12 AM

## 2019-10-27 NOTE — Anesthesia Postprocedure Evaluation (Signed)
Anesthesia Post Note  Patient: Joshua Lowe  Procedure(s) Performed: CARDIOVERSION (N/A )     Patient location during evaluation: PACU Anesthesia Type: General Level of consciousness: awake and alert Pain management: pain level controlled Vital Signs Assessment: post-procedure vital signs reviewed and stable Respiratory status: spontaneous breathing, nonlabored ventilation, respiratory function stable and patient connected to nasal cannula oxygen Cardiovascular status: blood pressure returned to baseline and stable Postop Assessment: no apparent nausea or vomiting Anesthetic complications: no    Last Vitals:  Vitals:   10/27/19 1100 10/27/19 1122  BP: 120/73 108/70  Pulse: (!) 51 (!) 53  Resp: (!) 21 20  Temp:    SpO2: 99% 100%    Last Pain:  Vitals:   10/27/19 1100  TempSrc:   PainSc: 0-No pain                 Shelton Silvas

## 2019-10-27 NOTE — Transfer of Care (Signed)
Immediate Anesthesia Transfer of Care Note  Patient: Joshua Lowe  Procedure(s) Performed: CARDIOVERSION (N/A )  Patient Location: Endoscopy Unit  Anesthesia Type:General  Level of Consciousness: awake  Airway & Oxygen Therapy: Patient Spontanous Breathing  Post-op Assessment: Report given to RN  Post vital signs: Reviewed  Last Vitals:  Vitals Value Taken Time  BP    Temp    Pulse    Resp    SpO2      Last Pain:  Vitals:   10/27/19 0954  TempSrc:   PainSc: 0-No pain      Patients Stated Pain Goal: 0 (10/26/19 0831)  Complications: No apparent anesthesia complications

## 2019-10-27 NOTE — Progress Notes (Signed)
Post cardioversion EKG reviewed QTc stable  Plan discharge home tomorrow  Gypsy Balsam, NP 10/27/2019 11:10 AM

## 2019-10-27 NOTE — Care Management (Signed)
1449 10-27-19 Tikosyn Load! Patient is aware of co pay for Tikosyn. Case Manager spoke with patient and wife regarding Tikosyn cost and where to purchase. Plan will be to e-scribe a Rx to [facility] for 30 day supply with refills. [facility] and 500 mcg/250 mcg are in stock. No further needs from Case Manager at this time Graves-Bigelow, Lamar Laundry, RN,BSN Case Manager

## 2019-10-27 NOTE — Progress Notes (Signed)
Pharmacy: Dofetilide (Tikosyn) - Follow Up Assessment and Electrolyte Replacement  Pharmacy consulted to assist in monitoring and replacing electrolytes in this 64 y.o. male admitted on 10/25/2019 undergoing dofetilide initiation. First dofetilide dose: 10/25/19  Labs:    Component Value Date/Time   K 4.6 10/26/2019 2323   MG 2.1 10/26/2019 2323     Plan: Potassium: K >/= 4: No additional supplementation needed  Magnesium: Mg > 2: No additional supplementation needed  At this time patient will likely not need any potassium supplements at discharge.   Thank you for allowing pharmacy to participate in this patient's care   Sheppard Coil PharmD., BCPS Clinical Pharmacist 10/27/2019 7:43 AM

## 2019-10-27 NOTE — Anesthesia Preprocedure Evaluation (Addendum)
Anesthesia Evaluation  Patient identified by MRN, date of birth, ID band Patient awake    Reviewed: Allergy & Precautions, NPO status , Patient's Chart, lab work & pertinent test results  Airway Mallampati: II  TM Distance: >3 FB Neck ROM: Full    Dental  (+) Teeth Intact, Dental Advisory Given   Pulmonary neg pulmonary ROS,    Pulmonary exam normal breath sounds clear to auscultation       Cardiovascular + dysrhythmias Atrial Fibrillation  Rhythm:Irregular Rate:Abnormal     Neuro/Psych negative neurological ROS  negative psych ROS   GI/Hepatic negative GI ROS, (+) Hepatitis -  Endo/Other  Hypothyroidism Hyperthyroidism   Renal/GU negative Renal ROS     Musculoskeletal  (+) Arthritis ,   Abdominal   Peds  Hematology negative hematology ROS (+)   Anesthesia Other Findings   Reproductive/Obstetrics                            Echo:  1. Left ventricular ejection fraction, by visual estimation, is 60 to  65%. The left ventricle has normal function. Normal left ventricular size.  There is no left ventricular hypertrophy.  2. Left ventricular diastolic function could not be evaluated pattern of  LV diastolic filling.  3. Global right ventricle has normal systolic function.The right  ventricular size is normal. No increase in right ventricular wall  thickness.  4. Left atrial size was normal.  5. Right atrial size was normal.  6. The mitral valve is normal in structure. Trace mitral valve  regurgitation. No evidence of mitral stenosis.  7. The tricuspid valve is normal in structure. Tricuspid valve  regurgitation is trivial.  8. The aortic valve is tricuspid Aortic valve regurgitation is moderate  by color flow Doppler. Structurally normal aortic valve, with no evidence  of sclerosis or stenosis.  9. The pulmonic valve was grossly normal. Pulmonic valve regurgitation is  trivial by  color flow Doppler.  10. Mildly elevated pulmonary artery systolic pressure.  11. The inferior vena cava is dilated in size with <50% respiratory  variability, suggesting right atrial pressure of 15 mmHg.  Anesthesia Physical Anesthesia Plan  ASA: III  Anesthesia Plan: General   Post-op Pain Management:    Induction: Intravenous  PONV Risk Score and Plan: 0  Airway Management Planned: Natural Airway and Simple Face Mask  Additional Equipment: None  Intra-op Plan:   Post-operative Plan:   Informed Consent: I have reviewed the patients History and Physical, chart, labs and discussed the procedure including the risks, benefits and alternatives for the proposed anesthesia with the patient or authorized representative who has indicated his/her understanding and acceptance.     Dental advisory given  Plan Discussed with: CRNA  Anesthesia Plan Comments:        Anesthesia Quick Evaluation

## 2019-10-27 NOTE — Progress Notes (Addendum)
   Electrophysiology Rounding Note  Patient Name: Joshua Lowe Date of Encounter: 10/27/2019  Primary Cardiologist: Nahser Electrophysiologist: Brookie Wayment   Subjective   The patient is doing well today.  At this time, the patient denies chest pain, shortness of breath, or any new concerns.  Inpatient Medications    Scheduled Meds: . dofetilide  500 mcg Oral BID  . levothyroxine  175 mcg Oral Q0600  . metoprolol succinate  12.5 mg Oral QHS  . rivaroxaban  20 mg Oral Q supper  . sodium chloride flush  3 mL Intravenous Q12H  . Tenofovir Alafenamide Fumarate  25 mg Oral Daily   Continuous Infusions: . sodium chloride     PRN Meds: sodium chloride, sodium chloride flush, zolpidem   Vital Signs    Vitals:   10/26/19 0521 10/26/19 1548 10/26/19 2026 10/27/19 0516  BP: 124/87 119/71 121/81 119/76  Pulse: 68 73 60 69  Resp:  16 17 17  Temp: 97.8 F (36.6 C) 98.4 F (36.9 C) 97.6 F (36.4 C) 98.2 F (36.8 C)  TempSrc: Oral Oral Oral Oral  SpO2: 100% 100% 100% 96%  Weight: 92 kg   90.4 kg  Height:        Intake/Output Summary (Last 24 hours) at 10/27/2019 0745 Last data filed at 10/26/2019 2000 Gross per 24 hour  Intake 720 ml  Output --  Net 720 ml   Filed Weights   10/25/19 1611 10/26/19 0521 10/27/19 0516  Weight: 93.5 kg 92 kg 90.4 kg    Physical Exam    GEN- The patient is well appearing, alert and oriented x 3 today.   Head- normocephalic, atraumatic Eyes-  Sclera clear, conjunctiva pink Ears- hearing intact Oropharynx- clear Neck- supple Lungs- Clear to ausculation bilaterally, normal work of breathing Heart- Irregular rate and rhythm  GI- soft, NT, ND, + BS Extremities- no clubbing, cyanosis, or edema Skin- no rash or lesion Psych- euthymic mood, full affect Neuro- strength and sensation are intact  Labs    Basic Metabolic Panel Recent Labs    10/26/19 0410 10/26/19 2323  NA 141 141  K 3.8 4.6  CL 107 106  CO2 26 26  GLUCOSE 102* 98  BUN  14 11  CREATININE 0.95 0.96  CALCIUM 9.4 9.5  MG 2.1 2.1     Telemetry    Rate controlled AF (personally reviewed)  Radiology    No results found.   Patient Profile     Joshua Lowe is a 63 y.o. male admitted for Tikosyn load   Assessment & Plan    1.  Persistent atrial fibrillation Admitted for Tikosyn load Labs and QTc stable Plan DCCV today Continue Xarelto  For questions or updates, please contact [facility] Please consult [email or web address] for contact info under Cardiology/STEMI.  Signed, Amber Seiler, NP  10/27/2019, 7:45 AM    I have seen, examined the patient, and reviewed the above assessment and plan.  Changes to above are made where necessary.  On exam, iRRR.  Will plan cardioversion today.  Risks and benefits to cardioversion were discussed with the patient who wishes to proceed. Will continue synthroid for hypothyroidism.  Co Sign: Greysen Swanton, MD 10/27/2019 8:12 AM   

## 2019-10-27 NOTE — Anesthesia Procedure Notes (Signed)
Procedure Name: General with mask airway Date/Time: 10/27/2019 10:38 AM Performed by: Audie Pinto, CRNA Pre-anesthesia Checklist: Patient identified, Emergency Drugs available and Suction available Oxygen Delivery Method: Ambu bag Placement Confirmation: positive ETCO2 Dental Injury: Teeth and Oropharynx as per pre-operative assessment

## 2019-10-27 NOTE — Interval H&P Note (Signed)
History and Physical Interval Note:  10/27/2019 10:32 AM  Joshua Lowe  has presented today for surgery, with the diagnosis of afib.  The various methods of treatment have been discussed with the patient and family. After consideration of risks, benefits and other options for treatment, the patient has consented to  Procedure(s): CARDIOVERSION (N/A) as a surgical intervention.  The patient's history has been reviewed, patient examined, no change in status, stable for surgery.  I have reviewed the patient's chart and labs.  Questions were answered to the patient's satisfaction.     [name]

## 2019-10-28 ENCOUNTER — Other Ambulatory Visit (HOSPITAL_COMMUNITY): Payer: Self-pay | Admitting: *Deleted

## 2019-10-28 LAB — BASIC METABOLIC PANEL
Anion gap: 8 (ref 5–15)
BUN: 11 mg/dL (ref 8–23)
CO2: 26 mmol/L (ref 22–32)
Calcium: 9.2 mg/dL (ref 8.9–10.3)
Chloride: 107 mmol/L (ref 98–111)
Creatinine, Ser: 0.88 mg/dL (ref 0.61–1.24)
GFR calc Af Amer: >60 mL/min (ref >60)
GFR calc non Af Amer: >60 mL/min (ref >60)
Glucose, Bld: 106 mg/dL — ABNORMAL HIGH (ref 70–99)
Potassium: 3.7 mmol/L (ref 3.5–5.1)
Sodium: 141 mmol/L (ref 135–145)

## 2019-10-28 LAB — MAGNESIUM: Magnesium: 2 mg/dL (ref 1.7–2.4)

## 2019-10-28 MED ORDER — RIVAROXABAN 20 MG PO TABS: 20.0000 mg | ORAL_TABLET | Freq: Every day | ORAL | 6 refills | Status: DC

## 2019-10-28 MED ORDER — POTASSIUM CHLORIDE CRYS ER 20 MEQ PO TBCR
60.0000 meq | EXTENDED_RELEASE_TABLET | Freq: Once | ORAL | Status: AC
  Administered 2019-10-28: 60 meq via ORAL
  Filled 2019-10-28: qty 3

## 2019-10-28 MED ORDER — DOFETILIDE 500 MCG PO CAPS: 500.0000 ug | ORAL_CAPSULE | Freq: Two times a day (BID) | ORAL | 0 refills | Status: DC

## 2019-10-28 MED FILL — DOFETILIDE 500 MCG CAPS: 500 | 30 days supply | Qty: 60 | Fill #0

## 2019-10-28 NOTE — Progress Notes (Signed)
Pharmacy: Dofetilide (Tikosyn) - Follow Up Assessment and Electrolyte Replacement  Pharmacy consulted to assist in monitoring and replacing electrolytes in this 64 y.o. male admitted on 10/25/2019 undergoing dofetilide initiation. First dofetilide dose: 10/25/19  Labs:    Component Value Date/Time   K 3.7 10/28/2019 0352   MG 2.0 10/28/2019 0352     Plan: Potassium: K 3.5-3.7:  Give KCl 60 mEq po x1   Magnesium: Mg > 2: No additional supplementation needed  Discussed with EP and will give supplement this morning but do not feel he needs a prescription at d/c.   Thank you for allowing pharmacy to participate in this patient's care   Sheppard Coil PharmD., BCPS Clinical Pharmacist 10/28/2019 9:54 AM

## 2019-10-28 NOTE — Discharge Summary (Signed)
ELECTROPHYSIOLOGY PROCEDURE DISCHARGE SUMMARY    Patient ID: Joshua Lowe,  MRN: [id number], DOB/AGE: September 04, 1955 64 y.o.  Admit date: 10/25/2019 Discharge date: 10/28/2019  Primary Care Physician: Wenda Low, MD Primary Cardiologist: Nahser Electrophysiologist: [name]  Primary Discharge Diagnosis:  1.  Persistent atrial fibrillation status post Tikosyn loading this admission  Secondary Discharge Diagnosis:  1.  Graves disease 2.  Sleep disordered breathing  Allergies  Allergen Reactions  . Bee Venom Swelling     Procedures This Admission:  1.  Tikosyn loading 2.  Direct current cardioversion on 10/27/19 by Dr Stanford Breed which successfully restored SR.  There were no early apparent complications.   Brief HPI: Joshua Lowe is a 64 y.o. male with a past medical history as noted above.  They were referred to EP in the outpatient setting for treatment options of atrial fibrillation.  Risks, benefits, and alternatives to Tikosyn were reviewed with the patient who wished to proceed.    Hospital Course:  The patient was admitted and Tikosyn was initiated.  Renal function and electrolytes were followed during the hospitalization.  Their QTc remained stable.  On 10/27/19 they underwent direct current cardioversion which restored sinus rhythm.  They were monitored until discharge on telemetry which demonstrated SR.  On the day of discharge, they were examined by Dr Rayann Heman who considered them stable for discharge to home.  Follow-up has been arranged with AF clinic in 1 week.  Physical Exam: Vitals:   10/27/19 1122 10/27/19 1546 10/27/19 2203 10/28/19 0359  BP: 108/70 104/66 110/80 116/83  Pulse: (!) 53 62 (!) 57 64  Resp: 20 18 19 18   Temp:  (!) 97.4 F (36.3 C) 98.1 F (36.7 C) 98 F (36.7 C)  TempSrc:  Oral Oral Oral  SpO2: 100% 100% 100% 99%  Weight:      Height:        GEN- The patient is well appearing, alert and oriented x 3 today.   HEENT: normocephalic,  atraumatic; sclera clear, conjunctiva pink; hearing intact; oropharynx clear; neck supple  Lungs- Clear to ausculation bilaterally, normal work of breathing.  No wheezes, rales, rhonchi Heart- Regular rate and rhythm  GI- soft, non-tender, non-distended, bowel sounds present  Extremities- no clubbing, cyanosis, or edema  MS- no significant deformity or atrophy Skin- warm and dry, no rash or lesion Psych- euthymic mood, full affect Neuro- strength and sensation are intact   Labs:   Lab Results  Component Value Date   WBC 5.6 08/24/2019   HGB 15.2 08/24/2019   HCT 46.2 08/24/2019   MCV 94.3 08/24/2019   PLT 221 08/24/2019    Recent Labs  Lab 10/28/19 0352  NA 141  K 3.7  CL 107  CO2 26  BUN 11  CREATININE 0.88  CALCIUM 9.2  GLUCOSE 106*     Discharge Medications:  Allergies as of 10/28/2019      Reactions   Bee Venom Swelling      Medication List    TAKE these medications   dofetilide 500 MCG capsule Commonly known as: TIKOSYN Take 1 capsule (500 mcg total) by mouth 2 (two) times daily.   HYDROcodone-acetaminophen 5-325 MG tablet Commonly known as: NORCO/VICODIN Take 1 tablet by mouth as needed (pain.).   hydrOXYzine 25 MG tablet Commonly known as: ATARAX/VISTARIL Take 25 mg by mouth as needed.   levothyroxine 175 MCG tablet Commonly known as: SYNTHROID Take 175 mcg by mouth daily before breakfast.   metoprolol succinate 25 MG  24 hr tablet Commonly known as: Toprol XL Take 0.5 tablets (12.5 mg total) by mouth at bedtime.   rivaroxaban 20 MG Tabs tablet Commonly known as: XARELTO Take 20 mg by mouth daily with supper.   triamcinolone cream 0.1 % Commonly known as: KENALOG Apply 1 application topically 2 (two) times daily.   Vemlidy 25 MG Tabs Generic drug: Tenofovir Alafenamide Fumarate Take 25 mg by mouth daily.   zolpidem 10 MG tablet Commonly known as: AMBIEN Take 10 mg by mouth at bedtime as needed for sleep.       Disposition:    Follow-up Information    [facility] Follow up on 11/04/2019.   Specialty: Cardiology Why: at [facility] information: [address] [id number] Wilhe[place] [address] [phone]          Duration of Discharge Encounter: Greater than 30 minutes including physician time.  Signed, Gypsy Balsam, NP 10/28/2019 11:08 AM

## 2019-10-28 NOTE — Discharge Instructions (Signed)

## 2019-10-28 NOTE — Progress Notes (Signed)
Doing well with tikosyn load.  Remains in sinus s/p cardioversion yesterday.  Anticipate discharge to home this am with close follow-up in the AF clinic  Hillis Range MD, [facility] 10/28/2019 8:05 AM

## 2019-11-04 ENCOUNTER — Encounter (HOSPITAL_COMMUNITY): Payer: Self-pay | Admitting: Nurse Practitioner

## 2019-11-04 ENCOUNTER — Other Ambulatory Visit: Payer: Self-pay

## 2019-11-04 ENCOUNTER — Other Ambulatory Visit (HOSPITAL_COMMUNITY): Payer: Self-pay | Admitting: *Deleted

## 2019-11-04 ENCOUNTER — Ambulatory Visit (HOSPITAL_COMMUNITY)
Admit: 2019-11-04 | Discharge: 2019-11-04 | Disposition: A | Discharge status: Home / Self Care | Payer: 59 | Source: Ambulatory Visit | Attending: Nurse Practitioner | Admitting: Nurse Practitioner

## 2019-11-04 VITALS — BP 128/60 | HR 56 | Ht 72.0 in | Wt 207.0 lb

## 2019-11-04 DIAGNOSIS — Z808 Family history of malignant neoplasm of other organs or systems: Secondary | ICD-10-CM | POA: Insufficient documentation

## 2019-11-04 DIAGNOSIS — Z91030 Bee allergy status: Secondary | ICD-10-CM | POA: Insufficient documentation

## 2019-11-04 DIAGNOSIS — Z79899 Other long term (current) drug therapy: Secondary | ICD-10-CM | POA: Diagnosis not present

## 2019-11-04 DIAGNOSIS — Z79890 Hormone replacement therapy (postmenopausal): Secondary | ICD-10-CM | POA: Diagnosis not present

## 2019-11-04 DIAGNOSIS — D6869 Other thrombophilia: Secondary | ICD-10-CM | POA: Diagnosis not present

## 2019-11-04 DIAGNOSIS — M1990 Unspecified osteoarthritis, unspecified site: Secondary | ICD-10-CM | POA: Diagnosis not present

## 2019-11-04 DIAGNOSIS — Z8249 Family history of ischemic heart disease and other diseases of the circulatory system: Secondary | ICD-10-CM | POA: Insufficient documentation

## 2019-11-04 DIAGNOSIS — E890 Postprocedural hypothyroidism: Secondary | ICD-10-CM | POA: Insufficient documentation

## 2019-11-04 DIAGNOSIS — Z7901 Long term (current) use of anticoagulants: Secondary | ICD-10-CM | POA: Diagnosis not present

## 2019-11-04 DIAGNOSIS — I4819 Other persistent atrial fibrillation: Secondary | ICD-10-CM | POA: Diagnosis not present

## 2019-11-04 DIAGNOSIS — E785 Hyperlipidemia, unspecified: Secondary | ICD-10-CM | POA: Diagnosis not present

## 2019-11-04 DIAGNOSIS — I4891 Unspecified atrial fibrillation: Secondary | ICD-10-CM | POA: Diagnosis present

## 2019-11-04 LAB — MAGNESIUM: Magnesium: 2.2 mg/dL (ref 1.7–2.4)

## 2019-11-04 LAB — BASIC METABOLIC PANEL
Anion gap: 8 (ref 5–15)
BUN: 13 mg/dL (ref 8–23)
CO2: 26 mmol/L (ref 22–32)
Calcium: 9.4 mg/dL (ref 8.9–10.3)
Chloride: 105 mmol/L (ref 98–111)
Creatinine, Ser: 0.71 mg/dL (ref 0.61–1.24)
GFR calc Af Amer: >60 mL/min (ref >60)
GFR calc non Af Amer: >60 mL/min (ref >60)
Glucose, Bld: 98 mg/dL (ref 70–99)
Potassium: 4.7 mmol/L (ref 3.5–5.1)
Sodium: 139 mmol/L (ref 135–145)

## 2019-11-04 MED ORDER — DOFETILIDE 250 MCG PO CAPS: ORAL_CAPSULE | ORAL | 3 refills | Status: DC

## 2019-11-04 MED ORDER — DOFETILIDE 125 MCG PO CAPS: ORAL_CAPSULE | ORAL | 3 refills | Status: DC

## 2019-11-04 NOTE — Progress Notes (Signed)
Primary Care Physician: Wenda Low, MD Referring Physician: Dr. Rayann Heman  Cardiologist: Dr. Epifania Gore is a 64 y.o. male with a h/o Graves's disease, s/p RAI ablation, persistent afib since spring of 2020.  He recently saw Dr. Rayann Heman virtually and he suggested starting  xarelto 20 mg daily and planning on cardioversion. CHA2DS2VASc score of 0. He has been on xarelto 20 mg daily since 07/14/19.  He is self employed, no drug insurance.  He does not drink alcohol, no tobacco, some caffeine, pending a sleep study with Dr. Maxwell Caul. Pt does c/o of significant  fatigue since he has been in afib.   Dr. Jackalyn Lombard recommendation was to start flecainide + BB at this visit after 3 weeks on anticoagulation followed by cardioversion (if drug did not convert pt)\  F/u in afib clinic, 09/08/19. Pt had unsuccessful  cardioversion, he did not convert. He is now here to discuss options. The pt and wife really want to try to  get him back in rhythm despite not having insurance. They are willing to pay out of pocket for a tikosyn admit. He is not an ablation candidate at this point and this would be an extremely expensive procedure.  F/u in afib clinic for tikosyn admit, 4/20. He now has insurance and can afford drug thru [facility], if not his new insurance. No benadryl use, no missed anticoagulation. He did stop trazodone many days ago.Off flecainide weeks ago.   F/u tikosyn admit, 11/04/19. He had successful conversion to SR with drug and DCCV. He feels much  Improved.  Qtc is prolonged at 503 ms. He does report that he had gout in his knee and had to have it drained with a shot of cortisone earlier in the week. Much improved.   Today, he denies symptoms of palpitations, chest pain, shortness of breath, orthopnea, PND, lower extremity edema, dizziness, presyncope, syncope, or neurologic sequela. The patient is tolerating medications without difficulties and is otherwise without complaint today.   Past  Medical History:  Diagnosis Date  . AC (acromioclavicular) joint bone spurs   . Allergy   . 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 Arthritis   . Body mass index 31.0-31.9, adult   . Cervical disc disease   . Chronic back pain   . Chronic neck pain   . Dyspnea   . Eosinophilic disorder   . Family history of adverse reaction to anesthesia    mother had nausea  . Graves' disease   . H/O colonoscopy 12/2008  . Hyperlipidemia   . Hyperthyroidism    s/p RAI ablation  . Hypothyroidism   . Insomnia   . Lumbar back pain   . Osteoporosis   . Persistent atrial fibrillation ([place]) 04/12/2019  . Pruritus, unspecified   . 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 CARDIOVERSION N/A 09/01/2019   Procedure: CARDIOVERSION;  Surgeon: Acie Fredrickson Wonda Cheng, MD;  Location: [facility] ENDOSCOPY;  Service: Cardiovascular;  Laterality: N/A;  . CARDIOVERSION N/A 10/27/2019   Procedure: CARDIOVERSION;  Surgeon: Lelon Perla, MD;  Location: [facility] ENDOSCOPY;  Service: Cardiovascular;  Laterality: N/A;  . FISSURECTOMY    . HAND SURGERY    . KNEE ARTHROSCOPY  1999/2007   right knee  . LIPOMA EXCISION     back  . NECK SURGERY    . SHOULDER SURGERY      Current Outpatient Medications  Medication Sig Dispense Refill  .  Cetirizine HCl (ZYRTEC PO) Take 1 tablet by mouth as needed.    . dofetilide (TIKOSYN) 500 MCG capsule Take 1 capsule (500 mcg total) by mouth 2 (two) times daily. 60 capsule 0  . HYDROcodone-acetaminophen (NORCO/VICODIN) 5-325 MG tablet Take 1 tablet by mouth as needed (pain.).     Marland Kitchen levothyroxine (SYNTHROID) 175 MCG tablet Take 175 mcg by mouth daily before breakfast.    . metoprolol succinate (TOPROL XL) 25 MG 24 hr tablet Take 0.5 tablets (12.5 mg total) by mouth at bedtime. 30 tablet 3  . rivaroxaban (XARELTO) 20 MG TABS tablet Take 1 tablet (20 mg total) by mouth daily with supper. 30 tablet 6  . triamcinolone cream (KENALOG) 0.1  % Apply 1 application topically as needed.     . VEMLIDY 25 MG TABS Take 25 mg by mouth daily.     Marland Kitchen zolpidem (AMBIEN) 10 MG tablet Take 10 mg by mouth at bedtime as needed for sleep.      No current facility-administered medications for this encounter.    Allergies  Allergen Reactions  . Bee Venom Swelling    Social History   Socioeconomic History  . Marital status: Married    Spouse name: [name], retail  . Number of children: Not on file  . Years of education: Not on file  . Highest education level: Not on file  Occupational History  . Not on file  Tobacco Use  . Smoking status: Never Smoker  . Smokeless tobacco: Never Used  Substance and Sexual Activity  . Alcohol use: Not Currently    Alcohol/week: 1.0 - 2.0 standard drinks    Types: 1 - 2 Cans of beer per week  . Drug use: No  . Sexual activity: Not Currently  Other Topics Concern  . Not on file  Social History Narrative   Lives in [place] with spouse.   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 Difficulty of Paying Living Expenses:   Food Insecurity:   . Worried About Programme researcher, broadcasting/film/video in the Last Year:   . Barista in the Last Year:   Transportation Needs:   . Freight forwarder (Medical):   Marland Kitchen Lack of Transportation (Non-Medical):   Physical Activity:   . Days of Exercise per Week:   . Minutes of Exercise per Session:   Stress:   . Feeling of Stress :   Social Connections:   . Frequency of Communication with Friends and Family:   . Frequency of Social Gatherings with Friends and Family:   . Attends Religious Services:   . Active Member of Clubs or Organizations:   . Attends Banker Meetings:   Marland Kitchen Marital Status:   Intimate Partner Violence:   . Fear of Current or Ex-Partner:   . Emotionally Abused:   Marland Kitchen Physically Abused:   . Sexually Abused:     Family History  Problem Relation Age of Onset  . Cancer Mother         skin  . Atrial fibrillation Mother     ROS- All systems are reviewed and negative except as per the HPI above  Physical Exam: There were no vitals filed for this visit. Wt Readings from Last 3 Encounters:  10/27/19 90.3 kg  10/25/19 94.2 kg  09/08/19 98.8 kg    Labs: Lab Results  Component Value Date   NA 141 10/28/2019   K 3.7 10/28/2019  CL 107 10/28/2019   CO2 26 10/28/2019   GLUCOSE 106 (H) 10/28/2019   BUN 11 10/28/2019   CREATININE 0.88 10/28/2019   CALCIUM 9.2 10/28/2019   MG 2.0 10/28/2019   Lab Results  Component Value Date   INR 1.7 (H) 10/26/2019   No results found for: CHOL, HDL, LDLCALC, TRIG   GEN- The patient is well appearing, alert and oriented x 3 today.   Head- normocephalic, atraumatic Eyes-  Sclera clear, conjunctiva pink Ears- hearing intact Oropharynx- clear Neck- supple, no JVP Lymph- no cervical lymphadenopathy Lungs- Clear to ausculation bilaterally, normal work of breathing Heart- regular rate and rhythm, no murmurs, rubs or gallops, PMI not laterally displaced GI- soft, NT, ND, + BS Extremities- no clubbing, cyanosis, or edema MS- no significant deformity or atrophy Skin- no rash or lesion Psych- euthymic mood, full affect Neuro- strength and sensation are intact  EKG- sinus brady at 56 bpm, PR int 186 ms, qrs int 84 ms, qtc 503 ms  Echo-IMPRESSIONS    1. Left ventricular ejection fraction, by visual estimation, is 60 to  65%. The left ventricle has normal function. Normal left ventricular size.  There is no left ventricular hypertrophy.  2. Left ventricular diastolic function could not be evaluated pattern of  LV diastolic filling.  3. Global right ventricle has normal systolic function.The right  ventricular size is normal. No increase in right ventricular wall  thickness.  4. Left atrial size was normal.  5. Right atrial size was normal.  6. The mitral valve is normal in structure. Trace mitral valve    regurgitation. No evidence of mitral stenosis.  7. The tricuspid valve is normal in structure. Tricuspid valve  regurgitation is trivial.  8. The aortic valve is tricuspid Aortic valve regurgitation is moderate  by color flow Doppler. Structurally normal aortic valve, with no evidence  of sclerosis or stenosis.  9. The pulmonic valve was grossly normal. Pulmonic valve regurgitation is  trivial by color flow Doppler.  10. Mildly elevated pulmonary artery systolic pressure.  11. The inferior vena cava is dilated in size with <50% respiratory  variability, suggesting right atrial pressure of 15 mmHg.   FINDINGS  Left Ventricle: Left ventricular ejection fraction, by visual estimation,  is 60 to 65%. The left ventricle has normal function. No evidence of left  ventricular regional wall motion abnormalities. There is no left  ventricular hypertrophy. Normal left  ventricular size. Spectral Doppler shows Left ventricular diastolic  function could not be evaluated pattern of LV diastolic filling. Normal left atrial size     Assessment and Plan: 1. Persistent symptomatic  afib since March 2020 Failed flecainide load and cardioversion 09/01/19 Now post  Tikosyn load/ DCCV to SR and feels much improved  qtc at 503 have messaged Dr. Johney Frame and he would like to reduce dose , labs are normal No new meds added since discharge   Will recue to 375 mg bid   2. CHA2DS2VASc score of 0 He is currently taking xarleto 20 mg daily  Will have to continue for at least 4 weeks after cardioversion  I will see back on Monday for EKG on  new tikosyn dose  F/u with Dr. Johney Frame in one month, at 3 months here   Lupita Leash C. [facility] [address] [place], [place] [address] [phone]

## 2019-11-08 ENCOUNTER — Other Ambulatory Visit: Payer: Self-pay

## 2019-11-08 ENCOUNTER — Encounter (HOSPITAL_COMMUNITY): Payer: Self-pay | Admitting: Nurse Practitioner

## 2019-11-08 ENCOUNTER — Ambulatory Visit (HOSPITAL_COMMUNITY)
Admission: RE | Admit: 2019-11-08 | Discharge: 2019-11-08 | Disposition: A | Discharge status: Home / Self Care | Payer: 59 | Source: Ambulatory Visit | Attending: Nurse Practitioner | Admitting: Nurse Practitioner

## 2019-11-08 VITALS — BP 120/72 | HR 56

## 2019-11-08 DIAGNOSIS — I4891 Unspecified atrial fibrillation: Secondary | ICD-10-CM | POA: Insufficient documentation

## 2019-11-08 DIAGNOSIS — Z79899 Other long term (current) drug therapy: Secondary | ICD-10-CM | POA: Insufficient documentation

## 2019-11-08 DIAGNOSIS — I4819 Other persistent atrial fibrillation: Secondary | ICD-10-CM

## 2019-11-08 NOTE — Progress Notes (Signed)
Pt is here for f/u tikosyn admit and long qtc of 502 ms noted at first week f/u. The dose of Tikosyn was reduced to 375 mg bid and his qts now is 451 ms. Otherwise in sinus brady at 56 bpm, pr int 184 ms, qrs int 82 ms. F/u with Dr. Johney Frame 5/26.

## 2019-11-30 ENCOUNTER — Ambulatory Visit (INDEPENDENT_AMBULATORY_CARE_PROVIDER_SITE_OTHER): Payer: 59 | Admitting: Internal Medicine

## 2019-11-30 ENCOUNTER — Encounter: Payer: Self-pay | Admitting: Internal Medicine

## 2019-11-30 ENCOUNTER — Other Ambulatory Visit: Payer: Self-pay

## 2019-11-30 VITALS — BP 124/82 | HR 63 | Ht 72.0 in | Wt 211.0 lb

## 2019-11-30 DIAGNOSIS — I4819 Other persistent atrial fibrillation: Secondary | ICD-10-CM | POA: Diagnosis not present

## 2019-11-30 DIAGNOSIS — D6869 Other thrombophilia: Secondary | ICD-10-CM

## 2019-11-30 MED ORDER — METOPROLOL SUCCINATE ER 25 MG PO TB24: 12.5000 mg | ORAL_TABLET | ORAL | 3 refills | Status: DC | PRN

## 2019-11-30 NOTE — Patient Instructions (Addendum)
Medication Instructions:  Your physician has recommended you make the following change in your medication:   1.  CHANGE your TOPROL XL 25 mg-  Take 1/2 tablet AS NEEDED for palpitations.   Labwork: None ordered.  Testing/Procedures: None ordered.  Follow-Up: Your physician wants you to follow-up in: 3 months with Dr. Johney Frame.     March 02, 2020 at 8;00 am at the [facility] office  Any Other Special Instructions Will Be Listed Below (If Applicable).  If you need a refill on your cardiac medications before your next appointment, please call your pharmacy.

## 2019-11-30 NOTE — Progress Notes (Signed)
PCP: Georgann Housekeeper, MD   Primary EP: Dr Johney Frame  Joshua Lowe is a 64 y.o. male who presents today for routine electrophysiology followup.  Since starting tikosyn, the patient reports doing very well.  He feels much better in sinus.  Today, he denies symptoms of palpitations, chest pain, shortness of breath,  lower extremity edema, dizziness, presyncope, or syncope.  The patient is otherwise without complaint today.   Past Medical History:  Diagnosis Date  . AC (acromioclavicular) joint bone spurs   . Allergy   . 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 Arthritis   . Body mass index 31.0-31.9, adult   . Cervical disc disease   . Chronic back pain   . Chronic neck pain   . Dyspnea   . Eosinophilic disorder   . Family history of adverse reaction to anesthesia    mother had nausea  . Graves' disease   . H/O colonoscopy 12/2008  . Hyperlipidemia   . Hyperthyroidism    s/p RAI ablation  . Hypothyroidism   . Insomnia   . Lumbar back pain   . Osteoporosis   . Persistent atrial fibrillation ([facility]) 04/12/2019  . Pruritus, unspecified   . 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 CARDIOVERSION N/A 09/01/2019   Procedure: CARDIOVERSION;  Surgeon: [name] Deloris Ping, MD;  Location: [facility] ENDOSCOPY;  Service: Cardiovascular;  Laterality: N/A;  . CARDIOVERSION N/A 10/27/2019   Procedure: CARDIOVERSION;  Surgeon: Lewayne Bunting, MD;  Location: [facility] ENDOSCOPY;  Service: Cardiovascular;  Laterality: N/A;  . FISSURECTOMY    . HAND SURGERY    . KNEE ARTHROSCOPY  1999/2007   right knee  . LIPOMA EXCISION     back  . NECK SURGERY    . SHOULDER SURGERY      ROS- all systems are reviewed and negatives except as per HPI above  Current Outpatient Medications  Medication Sig Dispense Refill  . Cetirizine HCl (ZYRTEC PO) Take 1 tablet by mouth as needed.    . dofetilide (TIKOSYN) 125 MCG capsule Take 1 tablet by mouth twice a day in  addition to twice a day to equal BID 60 capsule 3  . dofetilide (TIKOSYN) 250 MCG capsule Take 1 tablet by mouth twice a day in addition to twice a day to equal BID 60 capsule 3  . HYDROcodone-acetaminophen (NORCO/VICODIN) 5-325 MG tablet Take 1 tablet by mouth as needed (pain.).     Marland Kitchen levothyroxine (SYNTHROID) 175 MCG tablet Take 175 mcg by mouth daily before breakfast.    . metoprolol succinate (TOPROL XL) 25 MG 24 hr tablet Take 0.5 tablets (12.5 mg total) by mouth at bedtime. 30 tablet 3  . rivaroxaban (XARELTO) 20 MG TABS tablet Take 1 tablet (20 mg total) by mouth daily with supper. 30 tablet 6  . triamcinolone cream (KENALOG) 0.1 % Apply 1 application topically as needed.     . VEMLIDY 25 MG TABS Take 25 mg by mouth daily.     Marland Kitchen zolpidem (AMBIEN) 10 MG tablet Take 10 mg by mouth at bedtime as needed for sleep.      No current facility-administered medications for this visit.    Physical Exam: Vitals:   11/30/19 1244  BP: 124/82  Pulse: 63  SpO2: 93%  Weight: 211 lb (95.7 kg)  Height: 6' (1.829 m)    GEN- The patient is well appearing, alert and oriented x  3 today.   Head- normocephalic, atraumatic Eyes-  Sclera clear, conjunctiva pink Ears- hearing intact Oropharynx- clear Lungs- Clear to ausculation bilaterally, normal work of breathing Heart- Regular rate and rhythm, no murmurs, rubs or gallops, PMI not laterally displaced GI- soft, NT, ND, + BS Extremities- no clubbing, cyanosis, or edema  Wt Readings from Last 3 Encounters:  11/30/19 211 lb (95.7 kg)  11/04/19 207 lb (93.9 kg)  10/27/19 199 lb (90.3 kg)    EKG tracing ordered today is personally reviewed and shows sinus rhythm 63 bpm, PR 194, QRS 450 msec.  Assessment and Plan:  1. Persistent atrial fibrillation Doing very well with tikosyn Qt is stable No changes today chads2vas score is 0.  We will likely stop xarelto on return Wean metoprolol to off  Risks, benefits and  potential toxicities for medications prescribed and/or refilled reviewed with patient today.   Return in 3 months to see me  Thompson Grayer MD, [facility] 11/30/2019 1:12 PM

## 2020-02-02 ENCOUNTER — Ambulatory Visit (HOSPITAL_COMMUNITY): Payer: 59 | Admitting: Nurse Practitioner

## 2020-03-02 ENCOUNTER — Other Ambulatory Visit: Payer: Self-pay

## 2020-03-02 ENCOUNTER — Ambulatory Visit (INDEPENDENT_AMBULATORY_CARE_PROVIDER_SITE_OTHER): Payer: 59 | Admitting: Internal Medicine

## 2020-03-02 ENCOUNTER — Encounter: Payer: Self-pay | Admitting: Internal Medicine

## 2020-03-02 VITALS — BP 122/72 | HR 59 | Ht 72.0 in | Wt 214.0 lb

## 2020-03-02 DIAGNOSIS — I4819 Other persistent atrial fibrillation: Secondary | ICD-10-CM

## 2020-03-02 NOTE — Patient Instructions (Addendum)
Medication Instructions:  Your physician has recommended you make the following change in your medication:   1.  STOP taking Xarelto  *If you need a refill on your cardiac medications before your next appointment, please call your pharmacy*  Lab Work: You will get lab work today:  BMP and magnesium  If you have labs (blood work) drawn today and your tests are completely normal, you will receive your results only by: Marland Kitchen MyChart Message (if you have MyChart) OR . A paper copy in the mail If you have any lab test that is abnormal or we need to change your treatment, we will call you to review the results.  Testing/Procedures: None ordered.  Follow-Up: At [facility], you and your health needs are our priority.  As part of our continuing mission to provide you with exceptional heart care, we have created designated Provider Care Teams.  These Care Teams include your primary Cardiologist (physician) and Advanced Practice Providers (APPs -  Physician Assistants and Nurse Practitioners) who all work together to provide you with the care you need, when you need it.  We recommend signing up for the patient portal called "MyChart".  Sign up information is provided on this After Visit Summary.  MyChart is used to connect with patients for Virtual Visits (Telemedicine).  Patients are able to view lab/test results, encounter notes, upcoming appointments, etc.  Non-urgent messages can be sent to your provider as well.   To learn more about what you can do with MyChart, go to [email or web address].    Your next appointment:   Your physician wants you to follow-up in: 4 months with Dr. Johney Frame.  June 25, 2020 at 11:30 am at the [facility] office

## 2020-03-02 NOTE — Progress Notes (Signed)
PCP: Georgann Housekeeper, MD   Primary EP: Dr Johney Frame  Joshua Lowe is a 64 y.o. male who presents today for routine electrophysiology followup.  Since last being seen in our clinic, the patient reports doing very well.  He is not having afib.  His primary concern is with fatigue.  Today, he denies symptoms of palpitations, chest pain, shortness of breath,  lower extremity edema, dizziness, presyncope, or syncope.  The patient is otherwise without complaint today.   Past Medical History:  Diagnosis Date  . AC (acromioclavicular) joint bone spurs   . Allergy   . 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 Arthritis   . Body mass index 31.0-31.9, adult   . Cervical disc disease   . Chronic back pain   . Chronic neck pain   . Dyspnea   . Eosinophilic disorder   . Family history of adverse reaction to anesthesia    mother had nausea  . Graves' disease   . H/O colonoscopy 12/2008  . Hyperlipidemia   . Hyperthyroidism    s/p RAI ablation  . Hypothyroidism   . Insomnia   . Lumbar back pain   . Osteoporosis   . Persistent atrial fibrillation ([facility]) 04/12/2019  . Pruritus, unspecified   . 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 CARDIOVERSION N/A 09/01/2019   Procedure: CARDIOVERSION;  Surgeon: [name] Deloris Ping, MD;  Location: [facility] ENDOSCOPY;  Service: Cardiovascular;  Laterality: N/A;  . CARDIOVERSION N/A 10/27/2019   Procedure: CARDIOVERSION;  Surgeon: Lewayne Bunting, MD;  Location: [facility] ENDOSCOPY;  Service: Cardiovascular;  Laterality: N/A;  . FISSURECTOMY    . HAND SURGERY    . KNEE ARTHROSCOPY  1999/2007   right knee  . LIPOMA EXCISION     back  . NECK SURGERY    . SHOULDER SURGERY      ROS- all systems are reviewed and negatives except as per HPI above  Current Outpatient Medications  Medication Sig Dispense Refill  . Cetirizine HCl (ZYRTEC PO) Take 1 tablet by mouth as needed.    . dofetilide (TIKOSYN) 125 MCG  capsule Take 1 tablet by mouth twice a day in addition to twice a day to equal BID 60 capsule 3  . dofetilide (TIKOSYN) 250 MCG capsule Take 1 tablet by mouth twice a day in addition to twice a day to equal BID 60 capsule 3  . HYDROcodone-acetaminophen (NORCO/VICODIN) 5-325 MG tablet Take 1 tablet by mouth as needed (pain.).     Marland Kitchen levothyroxine (SYNTHROID) 175 MCG tablet Take 175 mcg by mouth daily before breakfast.    . metoprolol succinate (TOPROL XL) 25 MG 24 hr tablet Take 0.5 tablets (12.5 mg total) by mouth as needed (palpitations). 90 tablet 3  . rivaroxaban (XARELTO) 20 MG TABS tablet Take 1 tablet (20 mg total) by mouth daily with supper. 30 tablet 6  . VEMLIDY 25 MG TABS Take 25 mg by mouth daily.     Marland Kitchen zolpidem (AMBIEN) 10 MG tablet Take 10 mg by mouth at bedtime as needed for sleep.      No current facility-administered medications for this visit.    Physical Exam: Vitals:   03/02/20 1231  BP: 122/72  Pulse: (!) 59  SpO2: 94%  Weight: 214 lb (97.1 kg)  Height: 6' (1.829 m)    GEN- The patient is well appearing, alert and oriented x 3 today.  Head- normocephalic, atraumatic Eyes-  Sclera clear, conjunctiva pink Ears- hearing intact Oropharynx- clear Lungs- Clear to ausculation bilaterally, normal work of breathing Heart- Regular rate and rhythm, no murmurs, rubs or gallops, PMI not laterally displaced GI- soft, NT, ND, + BS Extremities- no clubbing, cyanosis, or edema  Wt Readings from Last 3 Encounters:  03/02/20 214 lb (97.1 kg)  11/30/19 211 lb (95.7 kg)  11/04/19 207 lb (93.9 kg)    EKG tracing ordered today is personally reviewed and shows sinus  Assessment and Plan:  1. Persistent afib Doing very well with tikosyn We discussed importance of close monitoring on this medicine to avoid toxicity chads2vasc score is 0.  stop xarelto Qt is stable Repeat bmet, mg today  Risks, benefits and potential toxicities for medications  prescribed and/or refilled reviewed with patient today.   Follow-up with me in 4 months  Hillis Range MD, [facility] 03/02/2020 1:03 PM

## 2020-03-03 LAB — BASIC METABOLIC PANEL
BUN/Creatinine Ratio: 18 (ref 10–24)
BUN: 14 mg/dL (ref 8–27)
CO2: 25 mmol/L (ref 20–29)
Calcium: 9.8 mg/dL (ref 8.6–10.2)
Chloride: 103 mmol/L (ref 96–106)
Creatinine, Ser: 0.8 mg/dL (ref 0.76–1.27)
GFR calc Af Amer: 111 mL/min/{1.73_m2} (ref >59)
GFR calc non Af Amer: 96 mL/min/{1.73_m2} (ref >59)
Glucose: 91 mg/dL (ref 65–99)
Potassium: 4.5 mmol/L (ref 3.5–5.2)
Sodium: 142 mmol/L (ref 134–144)

## 2020-03-03 LAB — MAGNESIUM: Magnesium: 2.2 mg/dL (ref 1.6–2.3)

## 2020-03-09 ENCOUNTER — Telehealth: Payer: Self-pay | Admitting: Internal Medicine

## 2020-03-09 NOTE — Telephone Encounter (Signed)
Phone just rings N/A and no voice mail Will try later .Zack Seal

## 2020-03-09 NOTE — Telephone Encounter (Signed)
Lm to call back ./cy 

## 2020-03-09 NOTE — Telephone Encounter (Signed)
    Pt's wife got notification on mychart about results, she wants to speak with a nurse to explain the result to them

## 2020-03-13 ENCOUNTER — Other Ambulatory Visit: Payer: Self-pay | Admitting: Nurse Practitioner

## 2020-03-13 DIAGNOSIS — B181 Chronic viral hepatitis B without delta-agent: Secondary | ICD-10-CM

## 2020-03-22 ENCOUNTER — Ambulatory Visit
Admission: RE | Admit: 2020-03-22 | Discharge: 2020-03-22 | Disposition: A | Discharge status: Home / Self Care | Payer: 59 | Source: Ambulatory Visit | Attending: Nurse Practitioner | Admitting: Nurse Practitioner

## 2020-03-22 DIAGNOSIS — B181 Chronic viral hepatitis B without delta-agent: Secondary | ICD-10-CM

## 2020-04-19 ENCOUNTER — Other Ambulatory Visit: Payer: Self-pay | Admitting: Gastroenterology

## 2020-04-19 DIAGNOSIS — R1310 Dysphagia, unspecified: Secondary | ICD-10-CM

## 2020-04-24 ENCOUNTER — Ambulatory Visit
Admission: RE | Admit: 2020-04-24 | Discharge: 2020-04-24 | Disposition: A | Discharge status: Home / Self Care | Payer: 59 | Source: Ambulatory Visit | Attending: Gastroenterology | Admitting: Gastroenterology

## 2020-04-24 DIAGNOSIS — R1310 Dysphagia, unspecified: Secondary | ICD-10-CM

## 2020-06-04 ENCOUNTER — Other Ambulatory Visit: Payer: Self-pay | Admitting: Gastroenterology

## 2020-06-12 ENCOUNTER — Other Ambulatory Visit (HOSPITAL_COMMUNITY): Payer: 59

## 2020-06-15 ENCOUNTER — Encounter (HOSPITAL_COMMUNITY): Admission: RE | Payer: Self-pay | Source: Home / Self Care

## 2020-06-15 ENCOUNTER — Ambulatory Visit (HOSPITAL_COMMUNITY): Admission: RE | Admit: 2020-06-15 | Payer: 59 | Source: Home / Self Care | Admitting: Gastroenterology

## 2020-06-15 SURGERY — MANOMETRY, ESOPHAGUS

## 2020-06-18 ENCOUNTER — Other Ambulatory Visit (HOSPITAL_COMMUNITY): Payer: Self-pay | Admitting: Nurse Practitioner

## 2020-06-25 ENCOUNTER — Encounter: Payer: Self-pay | Admitting: *Deleted

## 2020-06-25 ENCOUNTER — Encounter: Payer: Self-pay | Admitting: Internal Medicine

## 2020-06-25 ENCOUNTER — Other Ambulatory Visit: Payer: Self-pay

## 2020-06-25 ENCOUNTER — Ambulatory Visit (INDEPENDENT_AMBULATORY_CARE_PROVIDER_SITE_OTHER): Payer: 59 | Admitting: Internal Medicine

## 2020-06-25 VITALS — BP 112/64 | HR 58 | Ht 72.0 in | Wt 217.0 lb

## 2020-06-25 DIAGNOSIS — R0602 Shortness of breath: Secondary | ICD-10-CM | POA: Diagnosis not present

## 2020-06-25 DIAGNOSIS — I4819 Other persistent atrial fibrillation: Secondary | ICD-10-CM

## 2020-06-25 DIAGNOSIS — I4891 Unspecified atrial fibrillation: Secondary | ICD-10-CM | POA: Diagnosis not present

## 2020-06-25 NOTE — Progress Notes (Signed)
PCP: Georgann Housekeeper, MD   Primary EP: Dr Johney Frame  Joshua Lowe is a 64 y.o. male who presents today for routine electrophysiology followup.  Since last being seen in our clinic, the patient reports doing very well.  He is accompanied by his wife.  Their concern is with exercise fatigue/ SOB.  With moderate activity, he becomes extremely SOB and fatigued and has to stop.  After a few minutes, he recovers and can resume activity.  Today, he denies symptoms of palpitations, chest pain, shortness of breath,  lower extremity edema, dizziness, presyncope, or syncope.  The patient is otherwise without complaint today.   Past Medical History:  Diagnosis Date  . AC (acromioclavicular) joint bone spurs   . Allergy   . 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 Arthritis   . Body mass index 31.0-31.9, adult   . Cervical disc disease   . Chronic back pain   . Chronic neck pain   . Dyspnea   . Eosinophilic disorder   . Family history of adverse reaction to anesthesia    mother had nausea  . Graves' disease   . H/O colonoscopy 12/2008  . Hyperlipidemia   . Hyperthyroidism    s/p RAI ablation  . Hypothyroidism   . Insomnia   . Lumbar back pain   . Osteoporosis   . Persistent atrial fibrillation (HCC) 04/12/2019  . Pruritus, unspecified   . 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 CARDIOVERSION N/A 09/01/2019   Procedure: CARDIOVERSION;  Surgeon: [name] Deloris Ping, MD;  Location: [facility] ENDOSCOPY;  Service: Cardiovascular;  Laterality: N/A;  . CARDIOVERSION N/A 10/27/2019   Procedure: CARDIOVERSION;  Surgeon: Lewayne Bunting, MD;  Location: [facility] ENDOSCOPY;  Service: Cardiovascular;  Laterality: N/A;  . FISSURECTOMY    . HAND SURGERY    . KNEE ARTHROSCOPY  1999/2007   right knee  . LIPOMA EXCISION     back  . NECK SURGERY    . SHOULDER SURGERY      ROS- all systems are reviewed and negatives except as per HPI above  Current  Outpatient Medications  Medication Sig Dispense Refill  . cetirizine (ZYRTEC) 10 MG tablet Take 10 mg by mouth daily.    . Cetirizine HCl (ZYRTEC PO) Take 1 tablet by mouth as needed.    . dofetilide (TIKOSYN) 125 MCG capsule TAKE ONE CAPSULE BY MOUTH TWICE A DAY WITH 250MG  CAPSULE 60 capsule 3  . dofetilide (TIKOSYN) 250 MCG capsule TAKE ONE CAPSULE BY MOUTH TWICE A DAY WITH 125MG  CAPSULE 60 capsule 3  . EPINEPHrine 0.3 mg/0.3 mL IJ SOAJ injection Inject 1 mL into the skin as needed for allergies.    HYDROcodone-acetaminophen (NORCO/VICODIN) 5-325 MG tablet Take 1 tablet by mouth as needed (pain.).     levothyroxine (SYNTHROID) 175 MCG tablet Take 175 mcg by mouth daily before breakfast.    . loteprednol (LOTEMAX) 0.5 % ophthalmic suspension Place 1 drop into both eyes as needed for irritation.    . VEMLIDY 25 MG TABS Take 25 mg by mouth daily.     Marland Kitchen zolpidem (AMBIEN) 10 MG tablet Take 10 mg by mouth at bedtime as needed for sleep.      No current facility-administered medications for this visit.    Physical Exam: Vitals:   06/25/20 1129  BP: 112/64  Pulse: (!) 58  SpO2: 98%  Weight: 217 lb (98.4 kg)  Height:  6' (1.829 m)    GEN- The patient is well appearing, alert and oriented x 3 today.   Head- normocephalic, atraumatic Eyes-  Sclera clear, conjunctiva pink Ears- hearing intact Oropharynx- clear Lungs- Clear to ausculation bilaterally, normal work of breathing Heart- Regular rate and rhythm, no murmurs, rubs or gallops, PMI not laterally displaced GI- soft, NT, ND, + BS Extremities- no clubbing, cyanosis, or edema  Wt Readings from Last 3 Encounters:  06/25/20 217 lb (98.4 kg)  03/02/20 214 lb (97.1 kg)  11/30/19 211 lb (95.7 kg)    EKG tracing ordered today is personally reviewed and shows sinus rhythm 58 bpm, PR 190 msec, QTc 424 msec  Assessment and Plan:  1. Persistent afib Well controlled with Joice Lofts We discussed importance of close monitoring to avoid  toxicity He does not require OAC therapy (Chads2vasc score is 0) Labs 04/17/20 from PCP are reviewed  2. Exercise SOB/ fatigue Cardiac CT with FFR to evaluate for ischemic cause If normal, consider CPX (metabolic stress testing)  to further evaluate for other causes of exertional symptoms  Risks, benefits and potential toxicities for medications prescribed and/or refilled reviewed with patient today.  Return in 6 weeks to see EP PA I will see again in 6 months  Hillis Range MD, [facility] 06/25/2020 12:19 PM

## 2020-06-25 NOTE — Patient Instructions (Addendum)
Medication Instructions:  Your physician recommends that you continue on your current medications as directed. Please refer to the Current Medication list given to you today.  *If you need a refill on your cardiac medications before your next appointment, please call your pharmacy*  Lab Work: BMP.  If you have labs (blood work) drawn today and your tests are completely normal, you will receive your results only by: Marland Kitchen MyChart Message (if you have MyChart) OR . A paper copy in the mail If you have any lab test that is abnormal or we need to change your treatment, we will call you to review the results.  Testing/Procedures: Schedule Cardiac CT Your physician has requested that you have cardiac CT. Cardiac computed tomography (CT) is a painless test that uses an x-ray machine to take clear, detailed pictures of your heart. For further information please visit [email or web address] Please follow instruction sheet as given.    Follow-Up: At [facility], you and your health needs are our priority.  As part of our continuing mission to provide you with exceptional heart care, we have created designated Provider Care Teams.  These Care Teams include your primary Cardiologist (physician) and Advanced Practice Providers (APPs -  Physician Assistants and Nurse Practitioners) who all work together to provide you with the care you need, when you need it.  We recommend signing up for the patient portal called "MyChart".  Sign up information is provided on this After Visit Summary.  MyChart is used to connect with patients for Virtual Visits (Telemedicine).  Patients are able to view lab/test results, encounter notes, upcoming appointments, etc.  Non-urgent messages can be sent to your provider as well.   To learn more about what you can do with MyChart, go to [email or web address].    Your next appointment:   Your physician wants you to follow-up in:  6 weeks with Joshua Lowe.  Other  Instructions:

## 2020-06-26 LAB — BASIC METABOLIC PANEL
BUN/Creatinine Ratio: 10 (ref 10–24)
BUN: 8 mg/dL (ref 8–27)
CO2: 26 mmol/L (ref 20–29)
Calcium: 9.7 mg/dL (ref 8.6–10.2)
Chloride: 103 mmol/L (ref 96–106)
Creatinine, Ser: 0.79 mg/dL (ref 0.76–1.27)
GFR calc Af Amer: 111 mL/min/{1.73_m2} (ref >59)
GFR calc non Af Amer: 96 mL/min/{1.73_m2} (ref >59)
Glucose: 82 mg/dL (ref 65–99)
Potassium: 4.6 mmol/L (ref 3.5–5.2)
Sodium: 140 mmol/L (ref 134–144)

## 2020-07-10 ENCOUNTER — Telehealth: Payer: Self-pay | Admitting: Internal Medicine

## 2020-07-10 NOTE — Telephone Encounter (Signed)
Cardiac CT ordered 06/25/20 still needs scheduling. Patients wife is calling in to give new insurance information.   Will forward to scheduling pool and precert pool for assistance.

## 2020-07-10 NOTE — Telephone Encounter (Signed)
Patient's wife calling to speak with Dr. Jenel Lucks nurse in regards to his Cardiac CT.

## 2020-07-16 NOTE — Addendum Note (Signed)
Addended by: Sampson Goon on: 07/16/2020 01:55 PM   Modules accepted: Orders

## 2020-07-25 ENCOUNTER — Telehealth (HOSPITAL_COMMUNITY): Payer: Self-pay | Admitting: *Deleted

## 2020-07-25 NOTE — Telephone Encounter (Signed)
Attempted to call patient regarding upcoming cardiac CT appointment. Left message on voicemail with name and callback number  Daphanie Oquendo RN Navigator Cardiac Imaging [facility] [phone] Office [phone] Cell  

## 2020-07-26 ENCOUNTER — Other Ambulatory Visit: Payer: Self-pay

## 2020-07-26 ENCOUNTER — Ambulatory Visit (HOSPITAL_COMMUNITY)
Admission: RE | Admit: 2020-07-26 | Discharge: 2020-07-26 | Disposition: A | Discharge status: Home / Self Care | Payer: BC Managed Care – PPO | Source: Ambulatory Visit | Attending: Internal Medicine | Admitting: Internal Medicine

## 2020-07-26 ENCOUNTER — Encounter (HOSPITAL_COMMUNITY): Payer: Self-pay

## 2020-07-26 DIAGNOSIS — R0602 Shortness of breath: Secondary | ICD-10-CM | POA: Diagnosis not present

## 2020-07-26 DIAGNOSIS — I4891 Unspecified atrial fibrillation: Secondary | ICD-10-CM

## 2020-07-26 DIAGNOSIS — I4819 Other persistent atrial fibrillation: Secondary | ICD-10-CM

## 2020-07-26 MED ORDER — METOPROLOL TARTRATE 5 MG/5ML IV SOLN: 5.0000 mg | INTRAVENOUS | Status: DC | PRN

## 2020-07-26 MED ORDER — NITROGLYCERIN 0.4 MG SL SUBL
SUBLINGUAL_TABLET | SUBLINGUAL | Status: AC
  Filled 2020-07-26: qty 2

## 2020-07-26 MED ORDER — IOHEXOL 350 MG/ML SOLN
80.0000 mL | Freq: Once | INTRAVENOUS | Status: AC | PRN
  Administered 2020-07-26: 80 mL via INTRAVENOUS

## 2020-07-26 MED ORDER — NITROGLYCERIN 0.4 MG SL SUBL: 0.8000 mg | SUBLINGUAL_TABLET | Freq: Once | SUBLINGUAL | Status: DC

## 2020-07-26 MED ORDER — METOPROLOL TARTRATE 5 MG/5ML IV SOLN
INTRAVENOUS | Status: AC
  Filled 2020-07-26: qty 10

## 2020-08-14 ENCOUNTER — Ambulatory Visit: Payer: BC Managed Care – PPO | Admitting: Physician Assistant

## 2020-08-30 DIAGNOSIS — G4733 Obstructive sleep apnea (adult) (pediatric): Secondary | ICD-10-CM | POA: Diagnosis not present

## 2020-09-03 DIAGNOSIS — K219 Gastro-esophageal reflux disease without esophagitis: Secondary | ICD-10-CM | POA: Diagnosis not present

## 2020-09-03 DIAGNOSIS — E039 Hypothyroidism, unspecified: Secondary | ICD-10-CM | POA: Diagnosis not present

## 2020-09-03 DIAGNOSIS — G8929 Other chronic pain: Secondary | ICD-10-CM | POA: Diagnosis not present

## 2020-09-03 DIAGNOSIS — M549 Dorsalgia, unspecified: Secondary | ICD-10-CM | POA: Diagnosis not present

## 2020-09-11 DIAGNOSIS — B181 Chronic viral hepatitis B without delta-agent: Secondary | ICD-10-CM | POA: Diagnosis not present

## 2020-09-12 ENCOUNTER — Other Ambulatory Visit: Payer: Self-pay | Admitting: Nurse Practitioner

## 2020-09-12 DIAGNOSIS — B181 Chronic viral hepatitis B without delta-agent: Secondary | ICD-10-CM

## 2020-09-27 ENCOUNTER — Ambulatory Visit
Admission: RE | Admit: 2020-09-27 | Discharge: 2020-09-27 | Disposition: A | Discharge status: Home / Self Care | Payer: BC Managed Care – PPO | Source: Ambulatory Visit | Attending: Nurse Practitioner | Admitting: Nurse Practitioner

## 2020-09-27 DIAGNOSIS — K760 Fatty (change of) liver, not elsewhere classified: Secondary | ICD-10-CM | POA: Diagnosis not present

## 2020-09-27 DIAGNOSIS — B181 Chronic viral hepatitis B without delta-agent: Secondary | ICD-10-CM

## 2020-10-10 ENCOUNTER — Other Ambulatory Visit (HOSPITAL_COMMUNITY): Payer: Self-pay | Admitting: Nurse Practitioner

## 2020-12-26 ENCOUNTER — Encounter: Payer: Self-pay | Admitting: Internal Medicine

## 2020-12-26 ENCOUNTER — Other Ambulatory Visit: Payer: Self-pay

## 2020-12-26 ENCOUNTER — Ambulatory Visit (INDEPENDENT_AMBULATORY_CARE_PROVIDER_SITE_OTHER): Payer: BC Managed Care – PPO | Admitting: Internal Medicine

## 2020-12-26 VITALS — BP 126/78 | HR 66 | Ht 72.0 in | Wt 214.0 lb

## 2020-12-26 DIAGNOSIS — I4819 Other persistent atrial fibrillation: Secondary | ICD-10-CM

## 2020-12-26 NOTE — Patient Instructions (Addendum)
Medication Instructions:  Your physician recommends that you continue on your current medications as directed. Please refer to the Current Medication list given to you today.  Labwork: BMP, MAG  Testing/Procedures: None ordered.  Follow-Up: Your physician wants you to follow-up in: 6 months in the AF Clinic. They will contact you to schedule.    Any Other Special Instructions Will Be Listed Below (If Applicable).  If you need a refill on your cardiac medications before your next appointment, please call your pharmacy.

## 2020-12-26 NOTE — Progress Notes (Signed)
PCP: Georgann Housekeeper, MD   Primary EP: Dr Johney Frame  Joshua Lowe is a 65 y.o. male who presents today for routine electrophysiology followup.  Since last being seen in our clinic, the patient reports doing very well.  Today, he denies symptoms of palpitations, chest pain, shortness of breath,  lower extremity edema, dizziness, presyncope, or syncope.  The patient is otherwise without complaint today.   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   Arthritis    Body mass index 31.0-31.9, adult    Cervical disc disease    Chronic back pain    Chronic neck pain    Dyspnea    Eosinophilic disorder    Family history of adverse reaction to anesthesia    mother had nausea   Graves' disease    H/O colonoscopy 12/2008   Hyperlipidemia    Hyperthyroidism    s/p RAI ablation   Hypothyroidism    Insomnia    Lumbar back pain    Osteoporosis    Persistent atrial fibrillation (HCC) 04/12/2019   Pruritus, unspecified    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    Past Surgical History:  Procedure Laterality Date   CARDIOVERSION N/A 09/01/2019   Procedure: CARDIOVERSION;  Surgeon: Nahser, Deloris Ping, MD;  Location: [facility] ENDOSCOPY;  Service: Cardiovascular;  Laterality: N/A;   CARDIOVERSION N/A 10/27/2019   Procedure: CARDIOVERSION;  Surgeon: Lewayne Bunting, MD;  Location: [facility] ENDOSCOPY;  Service: Cardiovascular;  Laterality: N/A;   FISSURECTOMY     HAND SURGERY     KNEE ARTHROSCOPY  1999/2007   right knee   LIPOMA EXCISION     back   NECK SURGERY     SHOULDER SURGERY      ROS- all systems are reviewed and negatives except as per HPI above  Current Outpatient Medications  Medication Sig Dispense Refill   cetirizine (ZYRTEC) 10 MG tablet Take 10 mg by mouth daily.     colchicine 0.6 MG tablet Take 1 tablet by mouth as needed for irritation.     dofetilide (TIKOSYN) 250 MCG capsule TAKE ONE  CAPSULE BY MOUTH TWICE A DAY WITH CAPSULE 60 capsule 5   EPINEPHrine 0.3 mg/0.3 mL IJ SOAJ injection Inject 1 mL into the skin as needed for allergies.     levothyroxine (SYNTHROID) 175 MCG tablet Take 175 mcg by mouth daily before breakfast.     loteprednol (LOTEMAX) 0.5 % ophthalmic suspension Place 1 drop into both eyes as needed for irritation.     Oxycodone HCl 10 MG TABS Take 1 tablet by mouth as needed for pain.     VEMLIDY 25 MG TABS Take 25 mg by mouth daily.      zolpidem (AMBIEN) 10 MG tablet Take 10 mg by mouth at bedtime as needed for sleep.      No current facility-administered medications for this visit.    Physical Exam: Vitals:   12/26/20 1534  BP: 126/78  Pulse: 66  SpO2: 96%  Weight: 214 lb (97.1 kg)  Height: 6' (1.829 m)    GEN- The patient is well appearing, alert and oriented x 3 today.   Head- normocephalic, atraumatic Eyes-  Sclera clear, conjunctiva pink Ears- hearing intact Oropharynx- clear Lungs- Clear to ausculation bilaterally, normal work of breathing Heart- Regular rate and rhythm, no murmurs, rubs or gallops, PMI not laterally displaced GI- soft, NT,  ND, + BS Extremities- no clubbing, cyanosis, or edema  Wt Readings from Last 3 Encounters:  12/26/20 214 lb (97.1 kg)  06/25/20 217 lb (98.4 kg)  03/02/20 214 lb (97.1 kg)    EKG tracing ordered today is personally reviewed and shows sinus, qtc 427 msec  Assessment and Plan:  Persistent atrial fibrillation Well controlled with tikosyn Chads2vas score is 0.  Does not require OAC therapy We will follow him closely on tikosyn to avoid toxicity Labs 12/21 reviewed Bmet, mg ordered today  2. SOB improved Cardiac CT 1/22 reviewed and showed no CAD. Regular exercise encouraged  Risks, benefits and potential toxicities for medications prescribed and/or refilled reviewed with patient today.   Follow-up in AF clinic every 6 months I will see when needed  Hillis Range MD,  [facility] 12/26/2020 3:55 PM

## 2020-12-27 LAB — BASIC METABOLIC PANEL
BUN/Creatinine Ratio: 12 (ref 10–24)
BUN: 12 mg/dL (ref 8–27)
CO2: 24 mmol/L (ref 20–29)
Calcium: 9.9 mg/dL (ref 8.6–10.2)
Chloride: 106 mmol/L (ref 96–106)
Creatinine, Ser: 1.01 mg/dL (ref 0.76–1.27)
Glucose: 91 mg/dL (ref 65–99)
Potassium: 5.1 mmol/L (ref 3.5–5.2)
Sodium: 145 mmol/L — ABNORMAL HIGH (ref 134–144)
eGFR: 84 mL/min/{1.73_m2} (ref >59)

## 2020-12-27 LAB — MAGNESIUM: Magnesium: 2.3 mg/dL (ref 1.6–2.3)

## 2021-03-18 DIAGNOSIS — K760 Fatty (change of) liver, not elsewhere classified: Secondary | ICD-10-CM | POA: Diagnosis not present

## 2021-03-18 DIAGNOSIS — B181 Chronic viral hepatitis B without delta-agent: Secondary | ICD-10-CM | POA: Diagnosis not present

## 2021-03-19 ENCOUNTER — Other Ambulatory Visit: Payer: Self-pay | Admitting: Nurse Practitioner

## 2021-03-19 DIAGNOSIS — B181 Chronic viral hepatitis B without delta-agent: Secondary | ICD-10-CM

## 2021-03-25 ENCOUNTER — Ambulatory Visit
Admission: RE | Admit: 2021-03-25 | Discharge: 2021-03-25 | Disposition: A | Discharge status: Home / Self Care | Payer: BC Managed Care – PPO | Source: Ambulatory Visit | Attending: Nurse Practitioner | Admitting: Nurse Practitioner

## 2021-03-25 DIAGNOSIS — B181 Chronic viral hepatitis B without delta-agent: Secondary | ICD-10-CM | POA: Diagnosis not present

## 2021-04-08 ENCOUNTER — Other Ambulatory Visit: Payer: Self-pay

## 2021-04-08 MED ORDER — DOFETILIDE 250 MCG PO CAPS
ORAL_CAPSULE | ORAL | 2 refills | Status: DC
Start: 2021-04-08 — End: 2021-04-10

## 2021-04-10 ENCOUNTER — Other Ambulatory Visit (HOSPITAL_COMMUNITY): Payer: Self-pay | Admitting: *Deleted

## 2021-04-10 MED ORDER — DOFETILIDE 125 MCG PO CAPS: 125.0000 ug | ORAL_CAPSULE | Freq: Two times a day (BID) | ORAL | 1 refills | Status: DC

## 2021-04-10 MED ORDER — DOFETILIDE 250 MCG PO CAPS: 250.0000 ug | ORAL_CAPSULE | Freq: Two times a day (BID) | ORAL | 2 refills | Status: DC

## 2021-04-29 DIAGNOSIS — M109 Gout, unspecified: Secondary | ICD-10-CM | POA: Diagnosis not present

## 2021-04-29 DIAGNOSIS — G894 Chronic pain syndrome: Secondary | ICD-10-CM | POA: Diagnosis not present

## 2021-04-29 DIAGNOSIS — Z0000 Encounter for general adult medical examination without abnormal findings: Secondary | ICD-10-CM | POA: Diagnosis not present

## 2021-04-29 DIAGNOSIS — Z125 Encounter for screening for malignant neoplasm of prostate: Secondary | ICD-10-CM | POA: Diagnosis not present

## 2021-04-29 DIAGNOSIS — Z23 Encounter for immunization: Secondary | ICD-10-CM | POA: Diagnosis not present

## 2021-04-29 DIAGNOSIS — E039 Hypothyroidism, unspecified: Secondary | ICD-10-CM | POA: Diagnosis not present

## 2021-04-29 DIAGNOSIS — Z13220 Encounter for screening for lipoid disorders: Secondary | ICD-10-CM | POA: Diagnosis not present

## 2021-04-29 DIAGNOSIS — R7303 Prediabetes: Secondary | ICD-10-CM | POA: Diagnosis not present

## 2021-07-03 ENCOUNTER — Other Ambulatory Visit: Payer: Self-pay

## 2021-07-03 ENCOUNTER — Ambulatory Visit (HOSPITAL_COMMUNITY)
Admission: RE | Admit: 2021-07-03 | Discharge: 2021-07-03 | Disposition: A | Discharge status: Home / Self Care | Payer: BC Managed Care – PPO | Source: Ambulatory Visit | Attending: Nurse Practitioner | Admitting: Nurse Practitioner

## 2021-07-03 ENCOUNTER — Encounter (HOSPITAL_COMMUNITY): Payer: Self-pay | Admitting: Nurse Practitioner

## 2021-07-03 VITALS — BP 118/74 | HR 57 | Ht 72.0 in | Wt 213.0 lb

## 2021-07-03 DIAGNOSIS — D6869 Other thrombophilia: Secondary | ICD-10-CM

## 2021-07-03 DIAGNOSIS — I4819 Other persistent atrial fibrillation: Secondary | ICD-10-CM | POA: Diagnosis not present

## 2021-07-03 DIAGNOSIS — R001 Bradycardia, unspecified: Secondary | ICD-10-CM | POA: Insufficient documentation

## 2021-07-03 DIAGNOSIS — E0500 Thyrotoxicosis with diffuse goiter without thyrotoxic crisis or storm: Secondary | ICD-10-CM | POA: Diagnosis not present

## 2021-07-03 LAB — CBC
HCT: 45.4 % (ref 39.0–52.0)
Hemoglobin: 15.1 g/dL (ref 13.0–17.0)
MCH: 30.5 pg (ref 26.0–34.0)
MCHC: 33.3 g/dL (ref 30.0–36.0)
MCV: 91.7 fL (ref 80.0–100.0)
Platelets: 225 10*3/uL (ref 150–400)
RBC: 4.95 MIL/uL (ref 4.22–5.81)
RDW: 14.1 % (ref 11.5–15.5)
WBC: 5.3 10*3/uL (ref 4.0–10.5)
nRBC: 0 % (ref 0.0–0.2)

## 2021-07-03 LAB — BASIC METABOLIC PANEL
Anion gap: 3 — ABNORMAL LOW (ref 5–15)
BUN: 10 mg/dL (ref 8–23)
CO2: 29 mmol/L (ref 22–32)
Calcium: 9.2 mg/dL (ref 8.9–10.3)
Chloride: 104 mmol/L (ref 98–111)
Creatinine, Ser: 0.8 mg/dL (ref 0.61–1.24)
GFR, Estimated: >60 mL/min (ref >60)
Glucose, Bld: 93 mg/dL (ref 70–99)
Potassium: 4.7 mmol/L (ref 3.5–5.1)
Sodium: 136 mmol/L (ref 135–145)

## 2021-07-03 LAB — MAGNESIUM: Magnesium: 2.2 mg/dL (ref 1.7–2.4)

## 2021-07-03 NOTE — Progress Notes (Signed)
Primary Care Physician: Georgann Housekeeper, MD Referring Physician: Dr. Johney Frame  Cardiologist: Dr. Lilyan Gilford is a 65 y.o. male with a h/o Graves's disease, s/p RAI ablation, persistent afib since spring of 2020.  He recently saw Dr. Johney Frame virtually and he suggested starting  xarelto 20 mg daily and planning on cardioversion. CHA2DS2VASc score of 0. He has been on xarelto 20 mg daily since 07/14/19.  He is self employed, no drug insurance.  He does not drink alcohol, no tobacco, some caffeine, pending a sleep study with Dr. Earl Gala. Pt does c/o of significant  fatigue since he has been in afib.   Dr. Jenel Lucks recommendation was to start flecainide + BB at this visit after 3 weeks on anticoagulation followed by cardioversion (if drug did not convert pt)\  F/u in afib clinic, 09/08/19. Pt had unsuccessful  cardioversion, he did not convert. He is now here to discuss options. The pt and wife really want to try to  get him back in rhythm despite not having insurance. They are willing to pay out of pocket for a tikosyn admit. He is not an ablation candidate at this point and this would be an extremely expensive procedure.  F/u in afib clinic for tikosyn admit, 4/20. He now has insurance and can afford drug thru [facility], if not his new insurance. No benadryl use, no missed anticoagulation. He did stop trazodone many days ago.Off flecainide weeks ago.   F/u tikosyn admit, 11/04/19. He had successful conversion to SR with drug and DCCV. He feels much  Improved.  Qtc is prolonged at 503 ms. He does report that he had gout in his knee and had to have it drained with a shot of cortisone earlier in the week. Much improved.   F/u in afib clinic for tikosyn surveillance. He reports that he has been staying in [place] on [place]. He is very compliant with  drug. He is not on anticoagulation for a CHA2DS2VASc  score of 0.   Today, he denies symptoms of palpitations, chest pain, shortness of breath, orthopnea,  PND, lower extremity edema, dizziness, presyncope, syncope, or neurologic sequela. The patient is tolerating medications without difficulties and is otherwise without complaint today.   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   Arthritis    Body mass index 31.0-31.9, adult    Cervical disc disease    Chronic back pain    Chronic neck pain    Dyspnea    Eosinophilic disorder    Family history of adverse reaction to anesthesia    mother had nausea   Graves' disease    H/O colonoscopy 12/2008   Hyperlipidemia    Hyperthyroidism    s/p RAI ablation   Hypothyroidism    Insomnia    Lumbar back pain    Osteoporosis    Persistent atrial fibrillation (HCC) 04/12/2019   Pruritus, unspecified    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    Past Surgical History:  Procedure Laterality Date   CARDIOVERSION N/A 09/01/2019   Procedure: CARDIOVERSION;  Surgeon: Nahser, Deloris Ping, MD;  Location: [facility] ENDOSCOPY;  Service: Cardiovascular;  Laterality: N/A;   CARDIOVERSION N/A 10/27/2019   Procedure: CARDIOVERSION;  Surgeon: Lewayne Bunting, MD;  Location: [facility] ENDOSCOPY;  Service: Cardiovascular;  Laterality: N/A;   FISSURECTOMY     HAND SURGERY     KNEE  ARTHROSCOPY  1999/2007   right knee   LIPOMA EXCISION     back   NECK SURGERY     SHOULDER SURGERY      Current Outpatient Medications  Medication Sig Dispense Refill   cetirizine (ZYRTEC) 10 MG tablet Take 10 mg by mouth as needed.     colchicine 0.6 MG tablet Take 1 tablet by mouth as needed for irritation.     dofetilide (TIKOSYN) 125 MCG capsule Take 1 capsule (125 mcg total) by mouth 2 (two) times daily. Take with dose to equal twice a day 180 capsule 1   dofetilide (TIKOSYN) 250 MCG capsule Take 1 capsule (250 mcg total) by mouth 2 (two) times daily. Take with dose to equal twice a day 180 capsule 2    EPINEPHrine 0.3 mg/0.3 mL IJ SOAJ injection Inject 1 mL into the skin as needed for allergies.     levothyroxine (SYNTHROID) 175 MCG tablet Take 175 mcg by mouth daily before breakfast.     loteprednol (LOTEMAX) 0.5 % ophthalmic suspension Place 1 drop into both eyes as needed for irritation.     Melatonin 10 MG TABS Take 10 mg by mouth at bedtime.     Oxycodone HCl 10 MG TABS Take 1 tablet by mouth as needed for pain.     VEMLIDY 25 MG TABS Take 25 mg by mouth daily.      zolpidem (AMBIEN) 10 MG tablet Take 10 mg by mouth at bedtime as needed for sleep.      No current facility-administered medications for this encounter.    Allergies  Allergen Reactions   Bee Venom Anaphylaxis and Swelling    Bee Sting-     Social History   Socioeconomic History   Marital status: Married    Spouse name: Beth   Number of children: Not on file   Years of education: Not on file   Highest education level: Not on file  Occupational History   Not on file  Tobacco Use   Smoking status: Never   Smokeless tobacco: Never  Substance and Sexual Activity   Alcohol use: Not Currently    Alcohol/week: 1.0 - 2.0 standard drink    Types: 1 - 2 Cans of beer per week   Drug use: No   Sexual activity: Not Currently  Other Topics Concern   Not on file  Social History Narrative   Lives in [place] with spouse.   Self employed in [employer]   Social Determinants of Health   Financial Resource Strain: Not on file  Food Insecurity: Not on file  Transportation Needs: Not on file  Physical Activity: Not on file  Stress: Not on file  Social Connections: Not on file  Intimate Partner Violence: Not on file    Family History  Problem Relation Age of Onset   Cancer Mother        skin   Atrial fibrillation Mother     ROS- All systems are reviewed and negative except as per the HPI above  Physical Exam: Vitals:   07/03/21 1354  BP: 118/74  Pulse: (!) 57  Weight: 96.6 kg  Height: 6'  (1.829 m)   Wt Readings from Last 3 Encounters:  07/03/21 96.6 kg  12/26/20 97.1 kg  06/25/20 98.4 kg    Labs: Lab Results  Component Value Date   NA 145 (H) 12/26/2020   K 5.1 12/26/2020   CL 106 12/26/2020   CO2 24 12/26/2020  GLUCOSE 91 12/26/2020   BUN 12 12/26/2020   CREATININE 1.01 12/26/2020   CALCIUM 9.9 12/26/2020   MG 2.3 12/26/2020   Lab Results  Component Value Date   INR 1.7 (H) 10/26/2019   No results found for: CHOL, HDL, LDLCALC, TRIG   GEN- The patient is well appearing, alert and oriented x 3 today.   Head- normocephalic, atraumatic Eyes-  Sclera clear, conjunctiva pink Ears- hearing intact Oropharynx- clear Neck- supple, no JVP Lymph- no cervical lymphadenopathy Lungs- Clear to ausculation bilaterally, normal work of breathing Heart- regular rate and rhythm, no murmurs, rubs or gallops, PMI not laterally displaced GI- soft, NT, ND, + BS Extremities- no clubbing, cyanosis, or edema MS- no significant deformity or atrophy Skin- no rash or lesion Psych- euthymic mood, full affect Neuro- strength and sensation are intact  EKG- sinus brady at 57  bpm, PR int 198 ms, qrs int 84 ms, qtc 428 ms  Echo-IMPRESSIONS     1. Left ventricular ejection fraction, by visual estimation, is 60 to  65%. The left ventricle has normal function. Normal left ventricular size.  There is no left ventricular hypertrophy.   2. Left ventricular diastolic function could not be evaluated pattern of  LV diastolic filling.   3. Global right ventricle has normal systolic function.The right  ventricular size is normal. No increase in right ventricular wall  thickness.   4. Left atrial size was normal.   5. Right atrial size was normal.   6. The mitral valve is normal in structure. Trace mitral valve  regurgitation. No evidence of mitral stenosis.   7. The tricuspid valve is normal in structure. Tricuspid valve  regurgitation is trivial.   8. The aortic valve is tricuspid  Aortic valve regurgitation is moderate  by color flow Doppler. Structurally normal aortic valve, with no evidence  of sclerosis or stenosis.   9. The pulmonic valve was grossly normal. Pulmonic valve regurgitation is  trivial by color flow Doppler.  10. Mildly elevated pulmonary artery systolic pressure.  11. The inferior vena cava is dilated in size with <50% respiratory  variability, suggesting right atrial pressure of 15 mmHg.   FINDINGS   Left Ventricle: Left ventricular ejection fraction, by visual estimation,  is 60 to 65%. The left ventricle has normal function. No evidence of left  ventricular regional wall motion abnormalities. There is no left  ventricular hypertrophy. Normal left  ventricular size. Spectral Doppler shows Left ventricular diastolic  function could not be evaluated pattern of LV diastolic filling. Normal left atrial size     Assessment and Plan: 1. Persistent symptomatic  afib since March 2020 Failed flecainide load and cardioversion 09/01/19 Underwent successful tikosyn loading but dose had to be reduced to 375 mcg bid for lengthening of qt Staying in SR  Cbc/bmet today   2. CHA2DS2VASc score of 0  Off anticoagulation   F/u here in 6 months    Lupita Leash C. Matthew Folks [facility] [address] [place], [place] [address] [phone]

## 2021-09-17 DIAGNOSIS — K7401 Hepatic fibrosis, early fibrosis: Secondary | ICD-10-CM | POA: Diagnosis not present

## 2021-09-17 DIAGNOSIS — K760 Fatty (change of) liver, not elsewhere classified: Secondary | ICD-10-CM | POA: Diagnosis not present

## 2021-09-17 DIAGNOSIS — B181 Chronic viral hepatitis B without delta-agent: Secondary | ICD-10-CM | POA: Diagnosis not present

## 2021-09-18 ENCOUNTER — Other Ambulatory Visit: Payer: Self-pay | Admitting: Nurse Practitioner

## 2021-09-18 DIAGNOSIS — B181 Chronic viral hepatitis B without delta-agent: Secondary | ICD-10-CM

## 2021-09-18 DIAGNOSIS — K7401 Hepatic fibrosis, early fibrosis: Secondary | ICD-10-CM

## 2021-09-18 DIAGNOSIS — K760 Fatty (change of) liver, not elsewhere classified: Secondary | ICD-10-CM

## 2021-09-25 ENCOUNTER — Other Ambulatory Visit: Payer: Self-pay | Admitting: Internal Medicine

## 2021-09-25 ENCOUNTER — Other Ambulatory Visit (HOSPITAL_COMMUNITY): Payer: Self-pay

## 2021-09-25 MED ORDER — DOFETILIDE 250 MCG PO CAPS: 250.0000 ug | ORAL_CAPSULE | Freq: Two times a day (BID) | ORAL | 1 refills | Status: DC

## 2021-09-25 NOTE — Telephone Encounter (Signed)
This is a A-Fib clinic pt 

## 2021-09-30 ENCOUNTER — Ambulatory Visit
Admission: RE | Admit: 2021-09-30 | Discharge: 2021-09-30 | Disposition: A | Discharge status: Home / Self Care | Payer: BC Managed Care – PPO | Source: Ambulatory Visit | Attending: Nurse Practitioner | Admitting: Nurse Practitioner

## 2021-09-30 ENCOUNTER — Other Ambulatory Visit: Payer: Self-pay

## 2021-09-30 DIAGNOSIS — K760 Fatty (change of) liver, not elsewhere classified: Secondary | ICD-10-CM

## 2021-09-30 DIAGNOSIS — B181 Chronic viral hepatitis B without delta-agent: Secondary | ICD-10-CM

## 2021-09-30 DIAGNOSIS — K7401 Hepatic fibrosis, early fibrosis: Secondary | ICD-10-CM

## 2021-09-30 DIAGNOSIS — B191 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: Secondary | ICD-10-CM | POA: Diagnosis not present

## 2021-10-04 ENCOUNTER — Other Ambulatory Visit (HOSPITAL_COMMUNITY): Payer: Self-pay | Admitting: Nurse Practitioner

## 2021-10-28 DIAGNOSIS — I4891 Unspecified atrial fibrillation: Secondary | ICD-10-CM | POA: Diagnosis not present

## 2021-10-28 DIAGNOSIS — E039 Hypothyroidism, unspecified: Secondary | ICD-10-CM | POA: Diagnosis not present

## 2021-10-28 DIAGNOSIS — G4700 Insomnia, unspecified: Secondary | ICD-10-CM | POA: Diagnosis not present

## 2021-10-28 DIAGNOSIS — G894 Chronic pain syndrome: Secondary | ICD-10-CM | POA: Diagnosis not present

## 2022-01-01 ENCOUNTER — Encounter (HOSPITAL_COMMUNITY): Payer: Self-pay | Admitting: Nurse Practitioner

## 2022-01-01 ENCOUNTER — Ambulatory Visit (HOSPITAL_COMMUNITY)
Admission: RE | Admit: 2022-01-01 | Discharge: 2022-01-01 | Disposition: A | Discharge status: Home / Self Care | Payer: BC Managed Care – PPO | Source: Ambulatory Visit | Attending: Nurse Practitioner | Admitting: Nurse Practitioner

## 2022-01-01 VITALS — BP 126/74 | HR 64 | Ht 72.0 in | Wt 208.4 lb

## 2022-01-01 DIAGNOSIS — I4819 Other persistent atrial fibrillation: Secondary | ICD-10-CM

## 2022-01-01 DIAGNOSIS — Z79899 Other long term (current) drug therapy: Secondary | ICD-10-CM | POA: Diagnosis not present

## 2022-01-01 DIAGNOSIS — S83241A Other tear of medial meniscus, current injury, right knee, initial encounter: Secondary | ICD-10-CM | POA: Diagnosis not present

## 2022-01-01 LAB — BASIC METABOLIC PANEL
Anion gap: 9 (ref 5–15)
BUN: 10 mg/dL (ref 8–23)
CO2: 26 mmol/L (ref 22–32)
Calcium: 9.6 mg/dL (ref 8.9–10.3)
Chloride: 108 mmol/L (ref 98–111)
Creatinine, Ser: 0.85 mg/dL (ref 0.61–1.24)
GFR, Estimated: >60 mL/min (ref >60)
Glucose, Bld: 108 mg/dL — ABNORMAL HIGH (ref 70–99)
Potassium: 4.8 mmol/L (ref 3.5–5.1)
Sodium: 143 mmol/L (ref 135–145)

## 2022-01-01 LAB — MAGNESIUM: Magnesium: 2.3 mg/dL (ref 1.7–2.4)

## 2022-01-01 NOTE — Progress Notes (Signed)
Primary Care Physician: Georgann Housekeeper, MD Referring Physician: Dr. Johney Frame  Cardiologist: Dr. Lilyan Gilford is a 66 y.o. male with a h/o Graves's disease, s/p RAI ablation, persistent afib since spring of 2020.  He recently saw Dr. Johney Frame virtually and he suggested starting  xarelto 20 mg daily and planning on cardioversion. CHA2DS2VASc score of 0. He has been on xarelto 20 mg daily since 07/14/19.  He is self employed, no drug insurance.  He does not drink alcohol, no tobacco, some caffeine, pending a sleep study with Dr. Earl Gala. Pt does c/o of significant  fatigue since he has been in afib.   Dr. Jenel Lucks recommendation was to start flecainide + BB at this visit after 3 weeks on anticoagulation followed by cardioversion (if drug did not convert pt)\  F/u in afib clinic, 09/08/19. Pt had unsuccessful  cardioversion, he did not convert. He is now here to discuss options. The pt and wife really want to try to  get him back in rhythm despite not having insurance. They are willing to pay out of pocket for a tikosyn admit. He is not an ablation candidate at this point and this would be an extremely expensive procedure.  F/u in afib clinic for tikosyn admit, 4/20. He now has insurance and can afford drug thru [facility], if not his new insurance. No benadryl use, no missed anticoagulation. He did stop trazodone many days ago.Off flecainide weeks ago.   F/u tikosyn admit, 11/04/19. He had successful conversion to SR with drug and DCCV. He feels much  Improved.  Qtc is prolonged at 503 ms. He does report that he had gout in his knee and had to have it drained with a shot of cortisone earlier in the week. Much improved.   F/u in afib clinic for tikosyn surveillance. He reports that he has been staying in [place] on [place]. He is very compliant with  drug. He is not on anticoagulation for a CHA2DS2VASc  score of 0.   F/u in afib clinic, 01/01/22, for Tikosyn surveillance. He is doing well,compliant with  tikosyn administration and staying in [place] had a shot in his rt knee earlier today, they were offered a prednisone taper but they want to avoid with possibility it could flare his afib. Qt stable.   Today, he denies symptoms of palpitations, chest pain, shortness of breath, orthopnea, PND, lower extremity edema, dizziness, presyncope, syncope, or neurologic sequela. The patient is tolerating medications without difficulties and is otherwise without complaint today.   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   Arthritis    Body mass index 31.0-31.9, adult    Cervical disc disease    Chronic back pain    Chronic neck pain    Dyspnea    Eosinophilic disorder    Family history of adverse reaction to anesthesia    mother had nausea   Graves' disease    H/O colonoscopy 12/2008   Hyperlipidemia    Hyperthyroidism    s/p RAI ablation   Hypothyroidism    Insomnia    Lumbar back pain    Osteoporosis    Persistent atrial fibrillation (HCC) 04/12/2019   Pruritus, unspecified    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    Past Surgical History:  Procedure Laterality Date   CARDIOVERSION N/A 09/01/2019   Procedure: CARDIOVERSION;  Surgeon: Vesta Mixer,  MD;  Location: [facility] ENDOSCOPY;  Service: Cardiovascular;  Laterality: N/A;   CARDIOVERSION N/A 10/27/2019   Procedure: CARDIOVERSION;  Surgeon: Lewayne Bunting, MD;  Location: [facility] ENDOSCOPY;  Service: Cardiovascular;  Laterality: N/A;   FISSURECTOMY     HAND SURGERY     KNEE ARTHROSCOPY  1999/2007   right knee   LIPOMA EXCISION     back   NECK SURGERY     SHOULDER SURGERY      Current Outpatient Medications  Medication Sig Dispense Refill   cetirizine (ZYRTEC) 10 MG tablet Take 10 mg by mouth as needed.     colchicine 0.6 MG tablet Take 1 tablet by mouth as needed for irritation.     dofetilide (TIKOSYN) 125 MCG capsule TAKE 1 CAPSULE  BY MOUTH TWICE DAILY 180 capsule 1   dofetilide (TIKOSYN) 250 MCG capsule Take 1 capsule (250 mcg total) by mouth 2 (two) times daily. Take with dose to equal twice a day 180 capsule 1   EPINEPHrine 0.3 mg/0.3 mL IJ SOAJ injection Inject 1 mL into the skin as needed for allergies.     levothyroxine (SYNTHROID) 175 MCG tablet Take 175 mcg by mouth daily before breakfast.     loteprednol (LOTEMAX) 0.5 % ophthalmic suspension Place 1 drop into both eyes as needed for irritation.     Melatonin 10 MG TABS Take 10 mg by mouth at bedtime.     Oxycodone HCl 10 MG TABS Take 1 tablet by mouth as needed for pain.     VEMLIDY 25 MG TABS Take 25 mg by mouth daily.      zolpidem (AMBIEN) 10 MG tablet Take 10 mg by mouth at bedtime as needed for sleep.      No current facility-administered medications for this encounter.    Allergies  Allergen Reactions   Bee Venom Anaphylaxis and Swelling    Bee Sting-     Social History   Socioeconomic History   Marital status: Married    Spouse name: Beth   Number of children: Not on file   Years of education: Not on file   Highest education level: Not on file  Occupational History   Not on file  Tobacco Use   Smoking status: Never   Smokeless tobacco: Never  Substance and Sexual Activity   Alcohol use: Not Currently    Alcohol/week: 1.0 - 2.0 standard drink of alcohol    Types: 1 - 2 Cans of beer per week   Drug use: No   Sexual activity: Not Currently  Other Topics Concern   Not on file  Social History Narrative   Lives in [place] with spouse.   Self employed in [employer]   Social Determinants of Health   Financial Resource Strain: Not on file  Food Insecurity: Not on file  Transportation Needs: Not on file  Physical Activity: Not on file  Stress: Not on file  Social Connections: Not on file  Intimate Partner Violence: Not on file    Family History  Problem Relation Age of Onset   Cancer Mother        skin    Atrial fibrillation Mother     ROS- All systems are reviewed and negative except as per the HPI above  Physical Exam: Vitals:   01/01/22 1319  BP: 126/74  Pulse: 64  Weight: 94.5 kg  Height: 6' (1.829 m)   Wt Readings from Last 3 Encounters:  01/01/22 94.5 kg  07/03/21 96.6  kg  12/26/20 97.1 kg    Labs: Lab Results  Component Value Date   NA 136 07/03/2021   K 4.7 07/03/2021   CL 104 07/03/2021   CO2 29 07/03/2021   GLUCOSE 93 07/03/2021   BUN 10 07/03/2021   CREATININE 0.80 07/03/2021   CALCIUM 9.2 07/03/2021   MG 2.2 07/03/2021   Lab Results  Component Value Date   INR 1.7 (H) 10/26/2019   No results found for: "CHOL", "HDL", "LDLCALC", "TRIG"   GEN- The patient is well appearing, alert and oriented x 3 today.   Head- normocephalic, atraumatic Eyes-  Sclera clear, conjunctiva pink Ears- hearing intact Oropharynx- clear Neck- supple, no JVP Lymph- no cervical lymphadenopathy Lungs- Clear to ausculation bilaterally, normal work of breathing Heart- regular rate and rhythm, no murmurs, rubs or gallops, PMI not laterally displaced GI- soft, NT, ND, + BS Extremities- no clubbing, cyanosis, or edema MS- no significant deformity or atrophy Skin- no rash or lesion Psych- euthymic mood, full affect Neuro- strength and sensation are intact  EKG- normal sinus rhythm at 64 bpm , pr int 186 ms, qrs int 84 ms, 431 ms   Echo-IMPRESSIONS     1. Left ventricular ejection fraction, by visual estimation, is 60 to  65%. The left ventricle has normal function. Normal left ventricular size.  There is no left ventricular hypertrophy.   2. Left ventricular diastolic function could not be evaluated pattern of  LV diastolic filling.   3. Global right ventricle has normal systolic function.The right  ventricular size is normal. No increase in right ventricular wall  thickness.   4. Left atrial size was normal.   5. Right atrial size was normal.   6. The mitral valve is  normal in structure. Trace mitral valve  regurgitation. No evidence of mitral stenosis.   7. The tricuspid valve is normal in structure. Tricuspid valve  regurgitation is trivial.   8. The aortic valve is tricuspid Aortic valve regurgitation is moderate  by color flow Doppler. Structurally normal aortic valve, with no evidence  of sclerosis or stenosis.   9. The pulmonic valve was grossly normal. Pulmonic valve regurgitation is  trivial by color flow Doppler.  10. Mildly elevated pulmonary artery systolic pressure.  11. The inferior vena cava is dilated in size with <50% respiratory  variability, suggesting right atrial pressure of 15 mmHg.   FINDINGS   Left Ventricle: Left ventricular ejection fraction, by visual estimation,  is 60 to 65%. The left ventricle has normal function. No evidence of left  ventricular regional wall motion abnormalities. There is no left  ventricular hypertrophy. Normal left  ventricular size. Spectral Doppler shows Left ventricular diastolic  function could not be evaluated pattern of LV diastolic filling. Normal left atrial size     Assessment and Plan: 1. Persistent symptomatic  afib since March 2020 Failed flecainide load and cardioversion 09/01/19 Underwent successful tikosyn loading but dose had to be reduced to 375 mcg bid for lengthening of qt Staying in SR with stable qt  Mag today with  recent Kt at 5  2. CHA2DS2VASc score of 0  Off anticoagulation   F/u with Dr. Johney Frame  in 6 months    Elvina Sidle. [facility] [address] [place], [place] [address] [phone]

## 2022-01-13 DIAGNOSIS — M25561 Pain in right knee: Secondary | ICD-10-CM | POA: Diagnosis not present

## 2022-01-21 IMAGING — US US ABDOMEN LIMITED RUQ/ASCITES
1 series · 14 of 25 positions shown · non-contrast
Comparison: Right upper quadrant ultrasound dated 03/22/2020.

CLINICAL DATA: 64-year-old male with hepatitis B.

EXAM:
ULTRASOUND ABDOMEN LIMITED RIGHT UPPER QUADRANT

[Series 1: us abdomen limited ruq/ascites · 0.26mm/px · 14 of 36 slices shown]
[im 1/36]
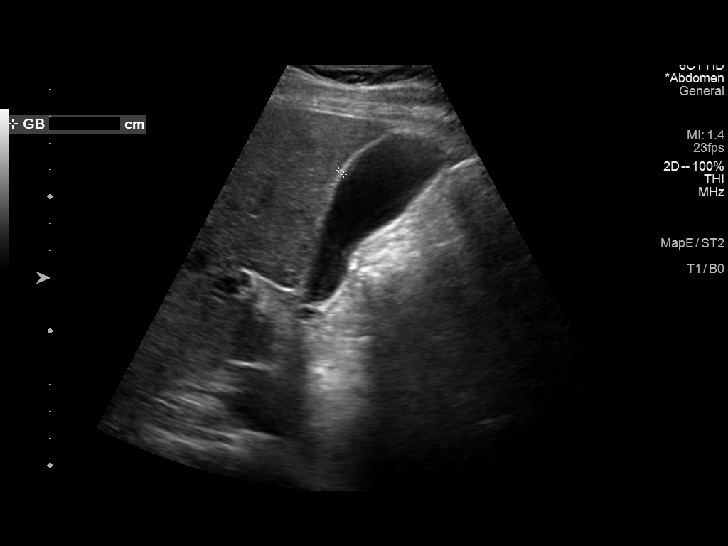
[im 3/36]
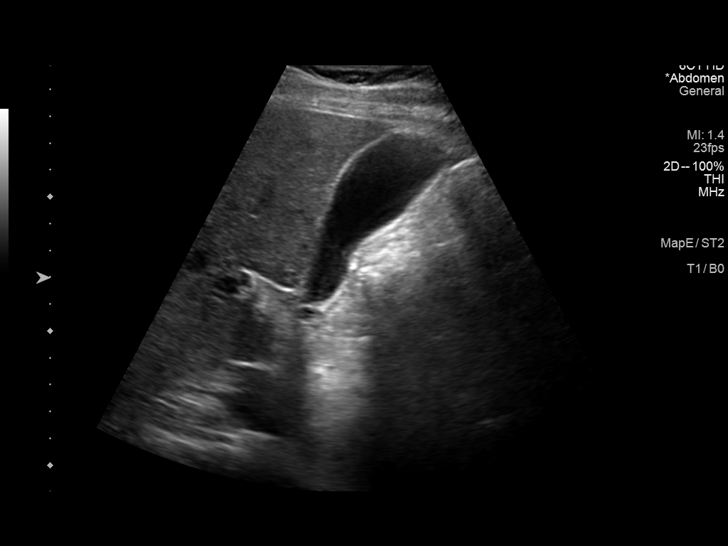
[im 6/36]
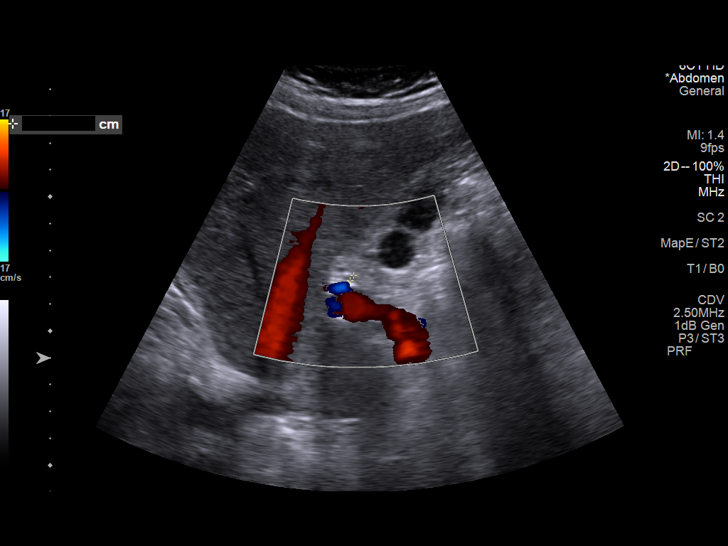
[im 9/36]
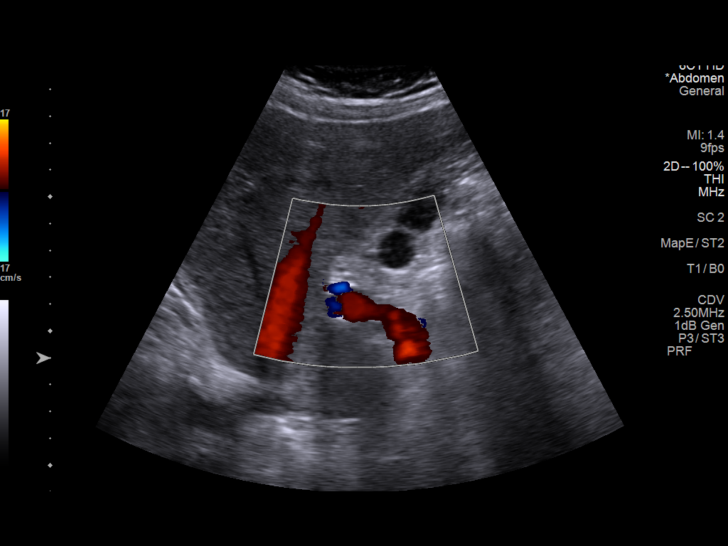
[im 12/36]
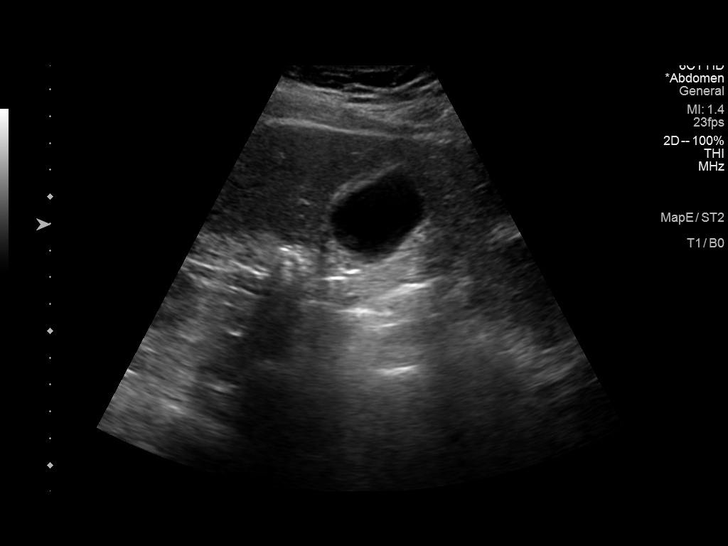
[im 14/36]
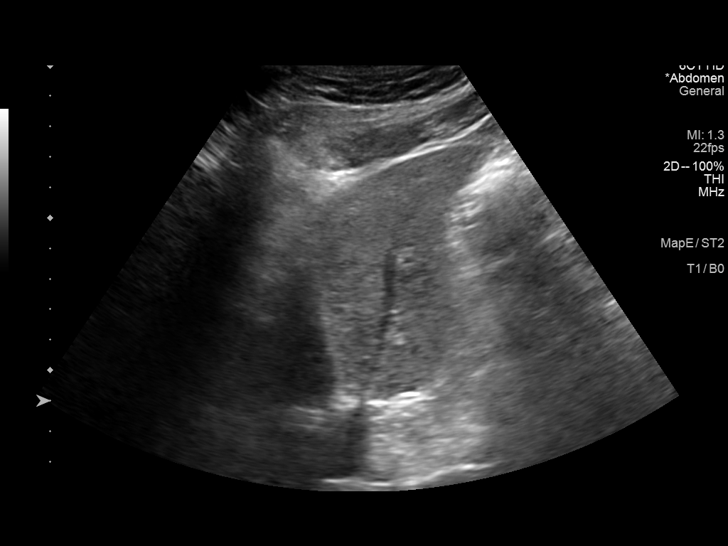
[im 17/36]
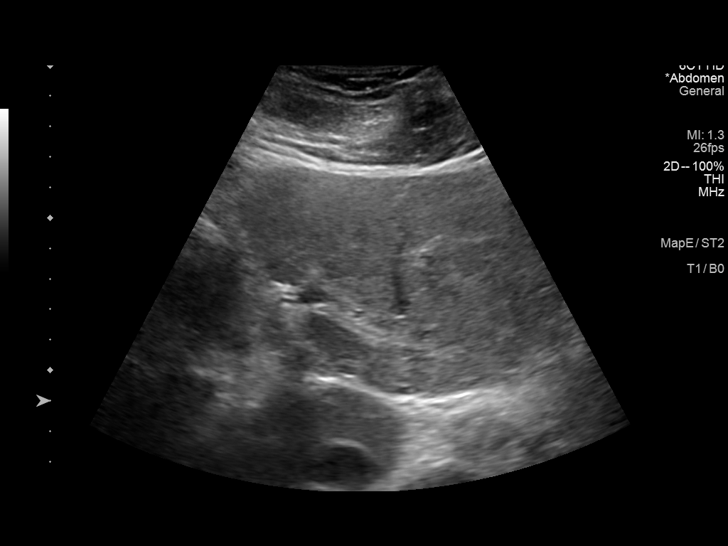
[im 19/36]
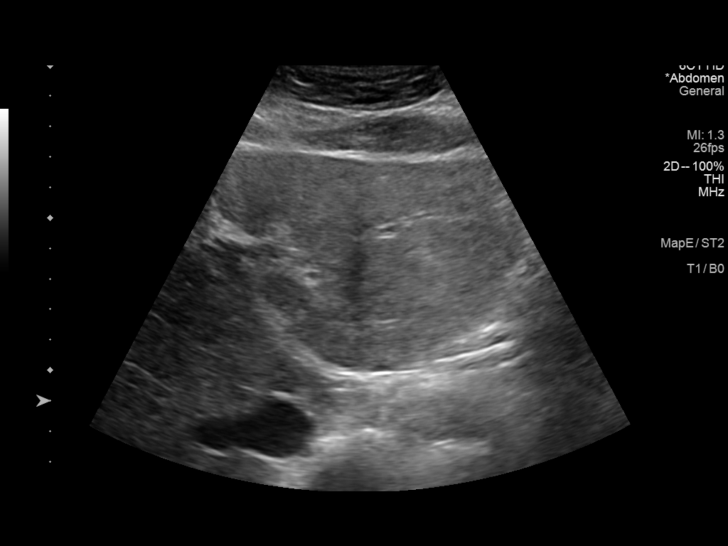
[im 22/36]
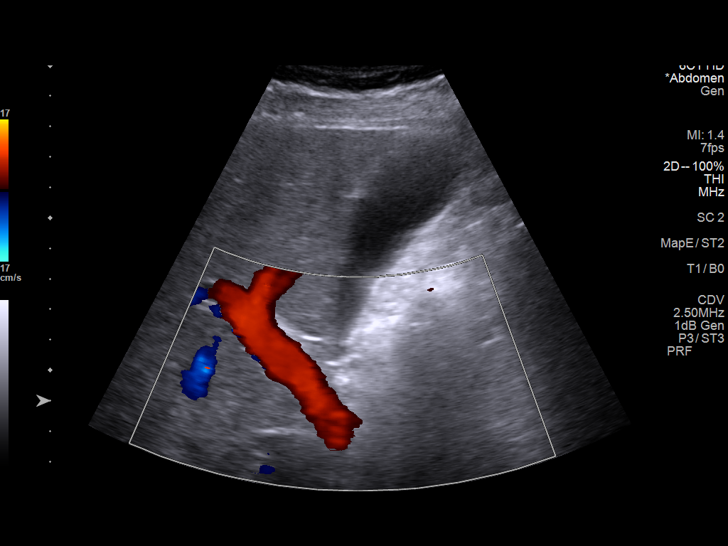
[im 24/36]
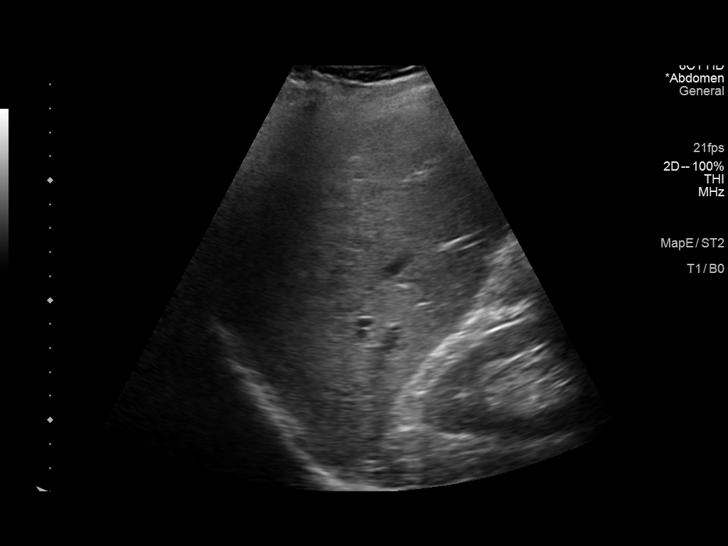
[im 27/36]
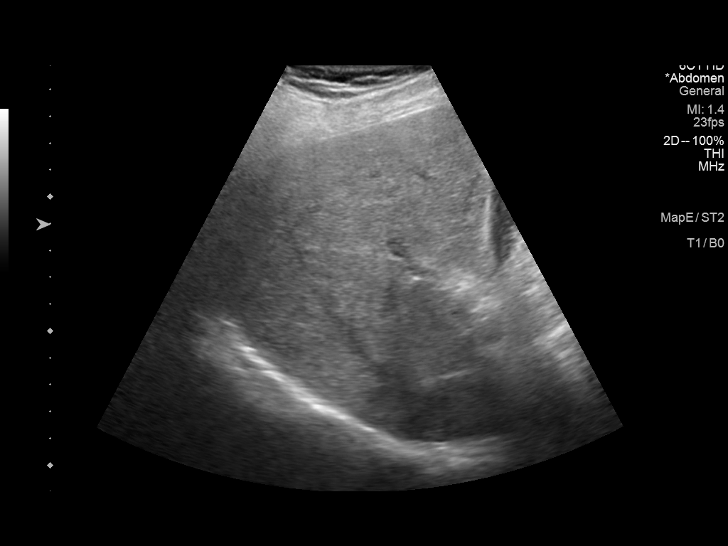
[im 30/36]
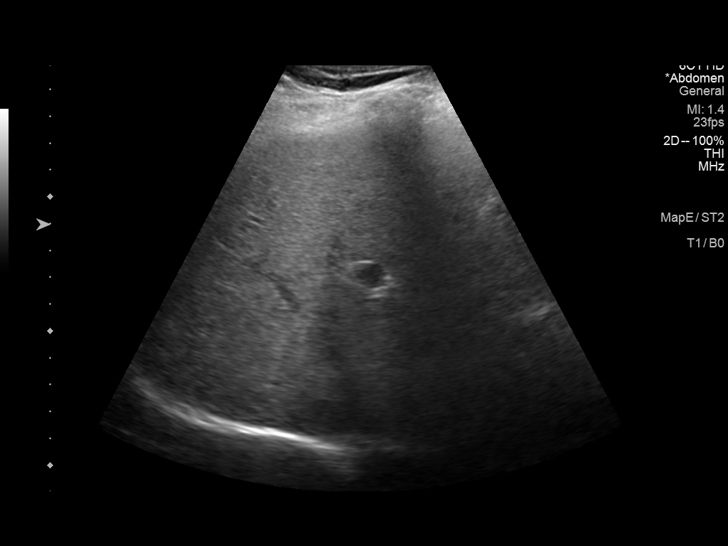
[im 33/36]
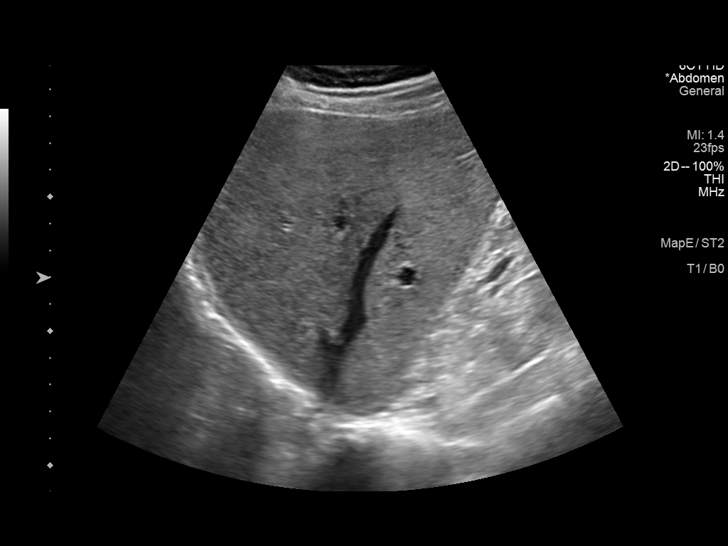
[im 36/36]
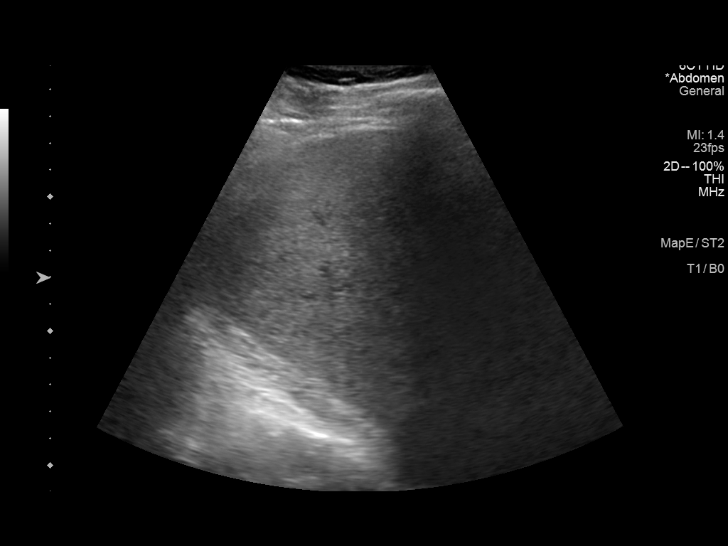

[14 of 25 positions shown; findings below may reference images not displayed]

FINDINGS: Gallbladder:

No gallstones or wall thickening visualized. No sonographic Murphy
sign noted by sonographer.

Common bile duct:

Diameter: 3 mm

Liver:

There is mild increased liver echogenicity most commonly seen in the
setting of fatty infiltration. Superimposed inflammation or fibrosis
is not excluded. Clinical correlation is recommended. Portal vein is
patent on color Doppler imaging with normal direction of blood flow
towards the liver.

Other: None.
IMPRESSION: Mild fatty liver, otherwise unremarkable right upper quadrant
ultrasound.

## 2022-02-27 DIAGNOSIS — M1711 Unilateral primary osteoarthritis, right knee: Secondary | ICD-10-CM | POA: Diagnosis not present

## 2022-03-07 ENCOUNTER — Other Ambulatory Visit: Payer: Self-pay | Admitting: Nurse Practitioner

## 2022-03-07 ENCOUNTER — Other Ambulatory Visit (HOSPITAL_COMMUNITY): Payer: Self-pay

## 2022-03-07 MED ORDER — DOFETILIDE 250 MCG PO CAPS: 250.0000 ug | ORAL_CAPSULE | Freq: Two times a day (BID) | ORAL | 1 refills | Status: DC

## 2022-03-14 ENCOUNTER — Telehealth: Payer: Self-pay | Admitting: *Deleted

## 2022-03-14 NOTE — Telephone Encounter (Signed)
   Pre-operative Risk Assessment    Patient Name: Joshua Lowe  DOB: 1955/08/06 MRN: [id number]      Request for Surgical Clearance  Procedure:   RIGHT TOTAL KNEE ARTHROPLASTY  Date of Surgery:  Clearance 06/02/22                                 Surgeon:  DR. Ollen Gross Surgeon's Group or Practice Name:  [facility] Phone number:  [phone] Fax number:  [phone] ATTN: Aida Raider   Type of Clearance Requested:   - Medical ; NO MEDICATIONS LISTED AS NEEDING TO BE HELD   Type of Anesthesia:   CHOICE   Additional requests/questions:    Elpidio Anis   03/14/2022, 4:04 PM

## 2022-03-18 NOTE — Telephone Encounter (Signed)
Primary Cardiologist:Philip Nahser, MD  Chart reviewed as part of pre-operative protocol coverage. Because of Joshua Lowe's past medical history and time since last visit, he/she will require a follow-up visit in order to better assess preoperative cardiovascular risk.  Pre-op covering staff: - Please schedule appointment and call patient to inform them. He has not been seen by a cardiologist since 12/2020. Has been followed by A Fib clinic but they do not provide surgical clearance. Needs to see Nahser or APP. - Please contact requesting surgeon's office via preferred method (i.e, phone, fax) to inform them of need for appointment prior to surgery.  No request to hold medications prior to procedure.   Joshua Aland, NP-C     03/18/2022, 10:08 AM [address], [address] Office [phone] Fax [phone]

## 2022-03-18 NOTE — Telephone Encounter (Signed)
Left message to call back for IN OFFICE PRE OP APPT with Dr. Melburn Popper or APP.

## 2022-03-19 NOTE — Telephone Encounter (Signed)
Pt has appt 04/21/22 with Dr. [name], will add pre op clearance needed to appt notes

## 2022-04-20 ENCOUNTER — Encounter: Payer: Self-pay | Admitting: Cardiovascular Disease

## 2022-04-20 NOTE — Progress Notes (Unsigned)
Cardiology Office Note:    Date:  04/21/2022   ID:  Lowe, Joshua 09-18-1955, MRN [id number]  PCP:  Joshua Housekeeper, MD  Cardiologist:  Joshua Miss, MD  Electrophysiologist:  None   Referring MD: Joshua Housekeeper, MD   Chief Complaint  Patient presents with   Atrial Fibrillation    History of Present Illness:    Joshua Lowe is a 66 y.o. male with a hx of atrial fib. Can occasionally feel that his HR is abn.   Talked with wife, Joshua Lowe .     Has had this for several months . Actually had palpitatins for years but they were off and on  Had hyperthyroidism Had radioactive iodine Now is on synthroid   Had hand surgery in March ,  Pre op ECG showed  Atrial fib . 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 Has some DOE walking up stairs and walking Is very fatigued in the am.  Takes an hour to "wak up "  years ago, he would wake up gasping  Does not snore according to wife.    Wife confirms that he is not as strong and fatiues easily .   Oct. 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   Arthritis    Body mass index 31.0-31.9, adult    Cervical disc disease    Chronic back pain    Chronic neck pain    Dyspnea    Eosinophilic disorder    Family history of adverse reaction to anesthesia    mother had nausea   Graves' disease    H/O colonoscopy 12/2008   Hyperlipidemia    Hyperthyroidism    s/p RAI  ablation   Hypothyroidism    Insomnia    Lumbar back pain    Osteoporosis    Persistent atrial fibrillation (HCC) 04/12/2019   Pruritus, unspecified    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     Past Surgical History:  Procedure Laterality Date   CARDIOVERSION N/A 09/01/2019   Procedure: CARDIOVERSION;  Surgeon: Joshua Lowe, Joshua Ping, MD;  Location[facility] ENDOSCOPY;  Service: Cardiovascular;  Laterality: N/A;   CARDIOVERSION N/A 10/27/2019   Procedure: CARDIOVERSION;  Surgeon: Joshua Bunting, MD;  Location: [facility] ENDOSCOPY;  Service: Cardiovascular;  Laterality: N/A;   FISSURECTOMY     HAND SURGERY     KNEE ARTHROSCOPY  1999/2007   right knee   LIPOMA EXCISION     back   NECK SURGERY     SHOULDER SURGERY      Current Medications: Current Meds  Medication Sig   cetirizine (ZYRTEC) 10 MG tablet Take 10 mg by mouth as needed.   colchicine 0.6 MG tablet Take 1 tablet by mouth as needed for irritation.   dofetilide (  TIKOSYN) 125 MCG capsule TAKE 1 CAPSULE BY MOUTH TWICE DAILY ( TAKE WITH THE CAPSULE TO EQUAL TWICE DAILY )   dofetilide (TIKOSYN) 250 MCG capsule Take 1 capsule (250 mcg total) by mouth 2 (two) times daily. Take with dose to equal twice a day   EPINEPHrine 0.3 mg/0.3 mL IJ SOAJ injection Inject 1 mL into the skin as needed for allergies.   levothyroxine (SYNTHROID) 175 MCG tablet Take 175 mcg by mouth daily before breakfast.   loteprednol (LOTEMAX) 0.5 % ophthalmic suspension Place 1 drop into both eyes as needed for irritation.   Melatonin 10 MG TABS Take 10 mg by mouth at bedtime.   Oxycodone HCl 10 MG TABS Take 1 tablet by mouth as needed for pain.   VEMLIDY 25 MG TABS Take 25 mg by mouth daily.    zolpidem (AMBIEN) 10 MG tablet Take 10 mg by mouth at bedtime as needed for sleep.      Allergies:   Bee venom   Social History   Socioeconomic History   Marital status: Married    Spouse name: Joshua Lowe   Number of children: Not on file    Years of education: Not on file   Highest education level: Not on file  Occupational History   Not on file  Tobacco Use   Smoking status: Never   Smokeless tobacco: Never  Substance and Sexual Activity   Alcohol use: Not Currently    Alcohol/week: 1.0 - 2.0 standard drink of alcohol    Types: 1 - 2 Cans of beer per week   Drug use: No   Sexual activity: Not Currently  Other Topics Concern   Not on file  Social History Narrative   Lives in [place] with spouse.   Self employed in [employer]   Social Determinants of Health   Financial Resource Strain: Not on file  Food Insecurity: Not on file  Transportation Needs: Not on file  Physical Activity: Not on file  Stress: Not on file  Social Connections: Not on file     Family History: The patient's family history includes Atrial fibrillation in his mother; Cancer in his mother.  ROS:   Please see the history of present illness.     All other systems reviewed and are negative.  EKGs/Labs/Other Studies Reviewed:    The following studies were reviewed today:   EKG:        Recent Labs: 07/03/2021: Hemoglobin 15.1; Platelets 225 01/01/2022: BUN 10; Creatinine, Ser 0.85; Magnesium 2.3; Potassium 4.8; Sodium 143  Recent Lipid Panel No results found for: "CHOL", "TRIG", "HDL", "CHOLHDL", "VLDL", "LDLCALC", "LDLDIRECT"  Physical Exam:    Physical Exam: Blood pressure 112/70, pulse (!) 56, height 6' (1.829 m), weight 211 lb (95.7 kg), SpO2 99 %.       GEN:  Well nourished, well developed in no acute distress HEENT: Normal NECK: No JVD; No carotid bruits LYMPHATICS: No lymphadenopathy CARDIAC: RRR , no murmurs, rubs, gallops RESPIRATORY:  Clear to auscultation without rales, wheezing or rhonchi  ABDOMEN: Soft, non-tender, non-distended MUSCULOSKELETAL:  No edema; No deformity  SKIN: Warm and dry NEUROLOGIC:  Alert and oriented x 3   ASSESSMENT:    No diagnosis found.  PLAN:      Atrial Fib:   CHADS2VASC   =  0    .  Very stable on tikosyn.   Mag and potassium level  He is at low risk for his upcoming knee surgery .  He had lots of questions about needing Afib ablation . ? Changing dose of tikosyn  I told him I thought the current dose of Tikosyn seems to be working very well and that it would not change it.  He wanted more specific answers we will make him an appointment to see one of our electrophysiologist.      Medication Adjustments/Labs and Tests Ordered: Current medicines are reviewed at length with the patient today.  Concerns regarding medicines are outlined above.  No orders of the defined types were placed in this encounter.  No orders of the defined types were placed in this encounter.   Patient Instructions  Medication Instructions:  Your physician recommends that you continue on your current medications as directed. Please refer to the Current Medication list given to you today.  *If you need a refill on your cardiac medications before your next appointment, please call your pharmacy*  Lab Work: Your physician recommends that you return for lab work in: 6 months for  BMP and Magnesium.   If you have labs (blood work) drawn today and your tests are completely normal, you will receive your results only by: [place] (if you have MyChart) OR A paper copy in the mail If you have any lab test that is abnormal or we need to change your treatment, we will call you to review the results.  Testing/Procedures: None ordered today.  Follow-Up: At [facility], you and your health needs are our priority.  As part of our continuing mission to provide you with exceptional heart care, we have created designated Provider Care Teams.  These Care Teams include your primary Cardiologist (physician) and Advanced Practice Providers (APPs -  Physician Assistants and Nurse Practitioners) who all work together to provide you with the care you need, when you need  it.  We recommend signing up for the patient portal called "MyChart".  Sign up information is provided on this After Visit Summary.  MyChart is used to connect with patients for Virtual Visits (Telemedicine).  Patients are able to view lab/test results, encounter notes, upcoming appointments, etc.  Non-urgent messages can be sent to your provider as well.   To learn more about what you can do with MyChart, go to [email or web address].    Your next appointment:   6 month(s) with EKG  The format for your next appointment:   In Person  Provider:   Mertie Moores, MD      Important Information About Sugar         Signed, Mertie Moores, MD  04/21/2022 2:38 PM    [place]

## 2022-04-21 ENCOUNTER — Other Ambulatory Visit: Payer: Self-pay | Admitting: Nurse Practitioner

## 2022-04-21 ENCOUNTER — Ambulatory Visit: Payer: BC Managed Care – PPO | Admitting: Cardiovascular Disease

## 2022-04-21 ENCOUNTER — Encounter: Payer: Self-pay | Admitting: Cardiovascular Disease

## 2022-04-21 ENCOUNTER — Ambulatory Visit: Payer: BC Managed Care – PPO | Attending: Cardiovascular Disease | Admitting: Cardiovascular Disease

## 2022-04-21 VITALS — BP 112/70 | HR 56 | Ht 72.0 in | Wt 211.0 lb

## 2022-04-21 DIAGNOSIS — I480 Paroxysmal atrial fibrillation: Secondary | ICD-10-CM

## 2022-04-21 DIAGNOSIS — K7401 Hepatic fibrosis, early fibrosis: Secondary | ICD-10-CM

## 2022-04-21 DIAGNOSIS — K760 Fatty (change of) liver, not elsewhere classified: Secondary | ICD-10-CM | POA: Diagnosis not present

## 2022-04-21 DIAGNOSIS — B181 Chronic viral hepatitis B without delta-agent: Secondary | ICD-10-CM

## 2022-04-21 NOTE — Patient Instructions (Signed)
Medication Instructions:  Your physician recommends that you continue on your current medications as directed. Please refer to the Current Medication list given to you today.  *If you need a refill on your cardiac medications before your next appointment, please call your pharmacy*  Lab Work: Your physician recommends that you return for lab work in: 6 months for  BMP and Magnesium.   If you have labs (blood work) drawn today and your tests are completely normal, you will receive your results only by: [place] (if you have MyChart) OR A paper copy in the mail If you have any lab test that is abnormal or we need to change your treatment, we will call you to review the results.  Testing/Procedures: None ordered today.  Follow-Up: At [facility], you and your health needs are our priority.  As part of our continuing mission to provide you with exceptional heart care, we have created designated Provider Care Teams.  These Care Teams include your primary Cardiologist (physician) and Advanced Practice Providers (APPs -  Physician Assistants and Nurse Practitioners) who all work together to provide you with the care you need, when you need it.  We recommend signing up for the patient portal called "MyChart".  Sign up information is provided on this After Visit Summary.  MyChart is used to connect with patients for Virtual Visits (Telemedicine).  Patients are able to view lab/test results, encounter notes, upcoming appointments, etc.  Non-urgent messages can be sent to your provider as well.   To learn more about what you can do with MyChart, go to [email or web address].    Your next appointment:   6 month(s) with EKG  The format for your next appointment:   In Person  Provider:   Mertie Moores, MD      Important Information About Sugar

## 2022-04-28 ENCOUNTER — Ambulatory Visit
Admission: RE | Admit: 2022-04-28 | Discharge: 2022-04-28 | Disposition: A | Discharge status: Home / Self Care | Payer: BC Managed Care – PPO | Source: Ambulatory Visit | Attending: Nurse Practitioner | Admitting: Nurse Practitioner

## 2022-04-28 DIAGNOSIS — B181 Chronic viral hepatitis B without delta-agent: Secondary | ICD-10-CM

## 2022-04-28 DIAGNOSIS — K760 Fatty (change of) liver, not elsewhere classified: Secondary | ICD-10-CM

## 2022-04-28 DIAGNOSIS — K7401 Hepatic fibrosis, early fibrosis: Secondary | ICD-10-CM

## 2022-04-29 DIAGNOSIS — M179 Osteoarthritis of knee, unspecified: Secondary | ICD-10-CM | POA: Diagnosis not present

## 2022-04-29 DIAGNOSIS — E039 Hypothyroidism, unspecified: Secondary | ICD-10-CM | POA: Diagnosis not present

## 2022-04-29 DIAGNOSIS — Z125 Encounter for screening for malignant neoplasm of prostate: Secondary | ICD-10-CM | POA: Diagnosis not present

## 2022-04-29 DIAGNOSIS — Z23 Encounter for immunization: Secondary | ICD-10-CM | POA: Diagnosis not present

## 2022-04-29 DIAGNOSIS — Z0000 Encounter for general adult medical examination without abnormal findings: Secondary | ICD-10-CM | POA: Diagnosis not present

## 2022-04-29 DIAGNOSIS — Z13220 Encounter for screening for lipoid disorders: Secondary | ICD-10-CM | POA: Diagnosis not present

## 2022-04-29 DIAGNOSIS — R7303 Prediabetes: Secondary | ICD-10-CM | POA: Diagnosis not present

## 2022-04-29 DIAGNOSIS — G4733 Obstructive sleep apnea (adult) (pediatric): Secondary | ICD-10-CM | POA: Diagnosis not present

## 2022-04-29 DIAGNOSIS — Z01818 Encounter for other preprocedural examination: Secondary | ICD-10-CM | POA: Diagnosis not present

## 2022-04-29 DIAGNOSIS — I4891 Unspecified atrial fibrillation: Secondary | ICD-10-CM | POA: Diagnosis not present

## 2022-04-29 DIAGNOSIS — M109 Gout, unspecified: Secondary | ICD-10-CM | POA: Diagnosis not present

## 2022-05-20 DIAGNOSIS — Z23 Encounter for immunization: Secondary | ICD-10-CM | POA: Diagnosis not present

## 2022-05-23 NOTE — Progress Notes (Signed)
Anesthesia Review:  PCP: Cardiologist : DR Kristeen Miss- LOV 04/21/22  Chest x-ray : EKG : 01/01/22  Ct [place]- 08/07/20  Echo : 2020  Stress test: Cardiac Cath :  Activity level:  Sleep Study/ CPAP : Fasting Blood Sugar :      / Checks Blood Sugar -- times a day:   Blood Thinner/ Instructions /Last Dose: ASA / Instructions/ Last Dose :

## 2022-05-26 NOTE — H&P (Signed)
TOTAL KNEE ADMISSION H&P  Patient is being admitted for right total knee arthroplasty.  Subjective:  Chief Complaint: Right knee pain.  HPI: Joshua Lowe, 66 y.o. male has a history of pain and functional disability in the right knee due to arthritis and has failed non-surgical conservative treatments for greater than 12 weeks to include NSAID's and/or analgesics and activity modification. Onset of symptoms was gradual, starting >10 years ago with gradually worsening course since that time. The patient noted no past surgery on the right knee.  Patient currently rates pain in the right knee at 8 out of 10 with activity. Patient has night pain, worsening of pain with activity and weight bearing, pain with passive range of motion, and crepitus. Patient has evidence of  near bone-on-bone arthritis central and lateral, with significant medial narrowing also  by imaging studies. There is no active infection.  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   Arthritis    Body mass index 31.0-31.9, adult    Cervical disc disease    Chronic back pain    Chronic neck pain    Dyspnea    Eosinophilic disorder    Family history of adverse reaction to anesthesia    mother had nausea   Graves' disease    H/O colonoscopy 12/2008   Hyperlipidemia    Hyperthyroidism    s/p RAI ablation   Hypothyroidism    Insomnia    Lumbar back pain    Osteoporosis    Persistent atrial fibrillation ([place]) 04/12/2019   Pruritus, unspecified    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     Past Surgical History:  Procedure Laterality Date   CARDIOVERSION N/A 09/01/2019   Procedure: CARDIOVERSION;  Surgeon: Nahser, Wonda Cheng, MD;  Location: [facility];   Service: Cardiovascular;  Laterality: N/A;   CARDIOVERSION N/A 10/27/2019   Procedure: CARDIOVERSION;  Surgeon: Lelon Perla, MD;  Location: [facility] ENDOSCOPY;  Service: Cardiovascular;  Laterality: N/A;   FISSURECTOMY     HAND SURGERY     KNEE ARTHROSCOPY  1999/2007   right knee   LIPOMA EXCISION     back   [place]      Prior to Admission medications   Medication Sig Start Date End Date Taking? Authorizing Provider  cetirizine (ZYRTEC) 10 MG tablet Take 10 mg by mouth daily as needed for allergies. 05/27/20  Yes [provider]  colchicine 0.6 MG tablet Take 1 tablet by mouth daily as needed (gout).   Yes [provider]  dofetilide (TIKOSYN) 125 MCG capsule TAKE 1 CAPSULE BY MOUTH TWICE DAILY ( TAKE WITH THE 250MCG CAPSULE TO EQUAL 375MCG TWICE DAILY ) 03/07/22  Yes Sherran Needs, NP  dofetilide (TIKOSYN) 250 MCG capsule Take 1 capsule (250 mcg total) by mouth 2 (two) times daily. Take with 122mcg dose to equal 345mcg twice a day 03/07/22  Yes Sherran Needs, NP  EPINEPHrine 0.3 mg/0.3 mL IJ SOAJ injection Inject 1 mL into the skin as needed for allergies. 04/02/20  Yes [provider]  levothyroxine (SYNTHROID) 175 MCG tablet Take 175 mcg by mouth See admin instructions. Take 175 mcg daily except skip dose  on Thursdays   Yes [provider]  Melatonin 10 MG TABS Take 10 mg by mouth at bedtime.   Yes [provider]  Oxycodone HCl 10 MG TABS Take 1 tablet by mouth 3 (three) times daily. 12/21/20  Yes [provider]  VEMLIDY 25 MG TABS Take 25 mg by mouth daily.  04/04/19  Yes [provider]  zolpidem (AMBIEN) 10 MG tablet Take 10 mg by mouth at bedtime.   Yes [provider]    Allergies  Allergen Reactions   Bee Venom Anaphylaxis and Swelling    Bee Sting-     Social History   Socioeconomic History   Marital status: Married    Spouse name: Beth   Number of children: Not on file    Years of education: Not on file   Highest education level: Not on file  Occupational History   Not on file  Tobacco Use   Smoking status: Never   Smokeless tobacco: Never  Substance and Sexual Activity   Alcohol use: Not Currently    Alcohol/week: 1.0 - 2.0 standard drink of alcohol    Types: 1 - 2 Cans of beer per week   Drug use: No   Sexual activity: Not Currently  Other Topics Concern   Not on file  Social History Narrative   Lives in [place] with spouse.   Self employed in [employer]   Social Determinants of Health   Financial Resource Strain: Not on file  Food Insecurity: Not on file  Transportation Needs: Not on file  Physical Activity: Not on file  Stress: Not on file  Social Connections: Not on file  Intimate Partner Violence: Not on file    Tobacco Use: Low Risk  (04/21/2022)   Patient History    Smoking Tobacco Use: Never    Smokeless Tobacco Use: Never    Passive Exposure: Not on file   Social History   Substance and Sexual Activity  Alcohol Use Not Currently   Alcohol/week: 1.0 - 2.0 standard drink of alcohol   Types: 1 - 2 Cans of beer per week    Family History  Problem Relation Age of Onset   Cancer Mother        skin   Atrial fibrillation Mother     Review of Systems  Constitutional:  Negative for chills and fever.  HENT:  Negative for congestion, sore throat and tinnitus.   Eyes:  Negative for double vision, photophobia and pain.  Respiratory:  Negative for cough, shortness of breath and wheezing.   Cardiovascular:  Negative for chest pain, palpitations and orthopnea.  Gastrointestinal:  Negative for heartburn, nausea and vomiting.  Genitourinary:  Negative for dysuria, frequency and urgency.  Musculoskeletal:  Positive for joint pain.  Neurological:  Negative for dizziness, weakness and headaches.    Objective:  Physical Exam: Well nourished and well developed.  General: Alert and oriented x3, cooperative and  pleasant, no acute distress.  Head: normocephalic, atraumatic, neck supple.  Eyes: EOMI.  Musculoskeletal:  Right Knee Exam: No effusion present. No swelling present. The range of motion is: 0 to 125 degrees. Moderate crepitus on range of motion of the knee. Positive medial joint line tenderness. Positive lateral joint line tenderness. The knee is stable.  Calves soft and nontender. Motor function intact in LE. Strength 5/5 LE bilaterally. Neuro: Distal pulses 2+. Sensation to light touch intact in LE.   Imaging Review Plain radiographs demonstrate severe degenerative joint disease of the  right knee. The overall alignment is neutral. The bone quality appears to be adequate for age and reported activity level.  Assessment/Plan:  End stage arthritis, right knee   The patient history, physical examination, clinical judgment of the provider and imaging studies are consistent with end stage degenerative joint disease of the right knee and total knee arthroplasty is deemed medically necessary. The treatment options including medical management, injection therapy arthroscopy and arthroplasty were discussed at length. The risks and benefits of total knee arthroplasty were presented and reviewed. The risks due to aseptic loosening, infection, stiffness, patella tracking problems, thromboembolic complications and other imponderables were discussed. The patient acknowledged the explanation, agreed to proceed with the plan and consent was signed. Patient is being admitted for inpatient treatment for surgery, pain control, PT, OT, prophylactic antibiotics, VTE prophylaxis, progressive ambulation and ADLs and discharge planning. The patient is planning to be discharged  home .   Patient's anticipated LOS is less than 2 midnights, meeting these requirements: - Younger than 73 - Lives within 1 hour of care - Has a competent adult at home to recover with post-op recover - NO history of  - Diabetes  -  Coronary Artery Disease  - Heart failure  - Heart attack  - Stroke  - Respiratory Failure/COPD  - Renal failure  - Anemia  - Advanced Liver disease  Therapy Plans: Outpatient therapy at [facility] Disposition: Home with wife Planned DVT Prophylaxis: Xarelto 10 mg QD DME Needed: None PCP: Benita Stabile, MD (clearance received) Cardiologist: Mertie Moores, MD (clearance from 10/16 visit) TXA: IV Allergies: NKDA Anesthesia Concerns: None BMI: 27.9 Last HgbA1c: Not diabetic.  Pharmacy: [facility])  Other: - Oxycodone 10 mg TID through Dr. Deforest Hoyles. Will not have any pain medications at home at time of surgery and Deforest Hoyles will not refill medication at time of surgery. Discussed switching over to dilaudid for 2-3 weeks following surgery, then releasing back to Dr. Deforest Hoyles for oxycodone.  - Hx atrial fibrillation, ablation 3 years ago  - Patient was instructed on what medications to stop prior to surgery. - Follow-up visit in 2 weeks with Dr. Wynelle Link - Begin physical therapy following surgery - Pre-operative lab work as pre-surgical testing - Prescriptions will be provided in hospital at time of discharge  Theresa Duty, PA-C Orthopedic Surgery [facility] [place]

## 2022-05-26 NOTE — Patient Instructions (Signed)
SURGICAL WAITING ROOM VISITATION Patients having surgery or a procedure may have no more than 2 support people in the waiting area - these visitors may rotate.   Children under the age of 61 must have an adult with them who is not the patient. If the patient needs to stay at the hospital during part of their recovery, the visitor guidelines for inpatient rooms apply. Pre-op nurse will coordinate an appropriate time for 1 support person to accompany patient in pre-op.  This support person may not rotate.    Please refer to the [facility] website for the visitor guidelines for Inpatients (after your surgery is over and you are in a regular room).       Your procedure is scheduled on:  06/02/22    Report to [facility] Main Entrance    Report to admitting at  1010 am    Call this number if you have problems the morning of surgery [phone]   Do not eat food :After Midnight.   After Midnight you may have the following liquids until __  0940____ AM DAY OF SURGERY  Water Non-Citrus Juices (without pulp, NO RED) Carbonated Beverages Black Coffee (NO MILK/CREAM OR CREAMERS, sugar ok)  Clear Tea (NO MILK/CREAM OR CREAMERS, sugar ok) regular and decaf                             Plain Jell-O (NO RED)                                           Fruit ices (not with fruit pulp, NO RED)                                     Popsicles (NO RED)                                                               Sports drinks like Gatorade (NO RED)        The day of surgery:  Drink ONE (1) Pre-Surgery Clear Ensure or G2 at  0940  AM ( have completed by )  the morning of surgery. Drink in one sitting. Do not sip.  This drink was given to you during your hospital  pre-op appointment visit. Nothing else to drink after completing the  Pre-Surgery Clear Ensure or G2.          If you have questions, please contact your surgeon's office.      Oral Hygiene is also important to reduce your  risk of infection.                                    Remember - BRUSH YOUR TEETH THE MORNING OF SURGERY WITH YOUR REGULAR TOOTHPASTE   Do NOT smoke after Midnight   Take these medicines the morning of surgery with A SIP OF WATER:  Tikosynn Synthroid   DO NOT TAKE ANY ORAL DIABETIC MEDICATIONS DAY OF YOUR SURGERY  Bring CPAP mask and tubing day of surgery.                              You may not have any metal on your body including hair pins, jewelry, and body piercing             Do not wear make-up, lotions, powders, perfumes/cologne, or deodorant  Do not wear nail polish including gel and S&S, artificial/acrylic nails, or any other type of covering on natural nails including finger and toenails. If you have artificial nails, gel coating, etc. that needs to be removed by a nail salon please have this removed prior to surgery or surgery may need to be canceled/ delayed if the surgeon/ anesthesia feels like they are unable to be safely monitored.   Do not shave  48 hours prior to surgery.               Men may shave face and neck.   Do not bring valuables to the hospital. [place].   Contacts, dentures or bridgework may not be worn into surgery.   Bring small overnight bag day of surgery.   DO NOT [place]. PHARMACY WILL DISPENSE MEDICATIONS LISTED ON YOUR MEDICATION LIST TO YOU DURING YOUR ADMISSION [place]!    Patients discharged on the day of surgery will not be allowed to drive home.  Someone NEEDS to stay with you for the first 24 hours after anesthesia.   Special Instructions: Bring a copy of your healthcare power of attorney and living will documents the day of surgery if you haven't scanned them before.              Please read over the following fact sheets you were given: IF [place] [phone]   If you received a COVID  test during your pre-op visit  it is requested that you wear a mask when out in public, stay away from anyone that may not be feeling well and notify your surgeon if you develop symptoms. If you test positive for Covid or have been in contact with anyone that has tested positive in the last 10 days please notify you surgeon.    [facility] - Preparing for Surgery Before surgery, you can play an important role.  Because skin is not sterile, your skin needs to be as free of germs as possible.  You can reduce the number of germs on your skin by washing with CHG (chlorahexidine gluconate) soap before surgery.  CHG is an antiseptic cleaner which kills germs and bonds with the skin to continue killing germs even after washing. Please DO NOT use if you have an allergy to CHG or antibacterial soaps.  If your skin becomes reddened/irritated stop using the CHG and inform your nurse when you arrive at Short Stay. Do not shave (including legs and underarms) for at least 48 hours prior to the first CHG shower.  You may shave your face/neck. Please follow these instructions carefully:  1.  Shower with CHG Soap the night before surgery and the  morning of Surgery.  2.  If you choose to wash your hair, wash your hair first as usual with your  normal  shampoo.  3.  After you shampoo, rinse  your hair and body thoroughly to remove the  shampoo.                           4.  Use CHG as you would any other liquid soap.  You can apply chg directly  to the skin and wash                       Gently with a scrungie or clean washcloth.  5.  Apply the CHG Soap to your body ONLY FROM THE NECK DOWN.   Do not use on face/ open                           Wound or open sores. Avoid contact with eyes, ears mouth and genitals (private parts).                       Wash face,  Genitals (private parts) with your normal soap.             6.  Wash thoroughly, paying special attention to the area where your surgery  will be performed.  7.   Thoroughly rinse your body with warm water from the neck down.  8.  DO NOT shower/wash with your normal soap after using and rinsing off  the CHG Soap.                9.  Pat yourself dry with a clean towel.            10.  Wear clean pajamas.            11.  Place clean sheets on your bed the night of your first shower and do not  sleep with pets. Day of Surgery : Do not apply any lotions/deodorants the morning of surgery.  Please wear clean clothes to the hospital/surgery center.  FAILURE TO FOLLOW THESE INSTRUCTIONS MAY RESULT IN THE CANCELLATION OF YOUR SURGERY PATIENT SIGNATURE_________________________________  NURSE SIGNATURE__________________________________  ________________________________________________________________________

## 2022-05-27 ENCOUNTER — Encounter (HOSPITAL_COMMUNITY): Payer: Self-pay

## 2022-05-27 ENCOUNTER — Encounter (HOSPITAL_COMMUNITY)
Admission: RE | Admit: 2022-05-27 | Discharge: 2022-05-27 | Disposition: A | Discharge status: Home / Self Care | Payer: BC Managed Care – PPO | Source: Ambulatory Visit | Attending: Orthopedic Surgery | Admitting: Orthopedic Surgery

## 2022-05-27 ENCOUNTER — Other Ambulatory Visit (HOSPITAL_COMMUNITY): Payer: Self-pay | Admitting: Nurse Practitioner

## 2022-05-27 ENCOUNTER — Other Ambulatory Visit: Payer: Self-pay

## 2022-05-27 VITALS — BP 124/85 | HR 69 | Temp 98.2°F | Resp 16 | Ht 72.0 in | Wt 198.0 lb

## 2022-05-27 DIAGNOSIS — Z79899 Other long term (current) drug therapy: Secondary | ICD-10-CM | POA: Insufficient documentation

## 2022-05-27 DIAGNOSIS — Z01812 Encounter for preprocedural laboratory examination: Secondary | ICD-10-CM | POA: Diagnosis not present

## 2022-05-27 DIAGNOSIS — M1711 Unilateral primary osteoarthritis, right knee: Secondary | ICD-10-CM | POA: Diagnosis not present

## 2022-05-27 DIAGNOSIS — G4730 Sleep apnea, unspecified: Secondary | ICD-10-CM | POA: Insufficient documentation

## 2022-05-27 DIAGNOSIS — I480 Paroxysmal atrial fibrillation: Secondary | ICD-10-CM

## 2022-05-27 DIAGNOSIS — Z01818 Encounter for other preprocedural examination: Secondary | ICD-10-CM

## 2022-05-27 DIAGNOSIS — I4891 Unspecified atrial fibrillation: Secondary | ICD-10-CM | POA: Insufficient documentation

## 2022-05-27 HISTORY — DX: Sleep apnea, unspecified: G47.30

## 2022-05-27 HISTORY — DX: Prediabetes: R73.03

## 2022-05-27 LAB — CBC
HCT: 44.1 % (ref 39.0–52.0)
Hemoglobin: 14.2 g/dL (ref 13.0–17.0)
MCH: 30.1 pg (ref 26.0–34.0)
MCHC: 32.2 g/dL (ref 30.0–36.0)
MCV: 93.6 fL (ref 80.0–100.0)
Platelets: 257 10*3/uL (ref 150–400)
RBC: 4.71 MIL/uL (ref 4.22–5.81)
RDW: 14.4 % (ref 11.5–15.5)
WBC: 6.5 10*3/uL (ref 4.0–10.5)
nRBC: 0 % (ref 0.0–0.2)

## 2022-05-27 LAB — HEMOGLOBIN A1C
Hgb A1c MFr Bld: 5.6 % (ref 4.8–5.6)
Mean Plasma Glucose: 114.02 mg/dL

## 2022-05-27 LAB — COMPREHENSIVE METABOLIC PANEL
ALT: 16 U/L (ref 0–44)
AST: 49 U/L — ABNORMAL HIGH (ref 15–41)
Albumin: 4 g/dL (ref 3.5–5.0)
Alkaline Phosphatase: 65 U/L (ref 38–126)
Anion gap: 9 (ref 5–15)
BUN: 12 mg/dL (ref 8–23)
CO2: 26 mmol/L (ref 22–32)
Calcium: 9.2 mg/dL (ref 8.9–10.3)
Chloride: 107 mmol/L (ref 98–111)
Creatinine, Ser: 0.84 mg/dL (ref 0.61–1.24)
GFR, Estimated: >60 mL/min (ref >60)
Glucose, Bld: 83 mg/dL (ref 70–99)
Potassium: 4.1 mmol/L (ref 3.5–5.1)
Sodium: 142 mmol/L (ref 135–145)
Total Bilirubin: 0.7 mg/dL (ref 0.3–1.2)
Total Protein: 7.5 g/dL (ref 6.5–8.1)

## 2022-05-27 LAB — SURGICAL PCR SCREEN
MRSA, PCR: NEGATIVE
Staphylococcus aureus: NEGATIVE

## 2022-05-28 ENCOUNTER — Telehealth: Payer: Self-pay | Admitting: Cardiovascular Disease

## 2022-05-28 NOTE — Progress Notes (Signed)
Anesthesia Chart Review   Case: [id number] Date/Time: 06/02/22 1225   Procedure: TOTAL KNEE ARTHROPLASTY (Right: Knee)   Anesthesia type: Choice   Pre-op diagnosis: right knee osteoarthritis   [facility] ROOM 10 / [facility]   Surgeons: Ollen Gross, MD       DISCUSSION:66 y.o. never smoker with h/o sleep apnea, atrial fibrillation, right knee OA scheduled for above procedure 06/02/2022 with Dr. Ollen Gross.   Pt last seen by cardiology 04/21/2022. Per OV note, "Atrial Fib: CHADS2VASC   =  0    .  Very stable on tikosyn.   Mag and potassium level   He is at low risk for his upcoming knee surgery."  Pt is not on anticoagulants.   Anticipate pt can proceed with planned procedure barring acute status change.   VS: BP 124/85   Pulse 69   Temp 36.8 C (Oral)   Resp 16   Ht 6' (1.829 m)   Wt 89.8 kg   SpO2 100%   BMI 26.85 kg/m   PROVIDERS: Georgann Housekeeper, MD is PCP   Nahser, Deloris Ping, MD is Cardiologist  LABS: Labs reviewed: Acceptable for surgery. (all labs ordered are listed, but only abnormal results are displayed)  Labs Reviewed  COMPREHENSIVE METABOLIC PANEL - Abnormal; Notable for the following components:      Result Value   AST 49 (*)    All other components within normal limits  SURGICAL PCR SCREEN  CBC  HEMOGLOBIN A1C     IMAGES:   EKG:   CV: Echo 04/15/2019 1. Left ventricular ejection fraction, by visual estimation, is 60 to  65%. The left ventricle has normal function. Normal left ventricular size.  There is no left ventricular hypertrophy.   2. Left ventricular diastolic function could not be evaluated pattern of  LV diastolic filling.   3. Global right ventricle has normal systolic function.The right  ventricular size is normal. No increase in right ventricular wall  thickness.   4. Left atrial size was normal.   5. Right atrial size was normal.   6. The mitral valve is normal in structure. Trace mitral valve  regurgitation. No evidence of  mitral stenosis.   7. The tricuspid valve is normal in structure. Tricuspid valve  regurgitation is trivial.   8. The aortic valve is tricuspid Aortic valve regurgitation is moderate  by color flow Doppler. Structurally normal aortic valve, with no evidence  of sclerosis or stenosis.   9. The pulmonic valve was grossly normal. Pulmonic valve regurgitation is  trivial by color flow Doppler.  10. Mildly elevated pulmonary artery systolic pressure.  11. The inferior vena cava is dilated in size with <50% respiratory  variability, suggesting right atrial pressure of 15 mmHg.  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   Arthritis    Body mass index 31.0-31.9, adult    Cervical disc disease    Chronic back pain    Chronic neck pain    Eosinophilic disorder    Family history of adverse reaction to anesthesia    mother had nausea   Graves' disease    H/O colonoscopy 12/2008   Heart murmur    hx of years ago not detectable now   Hyperlipidemia    Hyperthyroidism    s/p RAI ablation   Hypothyroidism    Insomnia    Lumbar back pain  Osteoporosis    Persistent atrial fibrillation ([facility]) 04/12/2019   Pre-diabetes    Pruritus, unspecified    Sleep apnea    oral device   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     Past Surgical History:  Procedure Laterality Date   CARDIOVERSION N/A 09/01/2019   Procedure: CARDIOVERSION;  Surgeon: Nahser, Deloris Ping, MD;  Location: [facility] ENDOSCOPY;  Service: Cardiovascular;  Laterality: N/A;   CARDIOVERSION N/A 10/27/2019   Procedure: CARDIOVERSION;  Surgeon: Lewayne Bunting, MD;  Location: [facility] ENDOSCOPY;  Service: Cardiovascular;  Laterality: N/A;   FISSURECTOMY     HAND SURGERY     KNEE ARTHROSCOPY  1999/2007   right knee   LIPOMA EXCISION     back   NECK SURGERY     SHOULDER SURGERY      MEDICATIONS:  cetirizine (ZYRTEC) 10 MG tablet    colchicine 0.6 MG tablet   dofetilide (TIKOSYN) 125 MCG capsule   dofetilide (TIKOSYN) 250 MCG capsule   EPINEPHrine 0.3 mg/0.3 mL IJ SOAJ injection   levothyroxine (SYNTHROID) 175 MCG tablet   Melatonin 10 MG TABS   Oxycodone HCl 10 MG TABS   VEMLIDY 25 MG TABS   zolpidem (AMBIEN) 10 MG tablet   No current facility-administered medications for this encounter.    Jodell Cipro Ward, PA-C [facility] Pre-Surgical Testing [phone]

## 2022-05-28 NOTE — Anesthesia Preprocedure Evaluation (Addendum)
Anesthesia Evaluation  Patient identified by MRN, date of birth, ID band Patient awake    Reviewed: Allergy & Precautions, NPO status , Patient's Chart, lab work & pertinent test results  Airway Mallampati: II  TM Distance: >3 FB Neck ROM: Full    Dental  (+) Dental Advisory Given   Pulmonary sleep apnea    breath sounds clear to auscultation       Cardiovascular + dysrhythmias Atrial Fibrillation  Rhythm:Regular Rate:Normal     Neuro/Psych negative neurological ROS     GI/Hepatic negative GI ROS, Neg liver ROS,,,  Endo/Other  Hypothyroidism    Renal/GU negative Renal ROS     Musculoskeletal  (+) Arthritis ,    Abdominal   Peds  Hematology negative hematology ROS (+)   Anesthesia Other Findings   Reproductive/Obstetrics                              Lab Results  Component Value Date   WBC 6.5 05/27/2022   HGB 14.2 05/27/2022   HCT 44.1 05/27/2022   MCV 93.6 05/27/2022   PLT 257 05/27/2022   Lab Results  Component Value Date   CREATININE 0.84 05/27/2022   BUN 12 05/27/2022   NA 142 05/27/2022   K 4.1 05/27/2022   CL 107 05/27/2022   CO2 26 05/27/2022    Anesthesia Physical Anesthesia Plan  ASA: 2  Anesthesia Plan: Spinal and MAC   Post-op Pain Management: Regional block* and Ofirmev IV (intra-op)*   Induction:   PONV Risk Score and Plan: 1 and Propofol infusion, Dexamethasone, Ondansetron and Treatment may vary due to age or medical condition  Airway Management Planned: Natural Airway and Simple Face Mask  Additional Equipment:   Intra-op Plan:   Post-operative Plan:   Informed Consent: I have reviewed the patients History and Physical, chart, labs and discussed the procedure including the risks, benefits and alternatives for the proposed anesthesia with the patient or authorized representative who has indicated his/her understanding and acceptance.        Plan Discussed with: CRNA  Anesthesia Plan Comments:         Anesthesia Quick Evaluation

## 2022-05-28 NOTE — Telephone Encounter (Signed)
Patient's wife is calling stating the patient's PCP is wanting the patient's next f/u due in April to be with Joshua Lowe instead of Dr. [name]. Patient has not seen EP since 2022. Last appt was with [name]. Please advise.

## 2022-05-28 NOTE — Telephone Encounter (Signed)
Left message for patient to call back.  According to Dr. Harvie Bridge last visit note:  Atrial Fib:  CHADS2VASC   =  0    .  Very stable on tikosyn.   Mag and potassium level   He is at low risk for his upcoming knee surgery .    He had lots of questions about needing Afib ablation . ? Changing dose of tikosyn   I told him I thought the current dose of Tikosyn seems to be working very well and that it would not change it.  He wanted more specific answers we will make him an ap

## 2022-06-02 ENCOUNTER — Encounter (HOSPITAL_COMMUNITY): Payer: Self-pay | Admitting: Orthopedic Surgery

## 2022-06-02 ENCOUNTER — Other Ambulatory Visit: Payer: Self-pay

## 2022-06-02 ENCOUNTER — Observation Stay (HOSPITAL_COMMUNITY)
Admission: RE | Admit: 2022-06-02 | Discharge: 2022-06-03 | Disposition: A | Discharge status: Home / Self Care | Payer: BC Managed Care – PPO | Attending: Orthopedic Surgery | Admitting: Orthopedic Surgery

## 2022-06-02 ENCOUNTER — Ambulatory Visit (HOSPITAL_COMMUNITY): Payer: BC Managed Care – PPO | Admitting: Physician Assistant

## 2022-06-02 ENCOUNTER — Encounter (HOSPITAL_COMMUNITY)
Admission: RE | Disposition: A | Discharge status: Home / Self Care | Payer: Self-pay | Source: Home / Self Care | Attending: Orthopedic Surgery

## 2022-06-02 DIAGNOSIS — Z79899 Other long term (current) drug therapy: Secondary | ICD-10-CM | POA: Diagnosis not present

## 2022-06-02 DIAGNOSIS — Z01818 Encounter for other preprocedural examination: Secondary | ICD-10-CM

## 2022-06-02 DIAGNOSIS — M1711 Unilateral primary osteoarthritis, right knee: Principal | ICD-10-CM

## 2022-06-02 DIAGNOSIS — E039 Hypothyroidism, unspecified: Secondary | ICD-10-CM | POA: Insufficient documentation

## 2022-06-02 DIAGNOSIS — G8918 Other acute postprocedural pain: Secondary | ICD-10-CM | POA: Diagnosis not present

## 2022-06-02 DIAGNOSIS — I4819 Other persistent atrial fibrillation: Secondary | ICD-10-CM | POA: Diagnosis not present

## 2022-06-02 DIAGNOSIS — M179 Osteoarthritis of knee, unspecified: Secondary | ICD-10-CM

## 2022-06-02 HISTORY — PX: TOTAL KNEE ARTHROPLASTY: SHX125

## 2022-06-02 LAB — BASIC METABOLIC PANEL
Anion gap: 9 (ref 5–15)
BUN: 9 mg/dL (ref 8–23)
CO2: 24 mmol/L (ref 22–32)
Calcium: 8.6 mg/dL — ABNORMAL LOW (ref 8.9–10.3)
Chloride: 104 mmol/L (ref 98–111)
Creatinine, Ser: 0.87 mg/dL (ref 0.61–1.24)
GFR, Estimated: >60 mL/min (ref >60)
Glucose, Bld: 205 mg/dL — ABNORMAL HIGH (ref 70–99)
Potassium: 3.8 mmol/L (ref 3.5–5.1)
Sodium: 137 mmol/L (ref 135–145)

## 2022-06-02 SURGERY — ARTHROPLASTY, KNEE, TOTAL
Anesthesia: Monitor Anesthesia Care | Site: Knee | Laterality: Right

## 2022-06-02 MED ORDER — LEVOTHYROXINE SODIUM 175 MCG PO TABS
175.0000 ug | ORAL_TABLET | ORAL | Status: DC
  Administered 2022-06-03: 175 ug via ORAL

## 2022-06-02 MED ORDER — ORAL CARE MOUTH RINSE: 15.0000 mL | Freq: Once | OROMUCOSAL | Status: AC

## 2022-06-02 MED ORDER — BISACODYL 10 MG RE SUPP: 10.0000 mg | Freq: Every day | RECTAL | Status: DC | PRN

## 2022-06-02 MED ORDER — POVIDONE-IODINE 10 % EX SWAB
2.0000 | Freq: Once | CUTANEOUS | Status: AC
Start: 2022-06-02 — End: 2022-06-02
  Administered 2022-06-02: 2 via TOPICAL

## 2022-06-02 MED ORDER — DIPHENHYDRAMINE HCL 12.5 MG/5ML PO ELIX: 12.5000 mg | ORAL_SOLUTION | ORAL | Status: DC | PRN

## 2022-06-02 MED ORDER — ONDANSETRON HCL 4 MG/2ML IJ SOLN
INTRAMUSCULAR | Status: DC | PRN
  Administered 2022-06-02: 4 mg via INTRAVENOUS

## 2022-06-02 MED ORDER — TENOFOVIR ALAFENAMIDE FUMARATE 25 MG PO TABS
25.0000 mg | ORAL_TABLET | Freq: Every day | ORAL | Status: DC
  Administered 2022-06-03: 25 mg via ORAL
  Filled 2022-06-02: qty 1

## 2022-06-02 MED ORDER — METOCLOPRAMIDE HCL 5 MG PO TABS: 5.0000 mg | ORAL_TABLET | Freq: Three times a day (TID) | ORAL | Status: DC | PRN

## 2022-06-02 MED ORDER — METHOCARBAMOL 500 MG PO TABS
500.0000 mg | ORAL_TABLET | Freq: Four times a day (QID) | ORAL | Status: DC | PRN
  Administered 2022-06-02 – 2022-06-03 (×3): 500 mg via ORAL
  Filled 2022-06-02 (×3): qty 1

## 2022-06-02 MED ORDER — BUPIVACAINE LIPOSOME 1.3 % IJ SUSP: 20.0000 mL | Freq: Once | INTRAMUSCULAR | Status: DC

## 2022-06-02 MED ORDER — CLONIDINE HCL (ANALGESIA) 100 MCG/ML EP SOLN
EPIDURAL | Status: DC | PRN
  Administered 2022-06-02: 50 ug

## 2022-06-02 MED ORDER — PHENOL 1.4 % MT LIQD: 1.0000 | OROMUCOSAL | Status: DC | PRN

## 2022-06-02 MED ORDER — HYDROMORPHONE HCL 2 MG PO TABS
2.0000 mg | ORAL_TABLET | ORAL | Status: DC | PRN
  Administered 2022-06-02: 2 mg via ORAL
  Administered 2022-06-03 (×3): 4 mg via ORAL
  Filled 2022-06-02 (×2): qty 2
  Filled 2022-06-02: qty 1
  Filled 2022-06-02: qty 2

## 2022-06-02 MED ORDER — METOCLOPRAMIDE HCL 5 MG/ML IJ SOLN: 5.0000 mg | Freq: Three times a day (TID) | INTRAMUSCULAR | Status: DC | PRN

## 2022-06-02 MED ORDER — DEXAMETHASONE SODIUM PHOSPHATE 10 MG/ML IJ SOLN
8.0000 mg | Freq: Once | INTRAMUSCULAR | Status: AC
Start: 2022-06-02 — End: 2022-06-02
  Administered 2022-06-02: 8 mg via INTRAVENOUS

## 2022-06-02 MED ORDER — CEFAZOLIN SODIUM-DEXTROSE 2-4 GM/100ML-% IV SOLN
2.0000 g | Freq: Four times a day (QID) | INTRAVENOUS | Status: AC
  Administered 2022-06-02 – 2022-06-03 (×2): 2 g via INTRAVENOUS
  Filled 2022-06-02 (×2): qty 100

## 2022-06-02 MED ORDER — ACETAMINOPHEN 10 MG/ML IV SOLN
1000.0000 mg | Freq: Four times a day (QID) | INTRAVENOUS | Status: DC
  Administered 2022-06-02: 1000 mg via INTRAVENOUS
  Filled 2022-06-02: qty 100

## 2022-06-02 MED ORDER — AMISULPRIDE (ANTIEMETIC) 5 MG/2ML IV SOLN: 10.0000 mg | Freq: Once | INTRAVENOUS | Status: DC | PRN

## 2022-06-02 MED ORDER — ACETAMINOPHEN 500 MG PO TABS
1000.0000 mg | ORAL_TABLET | Freq: Four times a day (QID) | ORAL | Status: DC
  Filled 2022-06-02 (×2): qty 2

## 2022-06-02 MED ORDER — TRANEXAMIC ACID-NACL 1000-0.7 MG/100ML-% IV SOLN
1000.0000 mg | INTRAVENOUS | Status: AC
  Administered 2022-06-02: 1000 mg via INTRAVENOUS
  Filled 2022-06-02: qty 100

## 2022-06-02 MED ORDER — CHLORHEXIDINE GLUCONATE 0.12 % MT SOLN
15.0000 mL | Freq: Once | OROMUCOSAL | Status: AC
  Administered 2022-06-02: 15 mL via OROMUCOSAL

## 2022-06-02 MED ORDER — DOCUSATE SODIUM 100 MG PO CAPS
100.0000 mg | ORAL_CAPSULE | Freq: Two times a day (BID) | ORAL | Status: DC
  Administered 2022-06-02 – 2022-06-03 (×2): 100 mg via ORAL
  Filled 2022-06-02 (×2): qty 1

## 2022-06-02 MED ORDER — LEVOTHYROXINE SODIUM 50 MCG PO TABS: 175.0000 ug | ORAL_TABLET | ORAL | Status: DC

## 2022-06-02 MED ORDER — MENTHOL 3 MG MT LOZG: 1.0000 | LOZENGE | OROMUCOSAL | Status: DC | PRN

## 2022-06-02 MED ORDER — PROPOFOL 10 MG/ML IV BOLUS
INTRAVENOUS | Status: AC
  Filled 2022-06-02: qty 20

## 2022-06-02 MED ORDER — BUPIVACAINE IN DEXTROSE 0.75-8.25 % IT SOLN
INTRATHECAL | Status: DC | PRN
  Administered 2022-06-02: 1.6 mL via INTRATHECAL

## 2022-06-02 MED ORDER — MIDAZOLAM HCL 2 MG/2ML IJ SOLN
1.0000 mg | INTRAMUSCULAR | Status: DC
  Administered 2022-06-02: 1 mg via INTRAVENOUS
  Filled 2022-06-02: qty 2

## 2022-06-02 MED ORDER — SODIUM CHLORIDE 0.9 % IR SOLN
Status: DC | PRN
  Administered 2022-06-02 (×2): 1000 mL

## 2022-06-02 MED ORDER — COLCHICINE 0.6 MG PO TABS: 0.6000 mg | ORAL_TABLET | Freq: Every day | ORAL | Status: DC | PRN

## 2022-06-02 MED ORDER — ONDANSETRON HCL 4 MG/2ML IJ SOLN
INTRAMUSCULAR | Status: AC
  Filled 2022-06-02: qty 2

## 2022-06-02 MED ORDER — CEFAZOLIN SODIUM-DEXTROSE 2-4 GM/100ML-% IV SOLN
2.0000 g | INTRAVENOUS | Status: AC
  Administered 2022-06-02: 2 g via INTRAVENOUS
  Filled 2022-06-02: qty 100

## 2022-06-02 MED ORDER — PROPOFOL 500 MG/50ML IV EMUL
INTRAVENOUS | Status: DC | PRN
  Administered 2022-06-02: 75 ug/kg/min via INTRAVENOUS

## 2022-06-02 MED ORDER — ONDANSETRON HCL 4 MG/2ML IJ SOLN: 4.0000 mg | Freq: Four times a day (QID) | INTRAMUSCULAR | Status: DC | PRN

## 2022-06-02 MED ORDER — LACTATED RINGERS IV SOLN: INTRAVENOUS | Status: DC

## 2022-06-02 MED ORDER — SODIUM CHLORIDE 0.9 % IV SOLN: INTRAVENOUS | Status: DC

## 2022-06-02 MED ORDER — ROPIVACAINE HCL 5 MG/ML IJ SOLN
INTRAMUSCULAR | Status: DC | PRN
  Administered 2022-06-02: 20 mL via PERINEURAL

## 2022-06-02 MED ORDER — DOFETILIDE 125 MCG PO CAPS
125.0000 ug | ORAL_CAPSULE | Freq: Two times a day (BID) | ORAL | Status: DC
  Administered 2022-06-02 – 2022-06-03 (×2): 125 ug via ORAL
  Filled 2022-06-02 (×3): qty 1

## 2022-06-02 MED ORDER — FENTANYL CITRATE PF 50 MCG/ML IJ SOSY
50.0000 ug | PREFILLED_SYRINGE | INTRAMUSCULAR | Status: DC
  Administered 2022-06-02: 50 ug via INTRAVENOUS
  Filled 2022-06-02: qty 2

## 2022-06-02 MED ORDER — POLYETHYLENE GLYCOL 3350 17 G PO PACK: 17.0000 g | PACK | Freq: Every day | ORAL | Status: DC | PRN

## 2022-06-02 MED ORDER — DOFETILIDE 250 MCG PO CAPS
250.0000 ug | ORAL_CAPSULE | Freq: Two times a day (BID) | ORAL | Status: DC
  Administered 2022-06-03: 250 ug via ORAL

## 2022-06-02 MED ORDER — RIVAROXABAN 10 MG PO TABS
10.0000 mg | ORAL_TABLET | Freq: Every day | ORAL | Status: DC
  Administered 2022-06-03: 10 mg via ORAL
  Filled 2022-06-02: qty 1

## 2022-06-02 MED ORDER — HYDROMORPHONE HCL 1 MG/ML IJ SOLN
0.5000 mg | INTRAMUSCULAR | Status: DC | PRN
  Administered 2022-06-03: 1 mg via INTRAVENOUS
  Filled 2022-06-02: qty 1

## 2022-06-02 MED ORDER — SODIUM CHLORIDE 0.9 % IV SOLN
INTRAVENOUS | Status: DC | PRN
  Administered 2022-06-02: 80 mL

## 2022-06-02 MED ORDER — TRAMADOL HCL 50 MG PO TABS
50.0000 mg | ORAL_TABLET | Freq: Four times a day (QID) | ORAL | Status: DC | PRN
  Administered 2022-06-02 – 2022-06-03 (×2): 100 mg via ORAL
  Filled 2022-06-02 (×2): qty 2

## 2022-06-02 MED ORDER — LORATADINE 10 MG PO TABS
10.0000 mg | ORAL_TABLET | Freq: Every day | ORAL | Status: DC
  Filled 2022-06-02: qty 1

## 2022-06-02 MED ORDER — PHENYLEPHRINE HCL-NACL 20-0.9 MG/250ML-% IV SOLN
INTRAVENOUS | Status: DC | PRN
  Administered 2022-06-02: 20 ug/min via INTRAVENOUS

## 2022-06-02 MED ORDER — BUPIVACAINE LIPOSOME 1.3 % IJ SUSP
INTRAMUSCULAR | Status: AC
  Filled 2022-06-02: qty 20

## 2022-06-02 MED ORDER — PROPOFOL 10 MG/ML IV BOLUS
INTRAVENOUS | Status: DC | PRN
  Administered 2022-06-02: 40 mg via INTRAVENOUS
  Administered 2022-06-02 (×6): 20 mg via INTRAVENOUS

## 2022-06-02 MED ORDER — SODIUM CHLORIDE (PF) 0.9 % IJ SOLN
INTRAMUSCULAR | Status: AC
  Filled 2022-06-02: qty 10

## 2022-06-02 MED ORDER — ONDANSETRON HCL 4 MG PO TABS: 4.0000 mg | ORAL_TABLET | Freq: Four times a day (QID) | ORAL | Status: DC | PRN

## 2022-06-02 MED ORDER — FENTANYL CITRATE PF 50 MCG/ML IJ SOSY: 25.0000 ug | PREFILLED_SYRINGE | INTRAMUSCULAR | Status: DC | PRN

## 2022-06-02 MED ORDER — DEXAMETHASONE SODIUM PHOSPHATE 10 MG/ML IJ SOLN
INTRAMUSCULAR | Status: AC
  Filled 2022-06-02: qty 1

## 2022-06-02 MED ORDER — PROPOFOL 1000 MG/100ML IV EMUL
INTRAVENOUS | Status: AC
  Filled 2022-06-02: qty 100

## 2022-06-02 MED ORDER — METHOCARBAMOL 500 MG IVPB - SIMPLE MED: 500.0000 mg | Freq: Four times a day (QID) | INTRAVENOUS | Status: DC | PRN

## 2022-06-02 MED ORDER — DEXAMETHASONE SODIUM PHOSPHATE 10 MG/ML IJ SOLN
10.0000 mg | Freq: Once | INTRAMUSCULAR | Status: AC
  Administered 2022-06-03: 10 mg via INTRAVENOUS
  Filled 2022-06-02: qty 1

## 2022-06-02 MED ORDER — GABAPENTIN 300 MG PO CAPS
300.0000 mg | ORAL_CAPSULE | Freq: Three times a day (TID) | ORAL | Status: DC
  Administered 2022-06-02 – 2022-06-03 (×2): 300 mg via ORAL
  Filled 2022-06-02 (×2): qty 1

## 2022-06-02 MED ORDER — FLEET ENEMA 7-19 GM/118ML RE ENEM: 1.0000 | ENEMA | Freq: Once | RECTAL | Status: DC | PRN

## 2022-06-02 MED ORDER — SODIUM CHLORIDE (PF) 0.9 % IJ SOLN
INTRAMUSCULAR | Status: AC
  Filled 2022-06-02: qty 50

## 2022-06-02 SURGICAL SUPPLY — 56 items
ATTUNE MED DOME PAT 41 KNEE (Knees) IMPLANT
ATTUNE PS FEM RT SZ 8 CEM KNEE (Femur) IMPLANT
ATTUNE PSRP INSR SZ8 7 KNEE (Insert) IMPLANT
BAG COUNTER SPONGE SURGICOUNT (BAG) IMPLANT
BAG SPEC THK2 15X12 ZIP CLS (MISCELLANEOUS) ×1
BAG SPNG CNTER NS LX DISP (BAG)
BAG ZIPLOCK 12X15 (MISCELLANEOUS) ×1 IMPLANT
BASE TIBIAL ROT PLAT SZ 8 KNEE (Knees) IMPLANT
BLADE SAG 18X100X1.27 (BLADE) ×1 IMPLANT
BLADE SAW SGTL 11.0X1.19X90.0M (BLADE) ×1 IMPLANT
BNDG ELASTIC 6X5.8 VLCR STR LF (GAUZE/BANDAGES/DRESSINGS) ×1 IMPLANT
BOWL SMART MIX CTS (DISPOSABLE) ×1 IMPLANT
CEMENT HV SMART SET (Cement) ×2 IMPLANT
COVER SURGICAL LIGHT HANDLE (MISCELLANEOUS) ×1 IMPLANT
CUFF TOURN SGL QUICK 34 (TOURNIQUET CUFF) ×1
CUFF TRNQT CYL 34X4.125X (TOURNIQUET CUFF) ×1 IMPLANT
DRAPE INCISE IOBAN 66X45 STRL (DRAPES) ×1 IMPLANT
DRAPE U-SHAPE 47X51 STRL (DRAPES) ×1 IMPLANT
DRSG AQUACEL AG ADV 3.5X10 (GAUZE/BANDAGES/DRESSINGS) ×1 IMPLANT
DURAPREP 26ML APPLICATOR (WOUND CARE) ×1 IMPLANT
ELECT REM PT RETURN 15FT ADLT (MISCELLANEOUS) ×1 IMPLANT
GLOVE BIO SURGEON STRL SZ 6.5 (GLOVE) IMPLANT
GLOVE BIO SURGEON STRL SZ7.5 (GLOVE) IMPLANT
GLOVE BIO SURGEON STRL SZ8 (GLOVE) ×1 IMPLANT
GLOVE BIOGEL PI IND STRL 6.5 (GLOVE) IMPLANT
GLOVE BIOGEL PI IND STRL 7.0 (GLOVE) IMPLANT
GLOVE BIOGEL PI IND STRL 8 (GLOVE) ×1 IMPLANT
GOWN STRL REUS W/ TWL LRG LVL3 (GOWN DISPOSABLE) ×1 IMPLANT
GOWN STRL REUS W/ TWL XL LVL3 (GOWN DISPOSABLE) IMPLANT
GOWN STRL REUS W/TWL LRG LVL3 (GOWN DISPOSABLE) ×1
GOWN STRL REUS W/TWL XL LVL3 (GOWN DISPOSABLE)
HANDPIECE INTERPULSE COAX TIP (DISPOSABLE) ×1
HOLDER FOLEY CATH W/STRAP (MISCELLANEOUS) IMPLANT
IMMOBILIZER KNEE 20 (SOFTGOODS) ×1
IMMOBILIZER KNEE 20 THIGH 36 (SOFTGOODS) ×1 IMPLANT
KIT TURNOVER KIT A (KITS) IMPLANT
MANIFOLD NEPTUNE II (INSTRUMENTS) ×1 IMPLANT
NS IRRIG 1000ML POUR BTL (IV SOLUTION) ×1 IMPLANT
PACK TOTAL KNEE CUSTOM (KITS) ×1 IMPLANT
PADDING CAST ABS COTTON 6X4 NS (CAST SUPPLIES) IMPLANT
PADDING CAST COTTON 6X4 STRL (CAST SUPPLIES) ×2 IMPLANT
PIN STEINMAN FIXATION KNEE (PIN) IMPLANT
PROTECTOR NERVE ULNAR (MISCELLANEOUS) ×1 IMPLANT
SET HNDPC FAN SPRY TIP SCT (DISPOSABLE) ×1 IMPLANT
SPIKE FLUID TRANSFER (MISCELLANEOUS) ×1 IMPLANT
STRIP CLOSURE SKIN 1/2X4 (GAUZE/BANDAGES/DRESSINGS) ×2 IMPLANT
SUT MNCRL AB 4-0 PS2 18 (SUTURE) ×1 IMPLANT
SUT STRATAFIX 0 PDS 27 VIOLET (SUTURE) ×1
SUT VIC AB 2-0 CT1 27 (SUTURE) ×3
SUT VIC AB 2-0 CT1 TAPERPNT 27 (SUTURE) ×3 IMPLANT
SUTURE STRATFX 0 PDS 27 VIOLET (SUTURE) ×1 IMPLANT
TIBIAL BASE ROT PLAT SZ 8 KNEE (Knees) ×1 IMPLANT
TRAY FOLEY MTR SLVR 16FR STAT (SET/KITS/TRAYS/PACK) ×1 IMPLANT
TUBE SUCTION HIGH CAP CLEAR NV (SUCTIONS) ×1 IMPLANT
WATER STERILE IRR 1000ML POUR (IV SOLUTION) ×2 IMPLANT
WRAP KNEE MAXI GEL POST OP (GAUZE/BANDAGES/DRESSINGS) ×1 IMPLANT

## 2022-06-02 NOTE — Plan of Care (Signed)
  Problem: Activity: Goal: Ability to avoid complications of mobility impairment will improve Outcome: Progressing Goal: Range of joint motion will improve Outcome: Progressing   Problem: Pain Management: Goal: Pain level will decrease with appropriate interventions Outcome: Progressing   

## 2022-06-02 NOTE — Anesthesia Procedure Notes (Signed)
Anesthesia Regional Block: Adductor canal block   Pre-Anesthetic Checklist: , timeout performed,  Correct Patient, Correct Site, Correct Laterality,  Correct Procedure, Correct Position, site marked,  Risks and benefits discussed,  Surgical consent,  Pre-op evaluation,  At surgeon's request and post-op pain management  Laterality: Right  Prep: chloraprep       Needles:  Injection technique: Single-shot  Needle Type: Echogenic Needle     Needle Length: 9cm  Needle Gauge: 21     Additional Needles:   Procedures:,,,, ultrasound used (permanent image in chart),,    Narrative:  Start time: 06/02/2022 11:58 AM End time: 06/02/2022 12:03 PM Injection made incrementally with aspirations every 5 mL.  Performed by: Personally  Anesthesiologist: Marcene Duos, MD

## 2022-06-02 NOTE — Telephone Encounter (Signed)
Spouse returning nurse's call. Please advise

## 2022-06-02 NOTE — Transfer of Care (Signed)
Immediate Anesthesia Transfer of Care Note  Patient: Joshua Lowe  Procedure(s) Performed: TOTAL KNEE ARTHROPLASTY (Right: Knee)  Patient Location: PACU  Anesthesia Type:Spinal  Level of Consciousness: drowsy and patient cooperative  Airway & Oxygen Therapy: Patient Spontanous Breathing and Patient connected to face mask oxygen  Post-op Assessment: Report given to RN and Post -op Vital signs reviewed and stable  Post vital signs: Reviewed and stable  Last Vitals:  Vitals Value Taken Time  BP 101/62 06/02/22 1416  Temp    Pulse 57 06/02/22 1417  Resp 20 06/02/22 1417  SpO2 100 % 06/02/22 1417  Vitals shown include unvalidated device data.  Last Pain:  Vitals:   06/02/22 1215  TempSrc:   PainSc: 0-No pain         Complications: No notable events documented.

## 2022-06-02 NOTE — Progress Notes (Signed)
Orthopedic Tech Progress Note Patient Details:  Joshua Lowe Sep 21, 1955 [id number]  CPM Right Knee CPM Right Knee: On Right Knee Flexion (Degrees): 40 Right Knee Extension (Degrees): 10  Post Interventions Patient Tolerated: Well  Genelle Bal Joshua Lowe 06/02/2022, 2:24 PM

## 2022-06-02 NOTE — Interval H&P Note (Signed)
History and Physical Interval Note:  06/02/2022 10:40 AM  Joshua Lowe  has presented today for surgery, with the diagnosis of right knee osteoarthritis.  The various methods of treatment have been discussed with the patient and family. After consideration of risks, benefits and other options for treatment, the patient has consented to  Procedure(s): TOTAL KNEE ARTHROPLASTY (Right) as a surgical intervention.  The patient's history has been reviewed, patient examined, no change in status, stable for surgery.  I have reviewed the patient's chart and labs.  Questions were answered to the patient's satisfaction.     Homero Fellers Leila Schuff

## 2022-06-02 NOTE — Discharge Instructions (Addendum)
 Frank Aluisio, MD Total Joint Specialist [facility] [place] [address] [place], [place] [address] [phone]  TOTAL KNEE REPLACEMENT POSTOPERATIVE DIRECTIONS    Knee Rehabilitation, Guidelines Following Surgery  Results after knee surgery are often greatly improved when you follow the exercise, range of motion and muscle strengthening exercises prescribed by your doctor. Safety measures are also important to protect the knee from further injury. If any of these exercises cause you to have increased pain or swelling in your knee joint, decrease the amount until you are comfortable again and slowly increase them. If you have problems or questions, call your caregiver or physical therapist for advice.   BLOOD CLOT PREVENTION Take a 10 mg Xarelto once a day for three weeks following surgery. Then take an 81 mg Aspirin once a day for three weeks. Then discontinue Aspirin. You may resume your vitamins/supplements once you have discontinued the Xarelto. Do not take any NSAIDs (Advil, Aleve, Ibuprofen, Meloxicam, etc.) until you have discontinued the Xarelto.   HOME CARE INSTRUCTIONS  Remove items at home which could result in a fall. This includes throw rugs or furniture in walking pathways.  ICE to the affected knee as much as tolerated. Icing helps control swelling. If the swelling is well controlled you will be more comfortable and rehab easier. Continue to use ice on the knee for pain and swelling from surgery. You may notice swelling that will progress down to the foot and ankle. This is normal after surgery. Elevate the leg when you are not up walking on it.    Continue to use the breathing machine which will help keep your temperature down. It is common for your temperature to cycle up and down following surgery, especially at night when you are not up moving around and exerting yourself. The breathing machine keeps your lungs expanded and your temperature  down. Do not place pillow under the operative knee, focus on keeping the knee straight while resting  DIET You may resume your previous home diet once you are discharged from the hospital.  DRESSING / WOUND CARE / SHOWERING Keep your bulky bandage on for 2 days. On the third post-operative day you may remove the Ace bandage and gauze. There is a waterproof adhesive bandage on your skin which will stay in place until your first follow-up appointment. Once you remove this you will not need to place another bandage You may begin showering 3 days following surgery, but do not submerge the incision under water.  ACTIVITY For the first 5 days, the key is rest and control of pain and swelling Do your home exercises twice a day starting on post-operative day 3. On the days you go to physical therapy, just do the home exercises once that day. You should rest, ice and elevate the leg for 50 minutes out of every hour. Get up and walk/stretch for 10 minutes per hour. After 5 days you can increase your activity slowly as tolerated. Walk with your walker as instructed. Use the walker until you are comfortable transitioning to a cane. Walk with the cane in the opposite hand of the operative leg. You may discontinue the cane once you are comfortable and walking steadily. Avoid periods of inactivity such as sitting longer than an hour when not asleep. This helps prevent blood clots.  You may discontinue the knee immobilizer once you are able to perform a straight leg raise while lying down. You may resume a sexual relationship in one month or   when given the OK by your doctor.  You may return to work once you are cleared by your doctor.  Do not drive a car for 6 weeks or until released by your surgeon.  Do not drive while taking narcotics.  TED HOSE STOCKINGS Wear the elastic stockings on both legs for three weeks following surgery during the day. You may remove them at night for sleeping.  WEIGHT  BEARING Weight bearing as tolerated with assist device (walker, cane, etc) as directed, use it as long as suggested by your surgeon or therapist, typically at least 4-6 weeks.  POSTOPERATIVE CONSTIPATION PROTOCOL Constipation - defined medically as fewer than three stools per week and severe constipation as less than one stool per week.  One of the most common issues patients have following surgery is constipation.  Even if you have a regular bowel pattern at home, your normal regimen is likely to be disrupted due to multiple reasons following surgery.  Combination of anesthesia, postoperative narcotics, change in appetite and fluid intake all can affect your bowels.  In order to avoid complications following surgery, here are some recommendations in order to help you during your recovery period.  Colace (docusate) - Pick up an over-the-counter form of Colace or another stool softener and take twice a day as long as you are requiring postoperative pain medications.  Take with a full glass of water daily.  If you experience loose stools or diarrhea, hold the colace until you stool forms back up. If your symptoms do not get better within 1 week or if they get worse, check with your doctor. Dulcolax (bisacodyl) - Pick up over-the-counter and take as directed by the product packaging as needed to assist with the movement of your bowels.  Take with a full glass of water.  Use this product as needed if not relieved by Colace only.  MiraLax (polyethylene glycol) - Pick up over-the-counter to have on hand. MiraLax is a solution that will increase the amount of water in your bowels to assist with bowel movements.  Take as directed and can mix with a glass of water, juice, soda, coffee, or tea. Take if you go more than two days without a movement. Do not use MiraLax more than once per day. Call your doctor if you are still constipated or irregular after using this medication for 7 days in a row.  If you continue  to have problems with postoperative constipation, please contact the office for further assistance and recommendations.  If you experience "the worst abdominal pain ever" or develop nausea or vomiting, please contact the office immediatly for further recommendations for treatment.  ITCHING If you experience itching with your medications, try taking only a single pain pill, or even half a pain pill at a time.  You can also use Benadryl over the counter for itching or also to help with sleep.   MEDICATIONS See your medication summary on the "After Visit Summary" that the nursing staff will review with you prior to discharge.  You may have some home medications which will be placed on hold until you complete the course of blood thinner medication.  It is important for you to complete the blood thinner medication as prescribed by your surgeon.  Continue your approved medications as instructed at time of discharge.  PRECAUTIONS If you experience chest pain or shortness of breath - call 911 immediately for transfer to the hospital emergency department.  If you develop a fever greater that 101 F, purulent   drainage from wound, increased redness or drainage from wound, foul odor from the wound/dressing, or calf pain - CONTACT YOUR SURGEON.                                                   FOLLOW-UP APPOINTMENTS Make sure you keep all of your appointments after your operation with your surgeon and caregivers. You should call the office at the above phone number and make an appointment for approximately two weeks after the date of your surgery or on the date instructed by your surgeon outlined in the "After Visit Summary".  RANGE OF MOTION AND STRENGTHENING EXERCISES  Rehabilitation of the knee is important following a knee injury or an operation. After just a few days of immobilization, the muscles of the thigh which control the knee become weakened and shrink (atrophy). Knee exercises are designed to build up  the tone and strength of the thigh muscles and to improve knee motion. Often times heat used for twenty to thirty minutes before working out will loosen up your tissues and help with improving the range of motion but do not use heat for the first two weeks following surgery. These exercises can be done on a training (exercise) mat, on the floor, on a table or on a bed. Use what ever works the best and is most comfortable for you Knee exercises include:  Leg Lifts - While your knee is still immobilized in a splint or cast, you can do straight leg raises. Lift the leg to 60 degrees, hold for 3 sec, and slowly lower the leg. Repeat 10-20 times 2-3 times daily. Perform this exercise against resistance later as your knee gets better.  Quad and Hamstring Sets - Tighten up the muscle on the front of the thigh (Quad) and hold for 5-10 sec. Repeat this 10-20 times hourly. Hamstring sets are done by pushing the foot backward against an object and holding for 5-10 sec. Repeat as with quad sets.  Leg Slides: Lying on your back, slowly slide your foot toward your buttocks, bending your knee up off the floor (only go as far as is comfortable). Then slowly slide your foot back down until your leg is flat on the floor again. Angel Wings: Lying on your back spread your legs to the side as far apart as you can without causing discomfort.  A rehabilitation program following serious knee injuries can speed recovery and prevent re-injury in the future due to weakened muscles. Contact your doctor or a physical therapist for more information on knee rehabilitation.   POST-OPERATIVE OPIOID TAPER INSTRUCTIONS: It is important to wean off of your opioid medication as soon as possible. If you do not need pain medication after your surgery it is ok to stop day one. Opioids include: Codeine, Hydrocodone(Norco, Vicodin), Oxycodone(Percocet, oxycontin) and hydromorphone amongst others.  Long term and even short term use of opiods can  cause: Increased pain response Dependence Constipation Depression Respiratory depression And more.  Withdrawal symptoms can include Flu like symptoms Nausea, vomiting And more Techniques to manage these symptoms Hydrate well Eat regular healthy meals Stay active Use relaxation techniques(deep breathing, meditating, yoga) Do Not substitute Alcohol to help with tapering If you have been on opioids for less than two weeks and do not have pain than it is ok to stop all together.  Plan to   wean off of opioids This plan should start within one week post op of your joint replacement. Maintain the same interval or time between taking each dose and first decrease the dose.  Cut the total daily intake of opioids by one tablet each day Next start to increase the time between doses. The last dose that should be eliminated is the evening dose.   IF YOU ARE TRANSFERRED TO A SKILLED REHAB FACILITY If the patient is transferred to a skilled rehab facility following release from the hospital, a list of the current medications will be sent to the facility for the patient to continue.  When discharged from the skilled rehab facility, please have the facility set up the patient's Home Health Physical Therapy prior to being released. Also, the skilled facility will be responsible for providing the patient with their medications at time of release from the facility to include their pain medication, the muscle relaxants, and their blood thinner medication. If the patient is still at the rehab facility at time of the two week follow up appointment, the skilled rehab facility will also need to assist the patient in arranging follow up appointment in our office and any transportation needs.  MAKE SURE YOU:  Understand these instructions.  Get help right away if you are not doing well or get worse.   DENTAL ANTIBIOTICS:  In most cases prophylactic antibiotics for Dental procdeures after total joint surgery are  not necessary.  Exceptions are as follows:  1. History of prior total joint infection  2. Severely immunocompromised (Organ Transplant, cancer chemotherapy, Rheumatoid biologic medications such as Humera)  3. Poorly controlled diabetes (A1C &gt; 8.0, blood glucose over 200)  If you have one of these conditions, contact your surgeon for an antibiotic prescription, prior to your dental procedure.    Pick up stool softner and laxative for home use following surgery while on pain medications. Do not submerge incision under water. Please use good hand washing techniques while changing dressing each day. May shower starting three days after surgery. Please use a clean towel to pat the incision dry following showers. Continue to use ice for pain and swelling after surgery. Do not use any lotions or creams on the incision until instructed by your surgeon.    Information on my medicine - XARELTO (Rivaroxaban)     Why was Xarelto prescribed for you? Xarelto was prescribed for you to reduce the risk of blood clots forming after orthopedic surgery. The medical term for these abnormal blood clots is venous thromboembolism (VTE).  What do you need to know about xarelto ? Take your Xarelto ONCE DAILY at the same time every day. You may take it either with or without food.  If you have difficulty swallowing the tablet whole, you may crush it and mix in applesauce just prior to taking your dose.  Take Xarelto exactly as prescribed by your doctor and DO NOT stop taking Xarelto without talking to the doctor who prescribed the medication.  Stopping without other VTE prevention medication to take the place of Xarelto may increase your risk of developing a clot.  After discharge, you should have regular check-up appointments with your healthcare provider that is prescribing your Xarelto.    What do you do if you miss a dose? If you miss a dose, take it as soon as you remember on the same  day then continue your regularly scheduled once daily regimen the next day. Do not take two doses of Xarelto on the   same day.   Important Safety Information A possible side effect of Xarelto is bleeding. You should call your healthcare provider right away if you experience any of the following: Bleeding from an injury or your nose that does not stop. Unusual colored urine (red or dark brown) or unusual colored stools (red or black). Unusual bruising for unknown reasons. A serious fall or if you hit your head (even if there is no bleeding).  Some medicines may interact with Xarelto and might increase your risk of bleeding while on Xarelto. To help avoid this, consult your healthcare provider or pharmacist prior to using any new prescription or non-prescription medications, including herbals, vitamins, non-steroidal anti-inflammatory drugs (NSAIDs) and supplements.  This website has more information on Xarelto: www.xarelto.com.   

## 2022-06-02 NOTE — Anesthesia Postprocedure Evaluation (Signed)
Anesthesia Post Note  Patient: Joshua Lowe  Procedure(s) Performed: TOTAL KNEE ARTHROPLASTY (Right: Knee)     Patient location during evaluation: PACU Anesthesia Type: Spinal Level of consciousness: awake and alert Pain management: pain level controlled Vital Signs Assessment: post-procedure vital signs reviewed and stable Respiratory status: spontaneous breathing and respiratory function stable Cardiovascular status: blood pressure returned to baseline and stable Postop Assessment: spinal receding Anesthetic complications: no  No notable events documented.  Last Vitals:  Vitals:   06/02/22 1645 06/02/22 1700  BP: 122/75 119/74  Pulse: (!) 59 (!) 57  Resp: 13 (!) 9  Temp:    SpO2: 100% 100%    Last Pain:  Vitals:   06/02/22 1645  TempSrc:   PainSc: 0-No pain                 Kennieth Rad

## 2022-06-02 NOTE — Evaluation (Signed)
Physical Therapy Evaluation Patient Details Name: Joshua Lowe MRN: [id number] DOB: Jun 02, 1956 Today's Date: 06/02/2022  History of Present Illness  Pt is a 66yo male presenting s/p R-TKA on 06/02/22. PMH: chronic back and neck pain, HLD, osteoporosis, PAF s/p ablation, DM,   Clinical Impression  Joshua Lowe is a 66 y.o. male POD 0 s/p R-TKA. Patient reports modified independence using SPC or RW interchangeably with mobility at baseline. Patient is now limited by functional impairments (see PT problem list below) and requires min assist for bed mobility, educated pt on use of gait belt to self-assist and pt demonstrated proper technique with sit to supine. Further mobility deferred secondary to reduced sensation and motor control at bilateral ankles, suspect secondary to spinal anesthesia. Patient instructed in exercise to facilitate ROM and circulation to manage edema. Provided incentive spirometer and with Vcs pt able to achieve 2025mL. Patient will benefit from continued skilled PT interventions to address impairments and progress towards PLOF. Acute PT will follow to progress mobility and stair training in preparation for safe discharge home.       Recommendations for follow up therapy are one component of a multi-disciplinary discharge planning process, led by the attending physician.  Recommendations may be updated based on patient status, additional functional criteria and insurance authorization.  Follow Up Recommendations Follow physician's recommendations for discharge plan and follow up therapies      Assistance Recommended at Discharge Frequent or constant Supervision/Assistance  Patient can return home with the following  A little help with walking and/or transfers;A little help with bathing/dressing/bathroom;Assistance with cooking/housework;Assist for transportation;Help with stairs or ramp for entrance    Equipment Recommendations None recommended by PT  Recommendations for  Other Services       Functional Status Assessment Patient has had a recent decline in their functional status and demonstrates the ability to make significant improvements in function in a reasonable and predictable amount of time.     Precautions / Restrictions Precautions Precautions: Fall;Knee Precaution Booklet Issued: No Precaution Comments: no pillow under the knee Restrictions Weight Bearing Restrictions: No Other Position/Activity Restrictions: wbat      Mobility  Bed Mobility Overal bed mobility: Needs Assistance Bed Mobility: Supine to Sit, Sit to Supine     Supine to sit: Min assist, HOB elevated Sit to supine: Min guard   General bed mobility comments: Supine to sit: min assist to bring RLE off bed. Pt sat EOB for ~30min. Pt able to use BUE to offload hips to scoot toward head of bed to adjust supine positioning with min guard. Sit to supine: educated pt on use of gait belt to self assist, pt comlpeted with min guard only.    Transfers                   General transfer comment: deferred    Ambulation/Gait               General Gait Details: deferred  Stairs            Wheelchair Mobility    Modified Rankin (Stroke Patients Only)       Balance                                             Pertinent Vitals/Pain Pain Assessment Pain Assessment: 0-10 Pain Score: 6  Pain Location: right knee Pain Descriptors /  Indicators: Operative site guarding Pain Intervention(s): Limited activity within patient's tolerance, Monitored during session, Repositioned, Ice applied    Home Living Family/patient expects to be discharged to:: Private residence Living Arrangements: Spouse/significant other Available Help at Discharge: Family;Available 24 hours/day Type of Home: House Home Access: Level entry (threshold from the garage into the house)       Home Layout: Two level;Able to live on main level with  bedroom/bathroom;Full bath on main level Home Equipment: BSC/3in1;Rolling Walker (2 wheels);Cane - single point      Prior Function Prior Level of Function : Independent/Modified Independent;Driving;Working/employed             Mobility Comments: RW and SPC interchangeably, recent plantar fasciitis episode ADLs Comments: ind     Hand Dominance        Extremity/Trunk Assessment   Upper Extremity Assessment Upper Extremity Assessment: Overall WFL for tasks assessed    Lower Extremity Assessment Lower Extremity Assessment: RLE deficits/detail;LLE deficits/detail RLE Deficits / Details: MMT ank DF/PF 3/5, reduced sensation, no extensor lag with SLR. RLE Sensation: decreased light touch LLE Deficits / Details: MMT ank DF/PF 3+/5, reduced sensation on bottom of foot LLE Sensation: decreased light touch    Cervical / Trunk Assessment Cervical / Trunk Assessment: Normal  Communication   Communication: No difficulties  Cognition Arousal/Alertness: Awake/alert Behavior During Therapy: WFL for tasks assessed/performed Overall Cognitive Status: Within Functional Limits for tasks assessed                                          General Comments General comments (skin integrity, edema, etc.): Wife Beth present    Exercises Total Joint Exercises Ankle Circles/Pumps: AROM, Both, 20 reps   Assessment/Plan    PT Assessment Patient needs continued PT services  PT Problem List Decreased strength;Decreased range of motion;Decreased activity tolerance;Decreased balance;Decreased mobility;Decreased coordination;Pain       PT Treatment Interventions DME instruction;Gait training;Stair training;Functional mobility training;Therapeutic activities;Therapeutic exercise;Balance training;Neuromuscular re-education;Patient/family education    PT Goals (Current goals can be found in the Care Plan section)  Acute Rehab PT Goals Patient Stated Goal: Walk without pain;  also return to surfing PT Goal Formulation: With patient Time For Goal Achievement: 06/09/22 Potential to Achieve Goals: Good    Frequency 7X/week     Co-evaluation               AM-PAC PT "6 Clicks" Mobility  Outcome Measure Help needed turning from your back to your side while in a flat bed without using bedrails?: None Help needed moving from lying on your back to sitting on the side of a flat bed without using bedrails?: A Little Help needed moving to and from a bed to a chair (including a wheelchair)?: A Little Help needed standing up from a chair using your arms (e.g., wheelchair or bedside chair)?: A Little Help needed to walk in hospital room?: A Little Help needed climbing 3-5 steps with a railing? : A Little 6 Click Score: 19    End of Session   Activity Tolerance: Patient tolerated treatment well Patient left: in bed;with call bell/phone within reach;with bed alarm set;with family/visitor present;with SCD's reapplied Nurse Communication: Mobility status PT Visit Diagnosis: Pain;Difficulty in walking, not elsewhere classified (R26.2) Pain - Right/Left: Right Pain - part of body: Knee    Time: 9379-0240 PT Time Calculation (min) (ACUTE ONLY): 32 min   Charges:  PT Evaluation $PT Eval Low Complexity: 1 Low          Coolidge Breeze, [place], DPT [facility] Department Office: [phone] Weekend pager: [phone]  Coolidge Breeze 06/02/2022, 7:17 PM

## 2022-06-02 NOTE — Anesthesia Procedure Notes (Addendum)
Spinal  Patient location during procedure: OR Start time: 06/02/2022 12:40 PM End time: 06/02/2022 12:45 PM Reason for block: surgical anesthesia Staffing Performed: anesthesiologist  Anesthesiologist: Marcene Duos, MD Performed by: Marcene Duos, MD Authorized by: Marcene Duos, MD   Preanesthetic Checklist Completed: patient identified, IV checked, site marked, risks and benefits discussed, surgical consent, monitors and equipment checked, pre-op evaluation and timeout performed Spinal Block Patient position: sitting Prep: DuraPrep Patient monitoring: heart rate, cardiac monitor, continuous pulse ox and blood pressure Approach: midline Location: L3-4 Injection technique: single-shot Needle Needle type: Pencan  Needle gauge: 24 G Needle length: 9 cm Assessment Sensory level: T4 Events: CSF return and second provider

## 2022-06-02 NOTE — Op Note (Signed)
OPERATIVE REPORT-TOTAL KNEE ARTHROPLASTY   Pre-operative diagnosis- Osteoarthritis  Right knee(s)  Post-operative diagnosis- Osteoarthritis Right knee(s)  Procedure-  Right  Total Knee Arthroplasty  Surgeon- Dione Plover. Glenora Morocho, MD  Assistant- Jaynie Bream, PA-C   Anesthesia-   Adductor canal block and spinal  EBL-50 mL   Drains None  Tourniquet time-  Total Tourniquet Time Documented: Thigh (Right) - 40 minutes Total: Thigh (Right) - 40 minutes     Complications- None  Condition-PACU - hemodynamically stable.   Brief Clinical Note  Joshua Lowe is a 66 y.o. year old male with end stage OA of his right knee with progressively worsening pain and dysfunction. He has constant pain, with activity and at rest and significant functional deficits with difficulties even with ADLs. He has had extensive non-op management including analgesics, injections of cortisone, and home exercise program, but remains in significant pain with significant dysfunction. Radiographs show bone on bone arthritis medial and patellofemoral. He presents now for right Total Knee Arthroplasty.     Procedure in detail---   The patient is brought into the operating room and positioned supine on the operating table. After successful administration of  Adductor canal block and spinal,   a tourniquet is placed high on the  Right thigh(s) and the lower extremity is prepped and draped in the usual sterile fashion. Time out is performed by the operating team and then the  Right lower extremity is wrapped in Esmarch, knee flexed and the tourniquet inflated to 300 mmHg.       A midline incision is made with a ten blade through the subcutaneous tissue to the level of the extensor mechanism. A fresh blade is used to make a medial parapatellar arthrotomy. Soft tissue over the proximal medial tibia is subperiosteally elevated to the joint line with a knife and into the semimembranosus bursa with a Cobb elevator. Soft tissue  over the proximal lateral tibia is elevated with attention being paid to avoiding the patellar tendon on the tibial tubercle. The patella is everted, knee flexed 90 degrees and the ACL and PCL are removed. Findings are bone on bone medial and patellofemoral with large global osteophytes        The drill is used to create a starting hole in the distal femur and the canal is thoroughly irrigated with sterile saline to remove the fatty contents. The 5 degree Right  valgus alignment guide is placed into the femoral canal and the distal femoral cutting block is pinned to remove 9 mm off the distal femur. Resection is made with an oscillating saw.      The tibia is subluxed forward and the menisci are removed. The extramedullary alignment guide is placed referencing proximally at the medial aspect of the tibial tubercle and distally along the second metatarsal axis and tibial crest. The block is pinned to remove 96mm off the more deficient medial  side. Resection is made with an oscillating saw. Size 8is the most appropriate size for the tibia and the proximal tibia is prepared with the modular drill and keel punch for that size.      The femoral sizing guide is placed and size 8 is most appropriate. Rotation is marked off the epicondylar axis and confirmed by creating a rectangular flexion gap at 90 degrees. The size 8 cutting block is pinned in this rotation and the anterior, posterior and chamfer cuts are made with the oscillating saw. The intercondylar block is then placed and that cut is made.  Trial size 8 tibial component, trial size 8 posterior stabilized femur and a 7  mm posterior stabilized rotating platform insert trial is placed. Full extension is achieved with excellent varus/valgus and anterior/posterior balance throughout full range of motion. The patella is everted and thickness measured to be 27  mm. Free hand resection is taken to 15 mm, a 41 template is placed, lug holes are drilled, trial  patella is placed, and it tracks normally. Osteophytes are removed off the posterior femur with the trial in place. All trials are removed and the cut bone surfaces prepared with pulsatile lavage. Cement is mixed and once ready for implantation, the size 8 tibial implant, size  8 posterior stabilized femoral component, and the size 41 patella are cemented in place and the patella is held with the clamp. The trial insert is placed and the knee held in full extension. The Exparel (20 ml mixed with 60 ml saline) is injected into the extensor mechanism, posterior capsule, medial and lateral gutters and subcutaneous tissues.  All extruded cement is removed and once the cement is hard the permanent 7 mm posterior stabilized rotating platform insert is placed into the tibial tray.      The wound is copiously irrigated with saline solution and the extensor mechanism closed with # 0 Stratofix suture. The tourniquet is released for a total tourniquet time of 40  minutes. Flexion against gravity is 140 degrees and the patella tracks normally. Subcutaneous tissue is closed with 2.0 vicryl and subcuticular with running 4.0 Monocryl. The incision is cleaned and dried and steri-strips and a bulky sterile dressing are applied. The limb is placed into a knee immobilizer and the patient is awakened and transported to recovery in stable condition.      Please note that a surgical assistant was a medical necessity for this procedure in order to perform it in a safe and expeditious manner. Surgical assistant was necessary to retract the ligaments and vital neurovascular structures to prevent injury to them and also necessary for proper positioning of the limb to allow for anatomic placement of the prosthesis.   [name], MD    06/02/2022, 1:50 PM

## 2022-06-03 ENCOUNTER — Encounter (HOSPITAL_COMMUNITY): Payer: Self-pay | Admitting: Orthopedic Surgery

## 2022-06-03 DIAGNOSIS — I4819 Other persistent atrial fibrillation: Secondary | ICD-10-CM | POA: Diagnosis not present

## 2022-06-03 DIAGNOSIS — E039 Hypothyroidism, unspecified: Secondary | ICD-10-CM | POA: Diagnosis not present

## 2022-06-03 DIAGNOSIS — M1711 Unilateral primary osteoarthritis, right knee: Secondary | ICD-10-CM | POA: Diagnosis not present

## 2022-06-03 DIAGNOSIS — Z79899 Other long term (current) drug therapy: Secondary | ICD-10-CM | POA: Diagnosis not present

## 2022-06-03 LAB — CBC
HCT: 37.6 % — ABNORMAL LOW (ref 39.0–52.0)
Hemoglobin: 12 g/dL — ABNORMAL LOW (ref 13.0–17.0)
MCH: 30.4 pg (ref 26.0–34.0)
MCHC: 31.9 g/dL (ref 30.0–36.0)
MCV: 95.2 fL (ref 80.0–100.0)
Platelets: 215 10*3/uL (ref 150–400)
RBC: 3.95 MIL/uL — ABNORMAL LOW (ref 4.22–5.81)
RDW: 14.7 % (ref 11.5–15.5)
WBC: 11.8 10*3/uL — ABNORMAL HIGH (ref 4.0–10.5)
nRBC: 0 % (ref 0.0–0.2)

## 2022-06-03 LAB — BASIC METABOLIC PANEL
Anion gap: 9 (ref 5–15)
BUN: 10 mg/dL (ref 8–23)
CO2: 22 mmol/L (ref 22–32)
Calcium: 7.7 mg/dL — ABNORMAL LOW (ref 8.9–10.3)
Chloride: 110 mmol/L (ref 98–111)
Creatinine, Ser: 0.51 mg/dL — ABNORMAL LOW (ref 0.61–1.24)
GFR, Estimated: >60 mL/min (ref >60)
Glucose, Bld: 119 mg/dL — ABNORMAL HIGH (ref 70–99)
Potassium: 3.8 mmol/L (ref 3.5–5.1)
Sodium: 141 mmol/L (ref 135–145)

## 2022-06-03 LAB — MAGNESIUM: Magnesium: 1.8 mg/dL (ref 1.7–2.4)

## 2022-06-03 MED ORDER — HYDROMORPHONE HCL 2 MG PO TABS: 2.0000 mg | ORAL_TABLET | Freq: Four times a day (QID) | ORAL | 0 refills | Status: DC | PRN

## 2022-06-03 MED ORDER — RIVAROXABAN 10 MG PO TABS: 10.0000 mg | ORAL_TABLET | Freq: Every day | ORAL | 0 refills | Status: AC

## 2022-06-03 MED ORDER — GABAPENTIN 300 MG PO CAPS: ORAL_CAPSULE | ORAL | 0 refills | Status: DC

## 2022-06-03 MED ORDER — TRAMADOL HCL 50 MG PO TABS: 50.0000 mg | ORAL_TABLET | Freq: Four times a day (QID) | ORAL | 0 refills | Status: DC | PRN

## 2022-06-03 MED ORDER — METHOCARBAMOL 500 MG PO TABS: 500.0000 mg | ORAL_TABLET | Freq: Four times a day (QID) | ORAL | 0 refills | Status: DC | PRN

## 2022-06-03 NOTE — Progress Notes (Addendum)
Physical Therapy Treatment Patient Details Name: Joshua Lowe MRN: [id number] DOB: 1956/06/21 Today's Date: 06/03/2022   History of Present Illness Pt is a 66yo male presenting s/p R-TKA on 06/02/22. PMH: chronic back and neck pain, HLD, osteoporosis, PAF s/p ablation, DM,    PT Comments    Pt ambulates with step to progression, able to ambulate 300 ft with RW and supv, progressing to slight step through pattern, no knee buckling noted. Pt completed single curb/step training with RW, able to ascend and descend with RW with correct sequencing, min guard for safety. Provided written/illustrated HEP and reviewed with pt, pt able to perform ankle pumps and quad sets appropriately. Pt with good family support and all DME needs at home, ready to d/c home with spouse support and OPPT beginning 11/30.   Recommendations for follow up therapy are one component of a multi-disciplinary discharge planning process, led by the attending physician.  Recommendations may be updated based on patient status, additional functional criteria and insurance authorization.  Follow Up Recommendations  Follow physician's recommendations for discharge plan and follow up therapies     Assistance Recommended at Discharge Frequent or constant Supervision/Assistance  Patient can return home with the following A little help with walking and/or transfers;A little help with bathing/dressing/bathroom;Assistance with cooking/housework;Assist for transportation;Help with stairs or ramp for entrance   Equipment Recommendations  None recommended by PT    Recommendations for Other Services       Precautions / Restrictions Precautions Precautions: Fall;Knee Precaution Booklet Issued: No Precaution Comments: no pillow under the knee Restrictions Weight Bearing Restrictions: No Other Position/Activity Restrictions: wbat     Mobility  Bed Mobility Overal bed mobility: Needs Assistance Bed Mobility: Supine to Sit   Supine to sit: Min guard  General bed mobility comments: in recliner    Transfers Overall transfer level: Needs assistance Equipment used: Rolling walker (2 wheels) Transfers: Sit to/from Stand Sit to Stand: Supervision  General transfer comment: BUE assisting to power to stand, RLE slightly extended for comfort to power up and for controlled lowering    Ambulation/Gait Ambulation/Gait assistance: Supervision Gait Distance (Feet): 300 Feet Assistive device: Rolling walker (2 wheels) Gait Pattern/deviations: Step-to pattern, Decreased stride length, Decreased weight shift to right, Decreased stance time - right Gait velocity: decreased  General Gait Details: step to with decreased RLE stance and weight-bearing, no knee buckling, progressed to slight step-through   Stairs Stairs: Yes Stairs assistance: Min guard  Number of Stairs: 1 (6" curb, x6 reps) General stair comments: pt ascends/descends single 6" curb with RW x6 reps, min guard, able to complete with correct sequencing   Wheelchair Mobility    Modified Rankin (Stroke Patients Only)       Balance Overall balance assessment: Needs assistance  Sitting balance-Leahy Scale: Good  Standing balance support: During functional activity, Bilateral upper extremity supported, Reliant on assistive device for balance Standing balance-Leahy Scale: Fair Standing balance comment: static standing able to release UEs from RW     Cognition Arousal/Alertness: Awake/alert Behavior During Therapy: WFL for tasks assessed/performed Overall Cognitive Status: Within Functional Limits for tasks assessed       Exercises Total Joint Exercises Ankle Circles/Pumps: Seated, AROM, Both, 5 reps Quad Sets: Seated, AROM, Strengthening, Both, 5 reps Heel Slides: Seated, AROM, Right, 5 reps    General Comments        Pertinent Vitals/Pain Pain Assessment Pain Assessment: 0-10 Pain Score: 8  Pain Location: R knee Pain Descriptors /  Indicators: Sharp, Sore Pain  Intervention(s): Limited activity within patient's tolerance, Monitored during session, Premedicated before session, Repositioned, Ice applied    Home Living                          Prior Function            PT Goals (current goals can now be found in the care plan section) Acute Rehab PT Goals Patient Stated Goal: Walk without pain; also return to surfing PT Goal Formulation: With patient Time For Goal Achievement: 06/09/22 Potential to Achieve Goals: Good Progress towards PT goals: Progressing toward goals    Frequency    7X/week      PT Plan      Co-evaluation              AM-PAC PT "6 Clicks" Mobility   Outcome Measure  Help needed turning from your back to your side while in a flat bed without using bedrails?: None Help needed moving from lying on your back to sitting on the side of a flat bed without using bedrails?: A Little Help needed moving to and from a bed to a chair (including a wheelchair)?: A Little Help needed standing up from a chair using your arms (e.g., wheelchair or bedside chair)?: A Little Help needed to walk in hospital room?: A Little Help needed climbing 3-5 steps with a railing? : A Little 6 Click Score: 19    End of Session Equipment Utilized During Treatment: Gait belt Activity Tolerance: Patient tolerated treatment well Patient left: in chair;with call bell/phone within reach;with chair alarm set Nurse Communication: Mobility status PT Visit Diagnosis: Pain;Difficulty in walking, not elsewhere classified (R26.2) Pain - Right/Left: Right Pain - part of body: Knee     Time: [id number] PT Time Calculation (min) (ACUTE ONLY): 31 min  Charges:  $Gait Training: 8-22 mins $Therapeutic Exercise: 8-22 mins                      Tori Halsey Hammen PT, DPT 06/03/22, 12:58 PM

## 2022-06-03 NOTE — TOC Transition Note (Signed)
Transition of Care [facility]) - CM/SW Discharge Note   Patient Details  Name: Joshua Lowe MRN: [id number] Date of Birth: 1956/04/15  [facility] CM/SW Contact:  Lennart Pall, LCSW Phone Number: 06/03/2022, 9:43 AM   Clinical Narrative:    Met briefly with pt who confirms he has needed DME at home.  OPPT already set up with [facility]. No TOC needs.   Final next level of care: OP Rehab Barriers to Discharge: No Barriers Identified   Patient Goals and CMS Choice Patient states their goals for this hospitalization and ongoing recovery are:: return home      Discharge Placement                       Discharge Plan and Services                DME Arranged: N/A DME Agency: NA                  Social Determinants of Health (SDOH) Interventions     Readmission Risk Interventions     No data to display

## 2022-06-03 NOTE — Progress Notes (Signed)
Subjective: 1 Day Post-Op Procedure(s) (LRB): TOTAL KNEE ARTHROPLASTY (Right) Patient reports pain as mild.   Patient seen in rounds by Dr. Lequita Halt. Patient is well, and has had no acute complaints or problems No issues overnight. Denies chest pain, SOB, or calf pain. Foley catheter removed this AM.  We will continue therapy today, deferred ambulation yesterday.  Objective: Vital signs in last 24 hours: Temp:  [97.4 F (36.3 C)-98.3 F (36.8 C)] 97.7 F (36.5 C) (11/28 0504) Pulse Rate:  [48-81] 55 (11/28 0504) Resp:  [9-21] 16 (11/28 0504) BP: (101-159)/(62-90) 110/64 (11/28 0504) SpO2:  [88 %-100 %] 100 % (11/28 0504) Weight:  [89 kg] 89 kg (11/27 1055)  Intake/Output from previous day:  Intake/Output Summary (Last 24 hours) at 06/03/2022 0734 Last data filed at 06/03/2022 0700 Gross per 24 hour  Intake 3457.41 ml  Output 2100 ml  Net 1357.41 ml     Intake/Output this shift: No intake/output data recorded.  Labs: Recent Labs    06/03/22 0332  HGB 12.0*   Recent Labs    06/03/22 0332  WBC 11.8*  RBC 3.95*  HCT 37.6*  PLT 215   Recent Labs    06/02/22 1931 06/03/22 0332  NA 137 141  K 3.8 3.8  CL 104 110  CO2 24 22  BUN 9 10  CREATININE 0.87 0.51*  GLUCOSE 205* 119*  CALCIUM 8.6* 7.7*   No results for input(s): "LABPT", "INR" in the last 72 hours.  Exam: General - Patient is Alert and Oriented Extremity - Neurologically intact Neurovascular intact Sensation intact distally Dorsiflexion/Plantar flexion intact Dressing - dressing C/D/I Motor Function - intact, moving foot and toes well on exam.   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   Arthritis    Body mass index 31.0-31.9, adult    Cervical disc disease    Chronic back pain    Chronic neck pain    Eosinophilic disorder    Family history of adverse reaction to anesthesia    mother had nausea    Graves' disease    H/O colonoscopy 12/2008   Heart murmur    hx of years ago not detectable now   Hyperlipidemia    Hyperthyroidism    s/p RAI ablation   Hypothyroidism    Insomnia    Lumbar back pain    Osteoporosis    Persistent atrial fibrillation (HCC) 04/12/2019   Pre-diabetes    Pruritus, unspecified    Sleep apnea    oral device   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   Weight as of this encounter: 89 kg. Advance diet Up with therapy D/C IV fluids   Patient's anticipated LOS is less than 2 midnights, meeting these requirements: - Younger than 40 - Lives within 1 hour of care - Has a competent adult at home to recover with post-op recover - NO history of  - Diabetes  - Coronary Artery Disease  - Heart failure  - Heart attack  - Stroke  - DVT/VTE  - Respiratory Failure/COPD  - Renal failure  - Anemia  -  Advanced Liver disease  DVT Prophylaxis - Xarelto Weight bearing as tolerated. Continue therapy.  Plan is to go Home after hospital stay. Plan for discharge later today if progresses with therapy and pain controlled (chronic pain management patient). Scheduled for OPPT at [facility] Follow-up in the office in 2 weeks.  The PDMP database was reviewed today prior to any opioid medications being prescribed to this patient.  Theresa Duty, PA-C Orthopedic Surgery [phone] 06/03/2022, 7:34 AM

## 2022-06-03 NOTE — Progress Notes (Signed)
Physical Therapy Treatment Patient Details Name: Joshua Lowe MRN: [id number] DOB: 1956/05/15 Today's Date: 06/03/2022   History of Present Illness Pt is a 66yo male presenting s/p R-TKA on 06/02/22. PMH: chronic back and neck pain, HLD, osteoporosis, PAF s/p ablation, DM,    PT Comments    Pt with 8/10 pain reports upon arrival, but agreeable to therapy. Pt tolerates BLE exercises without increased pain, good quad activation noted. Pt powers to stand with BUE assisting, min guard for safety and RLE slightly extended for comfort. Pt ambulates 140 ft with RW, step to pattern, no knee buckling or overt LOB. Pt limited by pain; plan to progress gait and complete threshold training next session with hopes to d/c home with family support. RN notified of pt's continuing high pain.   Recommendations for follow up therapy are one component of a multi-disciplinary discharge planning process, led by the attending physician.  Recommendations may be updated based on patient status, additional functional criteria and insurance authorization.  Follow Up Recommendations  Follow physician's recommendations for discharge plan and follow up therapies     Assistance Recommended at Discharge Frequent or constant Supervision/Assistance  Patient can return home with the following A little help with walking and/or transfers;A little help with bathing/dressing/bathroom;Assistance with cooking/housework;Assist for transportation;Help with stairs or ramp for entrance   Equipment Recommendations  None recommended by PT    Recommendations for Other Services       Precautions / Restrictions Precautions Precautions: Fall;Knee Precaution Booklet Issued: No Precaution Comments: no pillow under the knee Restrictions Weight Bearing Restrictions: No Other Position/Activity Restrictions: wbat     Mobility  Bed Mobility Overal bed mobility: Needs Assistance Bed Mobility: Supine to Sit  Supine to sit: Min  guard  General bed mobility comments: increased time, pt self assisting RLE to EOB    Transfers Overall transfer level: Needs assistance Equipment used: Rolling walker (2 wheels) Transfers: Sit to/from Stand Sit to Stand: Min guard  General transfer comment: BUE assisting to power to stand, RLE slightly extended for comfort    Ambulation/Gait Ambulation/Gait assistance: Min guard Gait Distance (Feet): 140 Feet Assistive device: Rolling walker (2 wheels) Gait Pattern/deviations: Step-to pattern, Decreased stride length, Decreased weight shift to right, Decreased stance time - right Gait velocity: decreased  General Gait Details: step-to pattern, decreased RLE weight-bearing and decreased heel-toe pattern, no knee buckling noted, limited by pain   Stairs             Wheelchair Mobility    Modified Rankin (Stroke Patients Only)       Balance Overall balance assessment: Needs assistance  Sitting balance-Leahy Scale: Good  Standing balance support: During functional activity, Bilateral upper extremity supported, Reliant on assistive device for balance Standing balance-Leahy Scale: Poor     Cognition Arousal/Alertness: Awake/alert Behavior During Therapy: WFL for tasks assessed/performed Overall Cognitive Status: Within Functional Limits for tasks assessed    Exercises Total Joint Exercises Ankle Circles/Pumps: AROM, Both, 20 reps Quad Sets: Supine, AROM, Strengthening, Both, 5 reps Heel Slides: Seated, AROM, Right, 5 reps    General Comments        Pertinent Vitals/Pain Pain Assessment Pain Assessment: 0-10 Pain Score: 9  Pain Location: R knee Pain Descriptors / Indicators: Sharp Pain Intervention(s): Limited activity within patient's tolerance, Monitored during session, Premedicated before session, Repositioned, Ice applied    Home Living  Prior Function            PT Goals (current goals can now be found in the  care plan section) Acute Rehab PT Goals Patient Stated Goal: Walk without pain; also return to surfing PT Goal Formulation: With patient Time For Goal Achievement: 06/09/22 Potential to Achieve Goals: Good Progress towards PT goals: Progressing toward goals    Frequency    7X/week      PT Plan      Co-evaluation              AM-PAC PT "6 Clicks" Mobility   Outcome Measure  Help needed turning from your back to your side while in a flat bed without using bedrails?: None Help needed moving from lying on your back to sitting on the side of a flat bed without using bedrails?: A Little Help needed moving to and from a bed to a chair (including a wheelchair)?: A Little Help needed standing up from a chair using your arms (e.g., wheelchair or bedside chair)?: A Little Help needed to walk in hospital room?: A Little Help needed climbing 3-5 steps with a railing? : A Little 6 Click Score: 19    End of Session Equipment Utilized During Treatment: Gait belt Activity Tolerance: Patient tolerated treatment well Patient left: in chair;with call bell/phone within reach;with chair alarm set Nurse Communication: Mobility status PT Visit Diagnosis: Pain;Difficulty in walking, not elsewhere classified (R26.2) Pain - Right/Left: Right Pain - part of body: Knee     Time: 8469-6295 PT Time Calculation (min) (ACUTE ONLY): 27 min  Charges:  $Gait Training: 8-22 mins $Therapeutic Exercise: 8-22 mins                      Tori Rondi Ivy PT, DPT 06/03/22, 10:11 AM

## 2022-06-04 NOTE — Discharge Summary (Signed)
Patient ID: JESSTIN FECHNER MRN: [id number] DOB/AGE: 64-01-1956 64 y.o.  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   Arthritis    Body mass index 31.0-31.9, adult    Cervical disc disease    Chronic back pain    Chronic neck pain    Eosinophilic disorder    Family history of adverse reaction to anesthesia    mother had nausea   Graves' disease    H/O colonoscopy 12/2008   Heart murmur    hx of years ago not detectable now   Hyperlipidemia    Hyperthyroidism    s/p RAI ablation   Hypothyroidism    Insomnia    Lumbar back pain    Osteoporosis    Persistent atrial fibrillation ([place]) 04/12/2019   Pre-diabetes    Pruritus, unspecified    Sleep apnea    oral device   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 male who was admitted 06/02/2022 for operative treatment ofOA (osteoarthritis) of knee. Patient has severe unremitting pain that affects sleep, daily activities, and work/hobbies. After pre-op clearance the patient was taken to the operating room on 06/02/2022 and underwent  Procedure(s): TOTAL KNEE ARTHROPLASTY.    Patient was given perioperative antibiotics:  Anti-infectives (From admission, onward)    Start     Dose/Rate Route Frequency Ordered Stop   06/03/22 1000  Tenofovir Alafenamide Fumarate TABS 25 mg  Status:  Discontinued        25 mg Oral Daily 06/02/22 1727 06/03/22 2005   06/02/22 1830  ceFAZolin (ANCEF) IVPB 2g/100 mL premix        2 g 200 mL/hr over 30 Minutes Intravenous Every 6 hours  06/02/22 1727 06/03/22 0111   06/02/22 1045  ceFAZolin (ANCEF) IVPB 2g/100 mL premix        2 g 200 mL/hr over 30 Minutes Intravenous On call to O.R. 06/02/22 1033 06/02/22 1256        Patient was given sequential compression devices, early ambulation, and chemoprophylaxis to prevent DVT.  Patient benefited maximally from hospital stay and there were no complications.    Recent vital signs: Patient Vitals for the past 24 hrs:  BP Temp Pulse Resp SpO2  06/03/22 1409 137/67 98.2 F (36.8 C) (!) 59 19 100 %     Recent laboratory studies:  Recent Labs    06/02/22 1931 06/03/22 0332  WBC  --  11.8*  HGB  --  12.0*  HCT  --  37.6*  PLT  --  215  NA 137 141  K 3.8 3.8  CL 104 110  CO2 24 22  BUN 9 10  CREATININE 0.87 0.51*  GLUCOSE 205* 119*  CALCIUM 8.6* 7.7*     Discharge Medications:   Allergies as of 06/03/2022       Reactions   Bee Venom Anaphylaxis, Swelling   Bee Sting-         Medication List     STOP taking these medications    Melatonin 10 MG Tabs   Oxycodone  HCl 10 MG Tabs       TAKE these medications    cetirizine 10 MG tablet Commonly known as: ZYRTEC Take 10 mg by mouth daily as needed for allergies.   colchicine 0.6 MG tablet Take 1 tablet by mouth daily as needed (gout).   dofetilide 125 MCG capsule Commonly known as: TIKOSYN TAKE 1 CAPSULE BY MOUTH TWICE DAILY ( TAKE WITH THE CAPSULE TO EQUAL TWICE DAILY ) What changed: See the new instructions.   dofetilide 250 MCG capsule Commonly known as: TIKOSYN Take 1 capsule (250 mcg total) by mouth 2 (two) times daily. Take with dose to equal twice a day What changed: Another medication with the same name was changed. Make sure you understand how and when to take each.   EPINEPHrine 0.3 mg/0.3 mL Soaj injection Commonly known as: EPI-PEN Inject 1 mL into the skin as needed for allergies.   gabapentin 300 MG capsule Commonly known as: NEURONTIN Take a 300  mg capsule three times a day for two weeks following surgery.Then take a 300 mg capsule two times a day for two weeks. Then take a 300 mg capsule once a day for two weeks. Then discontinue.   HYDROmorphone 2 MG tablet Commonly known as: DILAUDID Take 1-2 tablets (2-4 mg total) by mouth every 6 (six) hours as needed for severe pain.   levothyroxine 175 MCG tablet Commonly known as: SYNTHROID Take 175 mcg by mouth See admin instructions. Take 175 mcg daily except skip dose on Thursdays  Wife to bring from home due to pt takes only brand name per his PCP.   methocarbamol 500 MG tablet Commonly known as: ROBAXIN Take 1 tablet (500 mg total) by mouth every 6 (six) hours as needed for muscle spasms.   rivaroxaban 10 MG Tabs tablet Commonly known as: XARELTO Take 1 tablet (10 mg total) by mouth daily with breakfast for 20 days. Then take one 81 mg aspirin once a day for three weeks. Then discontinue aspirin.   traMADol 50 MG tablet Commonly known as: ULTRAM Take 1-2 tablets (50-100 mg total) by mouth every 6 (six) hours as needed for moderate pain.   Vemlidy 25 MG Tabs Generic drug: Tenofovir Alafenamide Fumarate Take 25 mg by mouth daily. Wife to bring from home in case pharmacy unable to supply .   zolpidem 10 MG tablet Commonly known as: AMBIEN Take 10 mg by mouth at bedtime.               Discharge Care Instructions  (From admission, onward)           Start     Ordered   06/03/22 0000  Weight bearing as tolerated        06/03/22 0737   06/03/22 0000  Change dressing       Comments: You may remove the bulky bandage (ACE wrap and gauze) two days after surgery. You will have an adhesive waterproof bandage underneath. Leave this in place until your first follow-up appointment.   06/03/22 0737            Diagnostic Studies: No results found.  Disposition: Discharge disposition: 01-Home or Self Care       Discharge Instructions     Call MD / Call 911    Complete by: As directed    If you experience chest pain or shortness of breath, CALL 911 and be transported to the hospital emergency room.  If you develope a fever above  69 F, pus (white drainage) or increased drainage or redness at the wound, or calf pain, call your surgeon's office.   Change dressing   Complete by: As directed    You may remove the bulky bandage (ACE wrap and gauze) two days after surgery. You will have an adhesive waterproof bandage underneath. Leave this in place until your first follow-up appointment.   Constipation Prevention   Complete by: As directed    Drink plenty of fluids.  Prune juice may be helpful.  You may use a stool softener, such as Colace (over the counter) 100 mg twice a day.  Use MiraLax (over the counter) for constipation as needed.   Diet - low sodium heart healthy   Complete by: As directed    Do not put a pillow under the knee. Place it under the heel.   Complete by: As directed    Driving restrictions   Complete by: As directed    No driving for two weeks   Post-operative opioid taper instructions:   Complete by: As directed    POST-OPERATIVE OPIOID TAPER INSTRUCTIONS: It is important to wean off of your opioid medication as soon as possible. If you do not need pain medication after your surgery it is ok to stop day one. Opioids include: Codeine, Hydrocodone(Norco, Vicodin), Oxycodone(Percocet, oxycontin) and hydromorphone amongst others.  Long term and even short term use of opiods can cause: Increased pain response Dependence Constipation Depression Respiratory depression And more.  Withdrawal symptoms can include Flu like symptoms Nausea, vomiting And more Techniques to manage these symptoms Hydrate well Eat regular healthy meals Stay active Use relaxation techniques(deep breathing, meditating, yoga) Do Not substitute Alcohol to help with tapering If you have been on opioids for less than two weeks and do not have pain than it  is ok to stop all together.  Plan to wean off of opioids This plan should start within one week post op of your joint replacement. Maintain the same interval or time between taking each dose and first decrease the dose.  Cut the total daily intake of opioids by one tablet each day Next start to increase the time between doses. The last dose that should be eliminated is the evening dose.      TED hose   Complete by: As directed    Use stockings (TED hose) for three weeks on both leg(s).  You may remove them at night for sleeping.   Weight bearing as tolerated   Complete by: As directed         Follow-up Information     Aluisio, Pilar Plate, MD. Schedule an appointment as soon as possible for a visit in 2 week(s).   Specialty: Orthopedic Surgery Contact information: [address] [place] [address] [phone]                  Signed: Theresa Duty 06/04/2022, 10:41 AM

## 2022-06-05 DIAGNOSIS — M25661 Stiffness of right knee, not elsewhere classified: Secondary | ICD-10-CM | POA: Diagnosis not present

## 2022-06-09 DIAGNOSIS — M25661 Stiffness of right knee, not elsewhere classified: Secondary | ICD-10-CM | POA: Diagnosis not present

## 2022-06-09 DIAGNOSIS — M25561 Pain in right knee: Secondary | ICD-10-CM | POA: Diagnosis not present

## 2022-06-11 DIAGNOSIS — M25661 Stiffness of right knee, not elsewhere classified: Secondary | ICD-10-CM | POA: Diagnosis not present

## 2022-06-11 DIAGNOSIS — M25561 Pain in right knee: Secondary | ICD-10-CM | POA: Diagnosis not present

## 2022-06-13 DIAGNOSIS — M25661 Stiffness of right knee, not elsewhere classified: Secondary | ICD-10-CM | POA: Diagnosis not present

## 2022-06-13 DIAGNOSIS — M25561 Pain in right knee: Secondary | ICD-10-CM | POA: Diagnosis not present

## 2022-06-16 DIAGNOSIS — M25561 Pain in right knee: Secondary | ICD-10-CM | POA: Diagnosis not present

## 2022-06-16 DIAGNOSIS — M25661 Stiffness of right knee, not elsewhere classified: Secondary | ICD-10-CM | POA: Diagnosis not present

## 2022-06-18 DIAGNOSIS — M25661 Stiffness of right knee, not elsewhere classified: Secondary | ICD-10-CM | POA: Diagnosis not present

## 2022-06-18 DIAGNOSIS — M25561 Pain in right knee: Secondary | ICD-10-CM | POA: Diagnosis not present

## 2022-06-20 DIAGNOSIS — M25661 Stiffness of right knee, not elsewhere classified: Secondary | ICD-10-CM | POA: Diagnosis not present

## 2022-06-20 DIAGNOSIS — M25561 Pain in right knee: Secondary | ICD-10-CM | POA: Diagnosis not present

## 2022-06-23 DIAGNOSIS — M25561 Pain in right knee: Secondary | ICD-10-CM | POA: Diagnosis not present

## 2022-06-23 DIAGNOSIS — M25661 Stiffness of right knee, not elsewhere classified: Secondary | ICD-10-CM | POA: Diagnosis not present

## 2022-06-25 DIAGNOSIS — M25561 Pain in right knee: Secondary | ICD-10-CM | POA: Diagnosis not present

## 2022-06-25 DIAGNOSIS — M25661 Stiffness of right knee, not elsewhere classified: Secondary | ICD-10-CM | POA: Diagnosis not present

## 2022-06-27 DIAGNOSIS — M25661 Stiffness of right knee, not elsewhere classified: Secondary | ICD-10-CM | POA: Diagnosis not present

## 2022-06-27 DIAGNOSIS — M25561 Pain in right knee: Secondary | ICD-10-CM | POA: Diagnosis not present

## 2022-07-01 DIAGNOSIS — M25561 Pain in right knee: Secondary | ICD-10-CM | POA: Diagnosis not present

## 2022-07-01 DIAGNOSIS — M25661 Stiffness of right knee, not elsewhere classified: Secondary | ICD-10-CM | POA: Diagnosis not present

## 2022-07-03 DIAGNOSIS — M25661 Stiffness of right knee, not elsewhere classified: Secondary | ICD-10-CM | POA: Diagnosis not present

## 2022-07-03 DIAGNOSIS — M25561 Pain in right knee: Secondary | ICD-10-CM | POA: Diagnosis not present

## 2022-07-08 DIAGNOSIS — Z5189 Encounter for other specified aftercare: Secondary | ICD-10-CM | POA: Diagnosis not present

## 2022-07-09 DIAGNOSIS — M25561 Pain in right knee: Secondary | ICD-10-CM | POA: Diagnosis not present

## 2022-07-09 DIAGNOSIS — M25661 Stiffness of right knee, not elsewhere classified: Secondary | ICD-10-CM | POA: Diagnosis not present

## 2022-07-11 DIAGNOSIS — M25561 Pain in right knee: Secondary | ICD-10-CM | POA: Diagnosis not present

## 2022-07-11 DIAGNOSIS — M25661 Stiffness of right knee, not elsewhere classified: Secondary | ICD-10-CM | POA: Diagnosis not present

## 2022-07-15 DIAGNOSIS — M25661 Stiffness of right knee, not elsewhere classified: Secondary | ICD-10-CM | POA: Diagnosis not present

## 2022-07-15 DIAGNOSIS — M25561 Pain in right knee: Secondary | ICD-10-CM | POA: Diagnosis not present

## 2022-07-17 DIAGNOSIS — M25661 Stiffness of right knee, not elsewhere classified: Secondary | ICD-10-CM | POA: Diagnosis not present

## 2022-07-17 DIAGNOSIS — M25561 Pain in right knee: Secondary | ICD-10-CM | POA: Diagnosis not present

## 2022-07-19 IMAGING — US US ABDOMEN LIMITED
1 series · 14 of 25 positions shown · non-contrast
Comparison: 09/28/2020

CLINICAL DATA: Chronic hepatitis B infection.

EXAM:
ULTRASOUND ABDOMEN LIMITED RIGHT UPPER QUADRANT

[Series 1: us abdomen limited · 0.21mm/px · 14 of 53 slices shown]
[im 1/53]
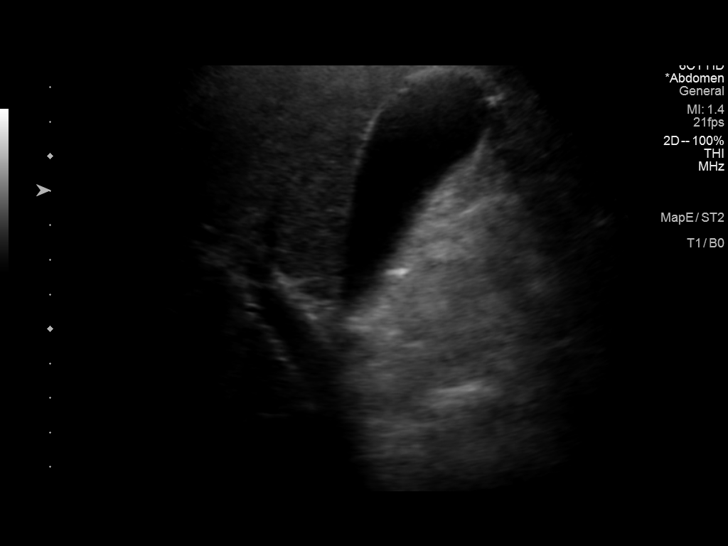
[im 5/53]
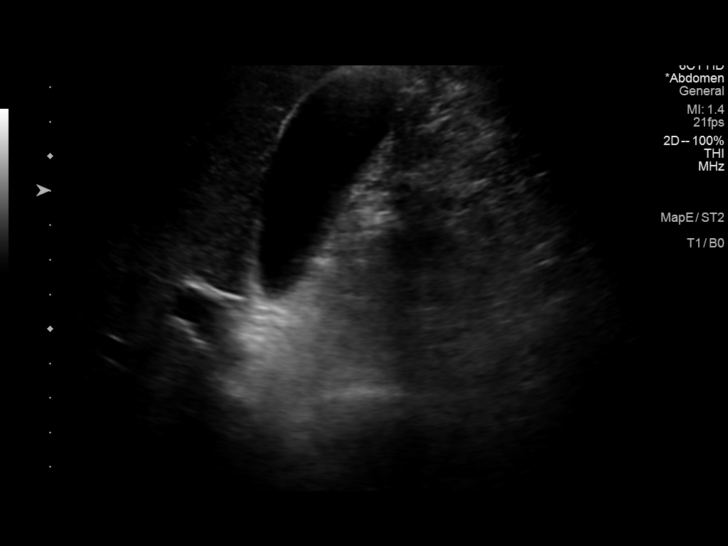
[im 9/53]
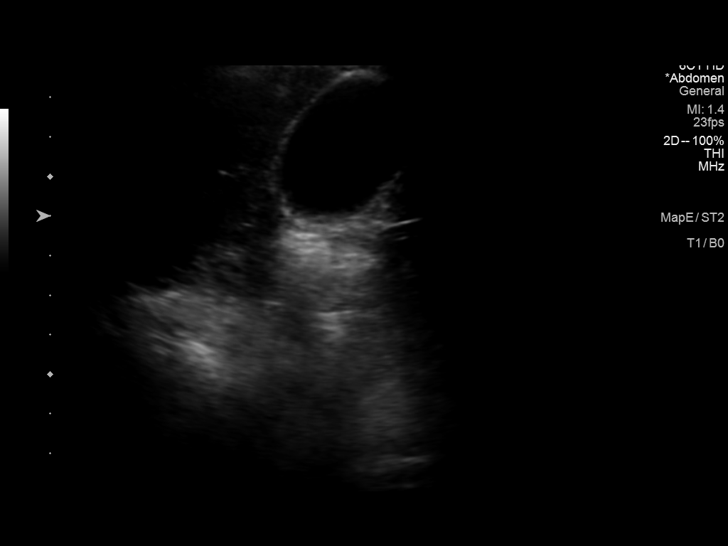
[im 14/53]
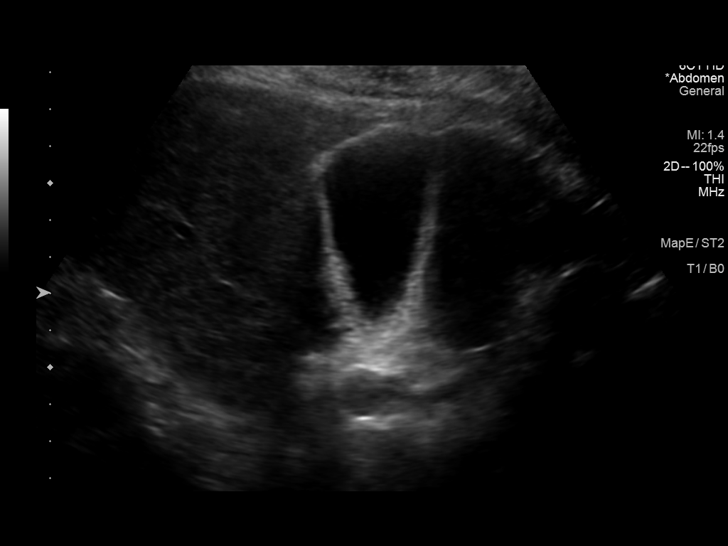
[im 18/53]
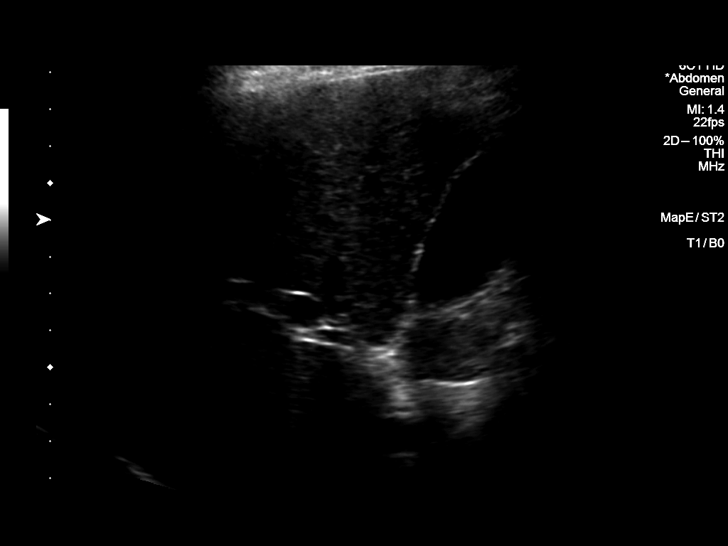
[im 20/53]
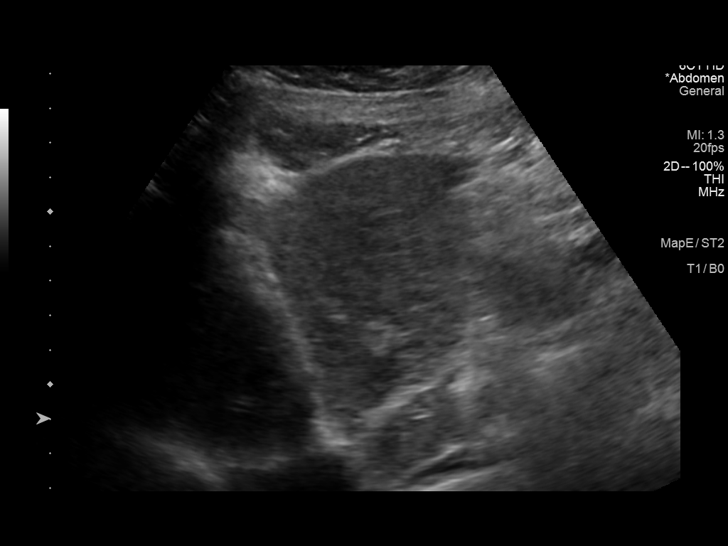
[im 24/53]
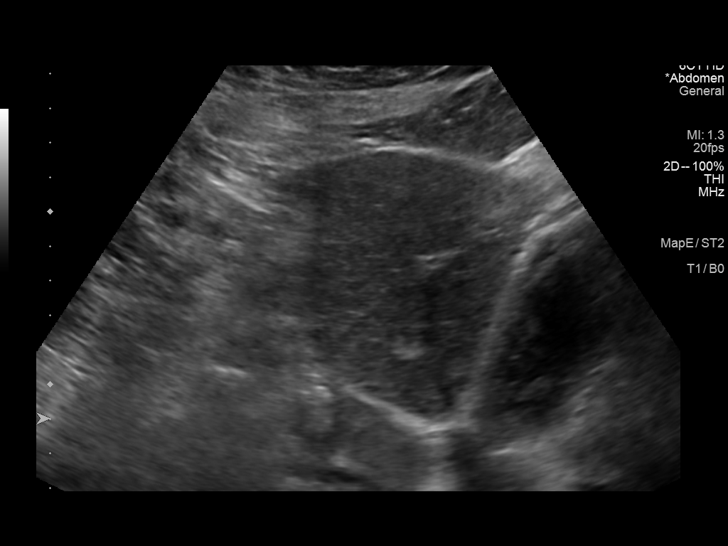
[im 29/53]
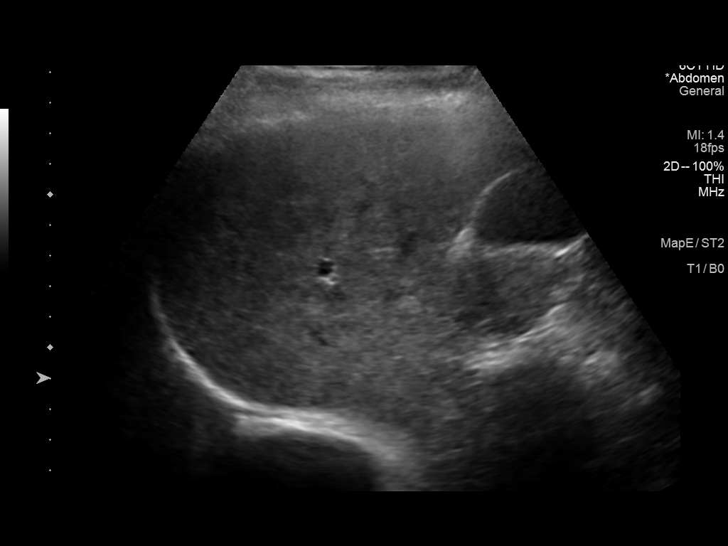
[im 33/53]
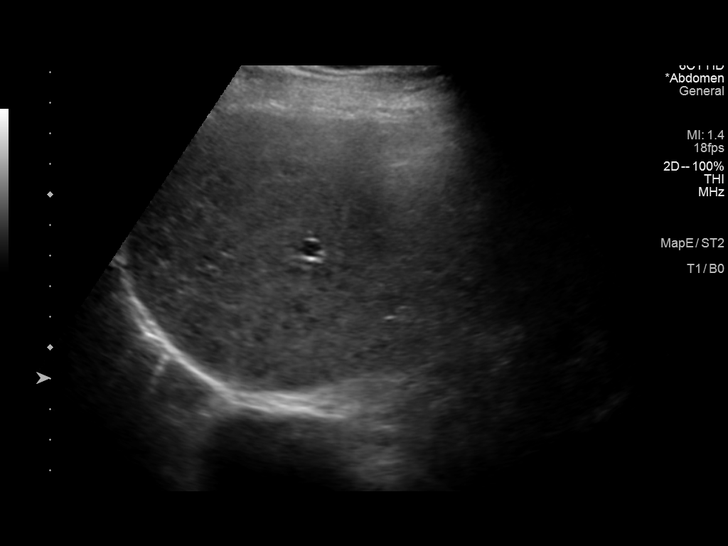
[im 35/53]
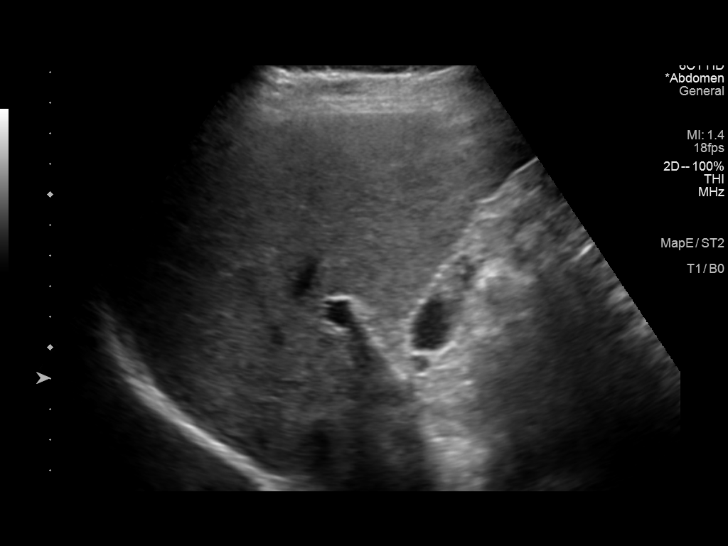
[im 40/53]
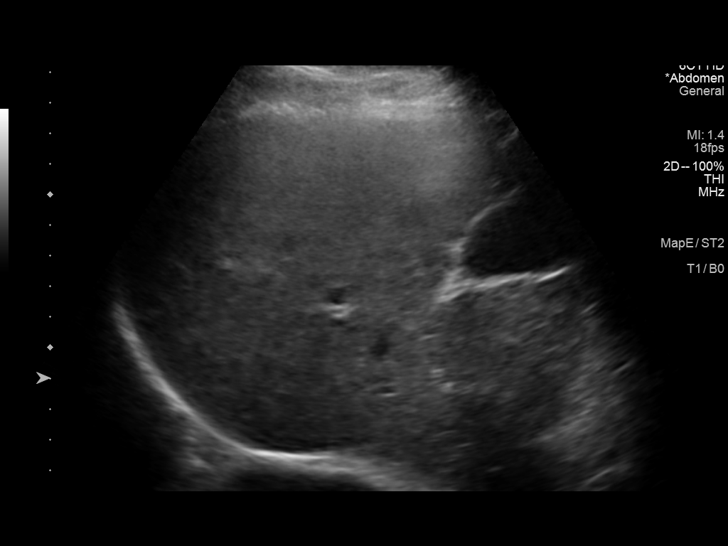
[im 44/53]
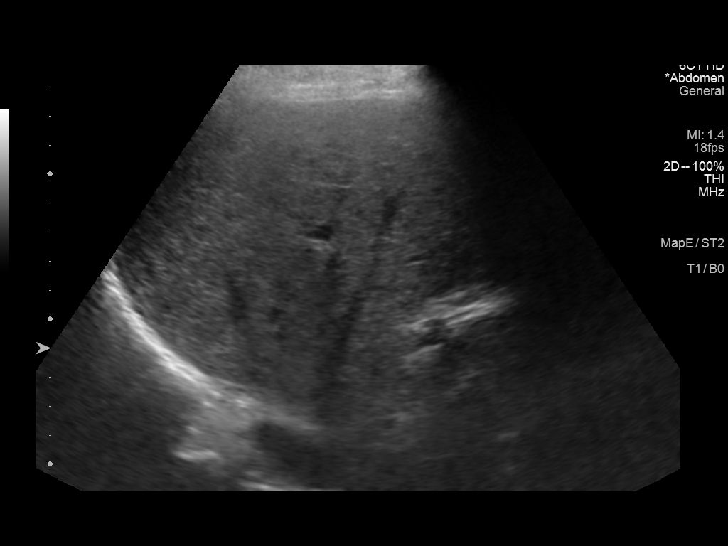
[im 48/53]
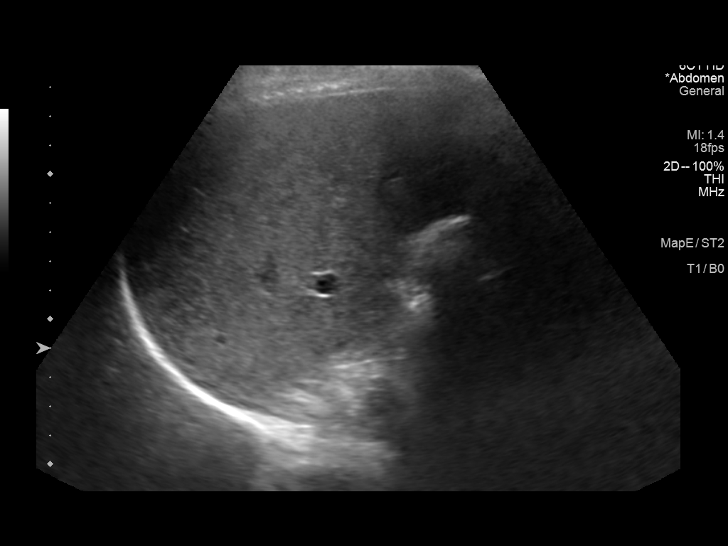
[im 53/53]
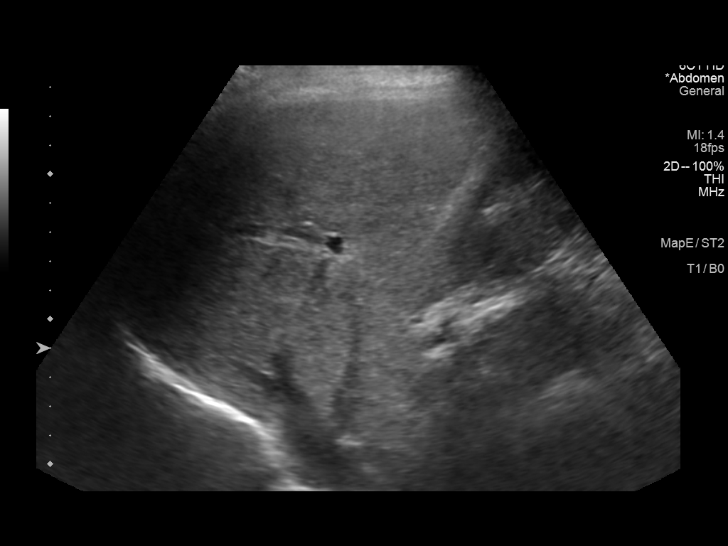

[14 of 25 positions shown; findings below may reference images not displayed]

FINDINGS: Gallbladder:

No gallstones or wall thickening visualized. No sonographic Murphy
sign noted by sonographer.

Common bile duct:

Diameter: Normal, 4 mm.

Liver:

No focal lesion identified. Within normal limits in parenchymal
echogenicity. Portal vein is patent on color Doppler imaging with
normal direction of blood flow towards the liver.

Other: None.
IMPRESSION: Normal right upper quadrant ultrasound. No evidence of cirrhosis or
focal liver lesion.

## 2022-07-23 DIAGNOSIS — M25561 Pain in right knee: Secondary | ICD-10-CM | POA: Diagnosis not present

## 2022-07-23 DIAGNOSIS — M25661 Stiffness of right knee, not elsewhere classified: Secondary | ICD-10-CM | POA: Diagnosis not present

## 2022-07-25 DIAGNOSIS — M25561 Pain in right knee: Secondary | ICD-10-CM | POA: Diagnosis not present

## 2022-07-25 DIAGNOSIS — M25661 Stiffness of right knee, not elsewhere classified: Secondary | ICD-10-CM | POA: Diagnosis not present

## 2022-07-28 DIAGNOSIS — M25561 Pain in right knee: Secondary | ICD-10-CM | POA: Diagnosis not present

## 2022-07-28 DIAGNOSIS — M25661 Stiffness of right knee, not elsewhere classified: Secondary | ICD-10-CM | POA: Diagnosis not present

## 2022-07-31 DIAGNOSIS — M25561 Pain in right knee: Secondary | ICD-10-CM | POA: Diagnosis not present

## 2022-07-31 DIAGNOSIS — M25661 Stiffness of right knee, not elsewhere classified: Secondary | ICD-10-CM | POA: Diagnosis not present

## 2022-08-01 ENCOUNTER — Other Ambulatory Visit (HOSPITAL_COMMUNITY): Payer: BC Managed Care – PPO

## 2022-08-04 DIAGNOSIS — M25561 Pain in right knee: Secondary | ICD-10-CM | POA: Diagnosis not present

## 2022-08-04 DIAGNOSIS — M25661 Stiffness of right knee, not elsewhere classified: Secondary | ICD-10-CM | POA: Diagnosis not present

## 2022-08-07 DIAGNOSIS — M25661 Stiffness of right knee, not elsewhere classified: Secondary | ICD-10-CM | POA: Diagnosis not present

## 2022-08-07 DIAGNOSIS — M25561 Pain in right knee: Secondary | ICD-10-CM | POA: Diagnosis not present

## 2022-08-14 ENCOUNTER — Encounter (HOSPITAL_COMMUNITY): Payer: Self-pay | Admitting: *Deleted

## 2022-09-03 ENCOUNTER — Other Ambulatory Visit (HOSPITAL_COMMUNITY): Payer: Self-pay | Admitting: *Deleted

## 2022-09-03 MED ORDER — DOFETILIDE 125 MCG PO CAPS: ORAL_CAPSULE | ORAL | 1 refills | Status: DC

## 2022-09-03 MED ORDER — DOFETILIDE 250 MCG PO CAPS: 250.0000 ug | ORAL_CAPSULE | Freq: Two times a day (BID) | ORAL | 1 refills | Status: DC

## 2022-10-16 DIAGNOSIS — L814 Other melanin hyperpigmentation: Secondary | ICD-10-CM | POA: Diagnosis not present

## 2022-10-16 DIAGNOSIS — L738 Other specified follicular disorders: Secondary | ICD-10-CM | POA: Diagnosis not present

## 2022-10-16 DIAGNOSIS — D485 Neoplasm of uncertain behavior of skin: Secondary | ICD-10-CM | POA: Diagnosis not present

## 2022-10-16 DIAGNOSIS — L570 Actinic keratosis: Secondary | ICD-10-CM | POA: Diagnosis not present

## 2022-10-16 DIAGNOSIS — L281 Prurigo nodularis: Secondary | ICD-10-CM | POA: Diagnosis not present

## 2022-10-21 ENCOUNTER — Other Ambulatory Visit: Payer: BC Managed Care – PPO

## 2022-10-21 DIAGNOSIS — K7401 Hepatic fibrosis, early fibrosis: Secondary | ICD-10-CM | POA: Diagnosis not present

## 2022-10-21 DIAGNOSIS — K760 Fatty (change of) liver, not elsewhere classified: Secondary | ICD-10-CM | POA: Diagnosis not present

## 2022-10-21 DIAGNOSIS — B181 Chronic viral hepatitis B without delta-agent: Secondary | ICD-10-CM | POA: Diagnosis not present

## 2022-10-22 ENCOUNTER — Encounter: Payer: Self-pay | Admitting: Cardiology

## 2022-10-22 ENCOUNTER — Ambulatory Visit: Payer: BC Managed Care – PPO | Attending: Cardiology | Admitting: Cardiology

## 2022-10-22 ENCOUNTER — Ambulatory Visit: Payer: BC Managed Care – PPO

## 2022-10-22 VITALS — BP 126/72 | HR 62 | Ht 72.0 in | Wt 213.0 lb

## 2022-10-22 DIAGNOSIS — Z79899 Other long term (current) drug therapy: Secondary | ICD-10-CM

## 2022-10-22 DIAGNOSIS — I4819 Other persistent atrial fibrillation: Secondary | ICD-10-CM | POA: Diagnosis not present

## 2022-10-22 DIAGNOSIS — I480 Paroxysmal atrial fibrillation: Secondary | ICD-10-CM

## 2022-10-22 NOTE — Progress Notes (Signed)
Electrophysiology Office Note:    Date:  10/22/2022   ID:  Joshua Lowe, DOB 12-Nov-1955, MRN [id number]  PCP:  Georgann Housekeeper, MD  [facility] Cardiologist:  Kristeen Miss, MD  [facility] Electrophysiologist:  Lanier Prude, MD   Referring MD: Georgann Housekeeper, MD   Chief Complaint: Afib  History of Present Illness:    Joshua Lowe is a 67 y.o. male who presents for follow-up. His past medical history includes Graves disease, s/p ablation, and persistent Afib since Spring 2020. He underwent unsuccessful cardioversion 09/01/19. He had Tikosyn admission 10/25/2019 with subsequent successful cardioversion 10/27/19.  He was previously a patient of Dr. Johney Frame, last seen by him 12/2020. His EKG at that visit showed shows sinus, qtc 427 msec. He was maintaining sinus rhythm on Tikosyn. Chads2vas score 0, did not require OAC therapy. Cardiac CT 1/22 without evidence of CAD.  Followed up with Dr. [name] 04/2022 for preop clearance for right knee replacement with Dr. Despina Hick. He denied any further episodes of PAF since starting tikosyn.  Today, he is accompanied by a family member. He states his Afib has been controlled on tikosyn 375 mcg daily. He denies any recurrent arrhythmic episodes. He confirms that he is not on anticoagulation or 81 mg ASA.  He denies any palpitations, chest pain, shortness of breath, or peripheral edema. No lightheadedness, headaches, syncope, orthopnea, or PND.  Past medical, family, social, surgical histories reviewed.  ROS:   Please see the history of present illness.    All other systems reviewed and are negative.  EKGs/Labs/Other Studies Reviewed:    The following studies were reviewed today:   EKG:   EKG is personally reviewed.  10/22/2022: Sinus rhythm.  QTc is 422 ms.  Physical Exam:    VS:  BP 126/72   Pulse 62   Ht 6' (1.829 m)   Wt 213 lb (96.6 kg)   SpO2 97%   BMI 28.89 kg/m     Wt Readings from Last 3 Encounters:  10/22/22 213 lb  (96.6 kg)  06/02/22 196 lb 3.4 oz (89 kg)  05/27/22 198 lb (89.8 kg)     GEN: Well nourished, well developed in no acute distress CARDIAC: RRR, no murmurs, rubs, gallops RESPIRATORY:  Clear to auscultation without rales, wheezing or rhonchi       ASSESSMENT:    1. Persistent atrial fibrillation   2. Encounter for long-term (current) use of high-risk medication    PLAN:    In order of problems listed above:  #Persistent atrial fibrillation Doing well on Tikosyn.  QTc acceptable for continued use.  Repeat BMP and magnesium today. Follow-up with an APP in 6 months.     Medication Adjustments/Labs and Tests Ordered: Current medicines are reviewed at length with the patient today.  Concerns regarding medicines are outlined above.   Orders Placed This Encounter  Procedures   Basic Metabolic Panel (BMET)   Magnesium   EKG 12-Lead   No orders of the defined types were placed in this encounter.   I,Mathew Stumpf,acting as a Neurosurgeon for Lanier Prude, MD.,have documented all relevant documentation on the behalf of Lanier Prude, MD,as directed by  Lanier Prude, MD while in the presence of Lanier Prude, MD.  I, Lanier Prude, MD, have reviewed all documentation for this visit. The documentation on 10/22/22 for the exam, diagnosis, procedures, and orders are all accurate and complete.  Signed, Rossie Muskrat. Lalla Brothers, MD, [facility], [facility] 10/22/2022 7:14 PM  Electrophysiology [facility]

## 2022-10-22 NOTE — Patient Instructions (Addendum)
Medication Instructions:  Your physician recommends that you continue on your current medications as directed. Please refer to the Current Medication list given to you today.  *If you need a refill on your cardiac medications before your next appointment, please call your pharmacy*  Lab Work: You will have a BMP and Magnesium level blood draw today, 10/22/2022; Order by Dr. Steffanie Dunn.  Testing/Procedures: None ordered.  Follow-Up: At [facility], you and your health needs are our priority.  As part of our continuing mission to provide you with exceptional heart care, we have created designated Provider Care Teams.  These Care Teams include your primary Cardiologist (physician) and Advanced Practice Providers (APPs -  Physician Assistants and Nurse Practitioners) who all work together to provide you with the care you need, when you need it.   Your next appointment:   Please schedule a 6 month follow up appointment with an EP APP, per Dr. Lalla Brothers.   The format for your next appointment:   In Person  Provider:   Steffanie Dunn, MD{or one of the following Advanced Practice Providers on your designated Care Team:         ]

## 2022-10-23 ENCOUNTER — Other Ambulatory Visit: Payer: Self-pay | Admitting: Nurse Practitioner

## 2022-10-23 DIAGNOSIS — B181 Chronic viral hepatitis B without delta-agent: Secondary | ICD-10-CM

## 2022-10-23 DIAGNOSIS — K760 Fatty (change of) liver, not elsewhere classified: Secondary | ICD-10-CM

## 2022-10-23 DIAGNOSIS — K7401 Hepatic fibrosis, early fibrosis: Secondary | ICD-10-CM

## 2022-10-23 LAB — BASIC METABOLIC PANEL
BUN/Creatinine Ratio: 15 (ref 10–24)
BUN: 13 mg/dL (ref 8–27)
CO2: 23 mmol/L (ref 20–29)
Calcium: 9.2 mg/dL (ref 8.6–10.2)
Chloride: 102 mmol/L (ref 96–106)
Creatinine, Ser: 0.86 mg/dL (ref 0.76–1.27)
Glucose: 84 mg/dL (ref 70–99)
Potassium: 4.1 mmol/L (ref 3.5–5.2)
Sodium: 140 mmol/L (ref 134–144)
eGFR: 97 mL/min/{1.73_m2} (ref >59)

## 2022-10-23 LAB — MAGNESIUM: Magnesium: 2 mg/dL (ref 1.6–2.3)

## 2022-11-10 ENCOUNTER — Ambulatory Visit
Admission: RE | Admit: 2022-11-10 | Discharge: 2022-11-10 | Disposition: A | Discharge status: Home / Self Care | Payer: BC Managed Care – PPO | Source: Ambulatory Visit | Attending: Nurse Practitioner | Admitting: Nurse Practitioner

## 2022-11-10 DIAGNOSIS — B181 Chronic viral hepatitis B without delta-agent: Secondary | ICD-10-CM | POA: Diagnosis not present

## 2022-11-10 DIAGNOSIS — K7401 Hepatic fibrosis, early fibrosis: Secondary | ICD-10-CM

## 2022-11-10 DIAGNOSIS — K760 Fatty (change of) liver, not elsewhere classified: Secondary | ICD-10-CM

## 2023-01-24 IMAGING — US US ABDOMEN LIMITED
1 series · 14 of 25 positions shown · non-contrast
Comparison: Ultrasound dated 03/25/2021.

CLINICAL DATA: Chronic hepatitis-B.

EXAM:
ULTRASOUND ABDOMEN LIMITED RIGHT UPPER QUADRANT

[Series 1: us abdomen limited · 0.23mm/px · 14 of 53 slices shown]
[im 1/53]
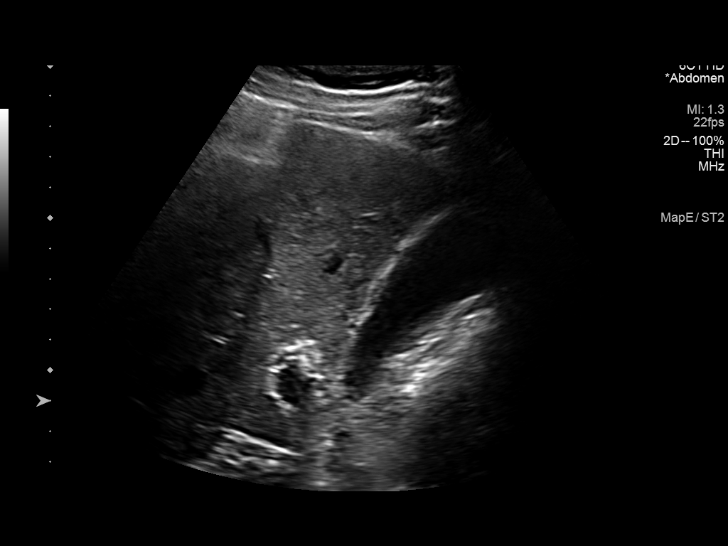
[im 5/53]
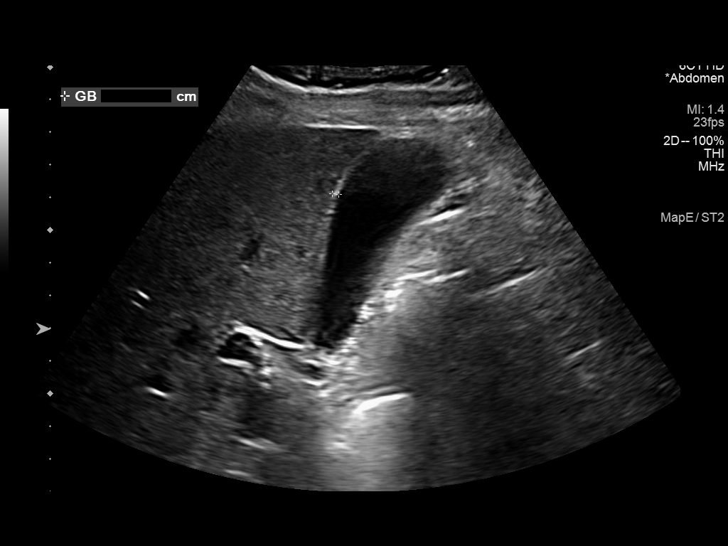
[im 9/53]
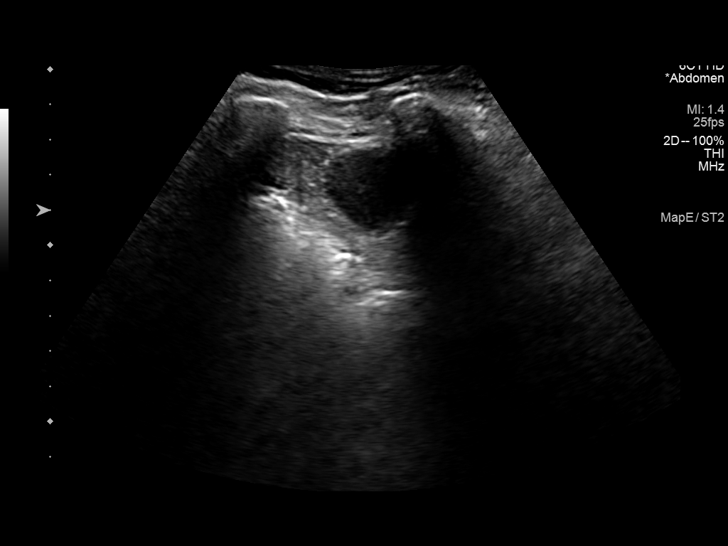
[im 14/53]
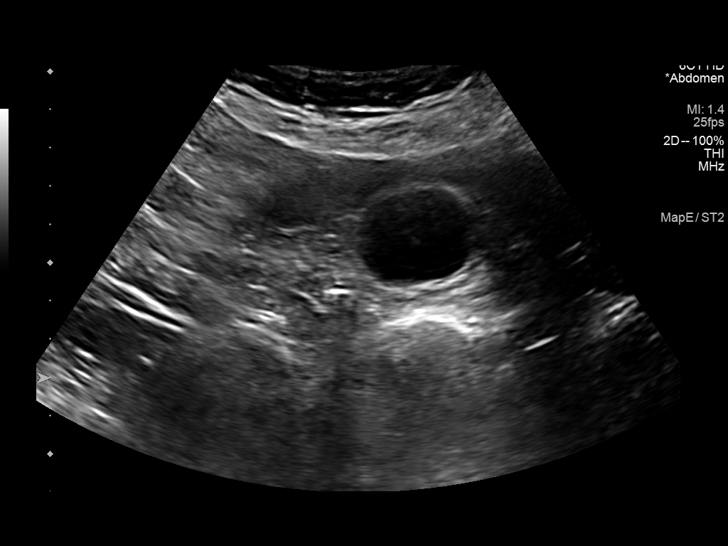
[im 18/53]
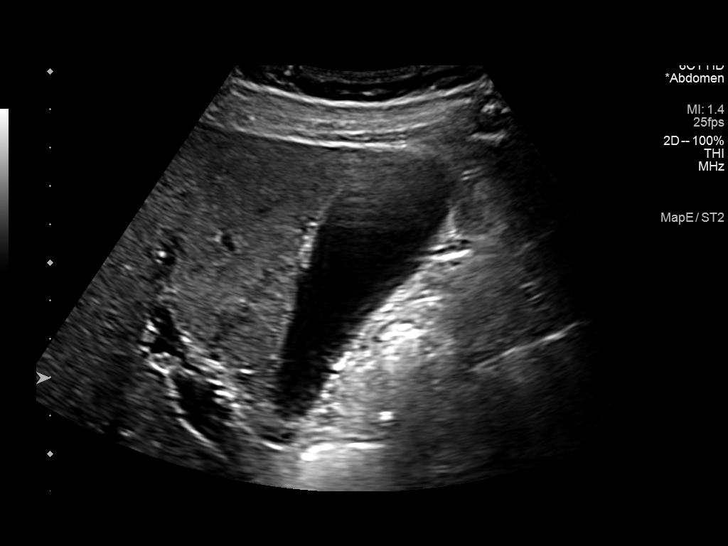
[im 20/53]
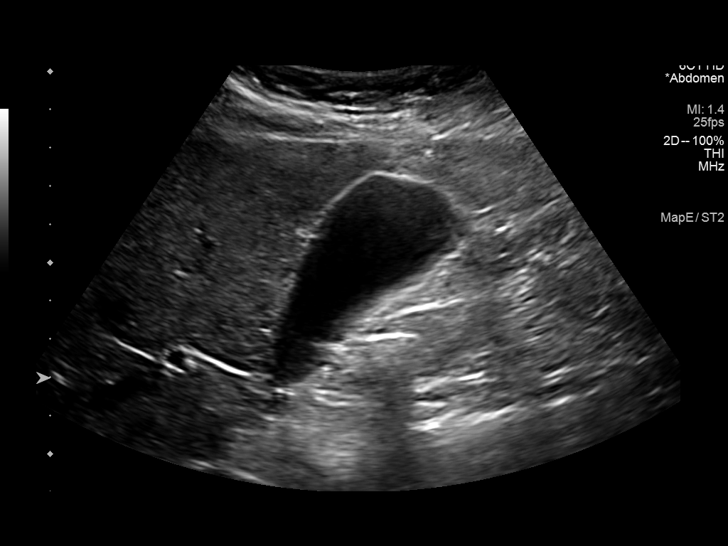
[im 24/53]
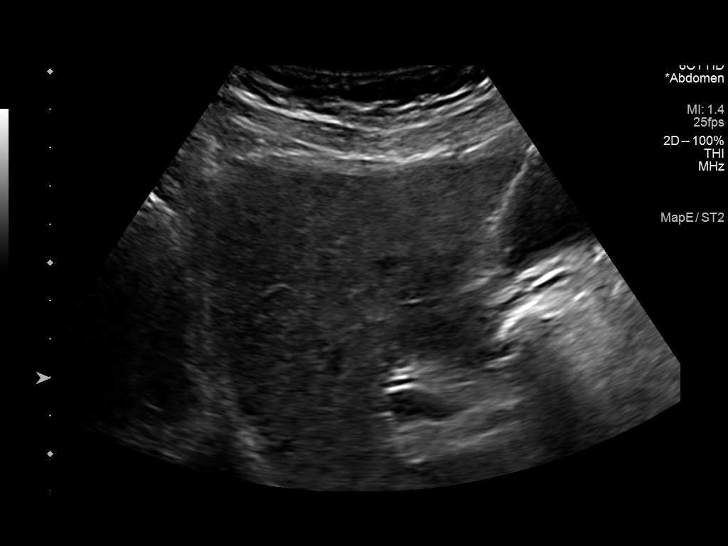
[im 29/53]
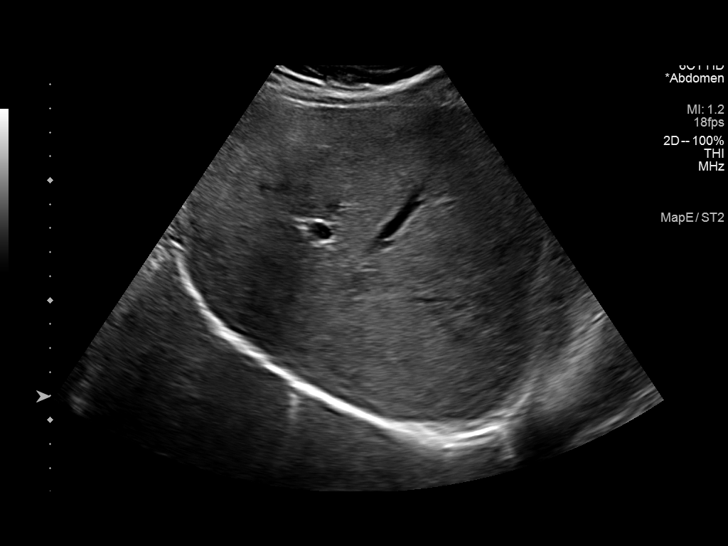
[im 33/53]
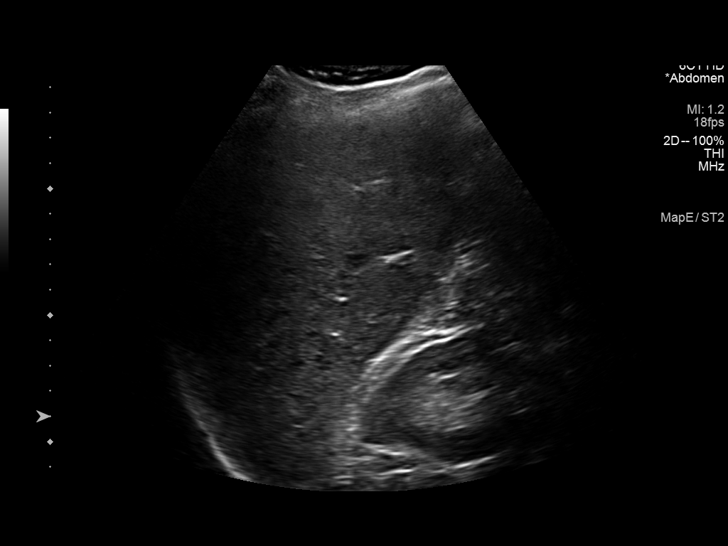
[im 35/53]
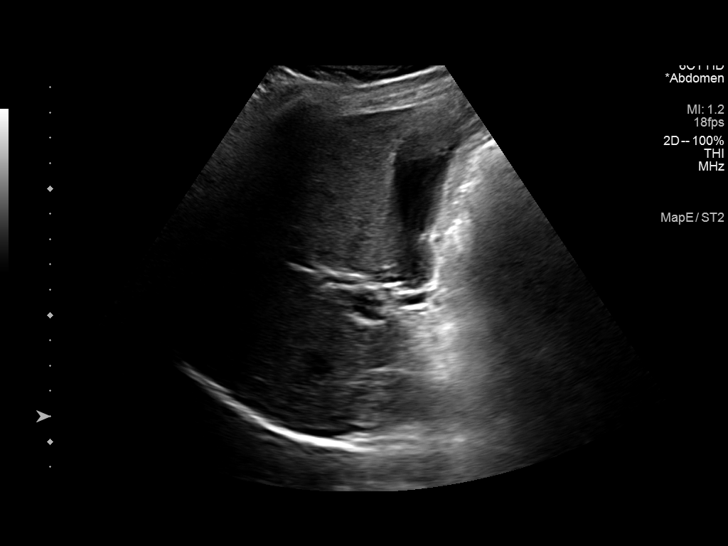
[im 40/53]
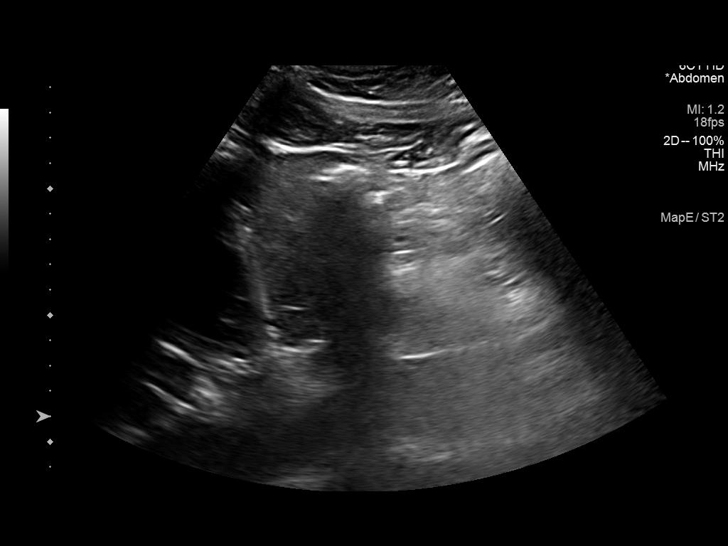
[im 44/53]
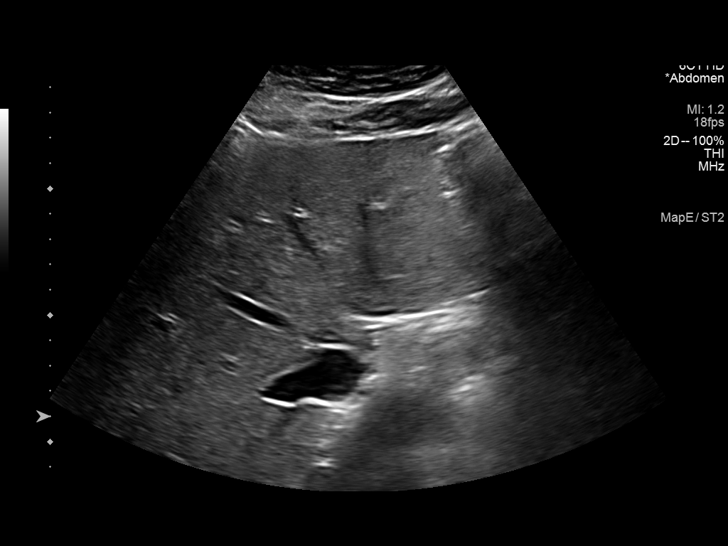
[im 48/53]
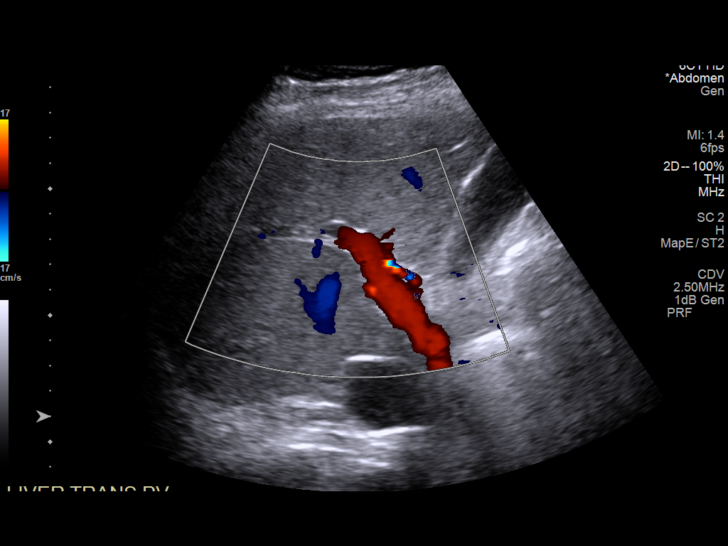
[im 53/53]
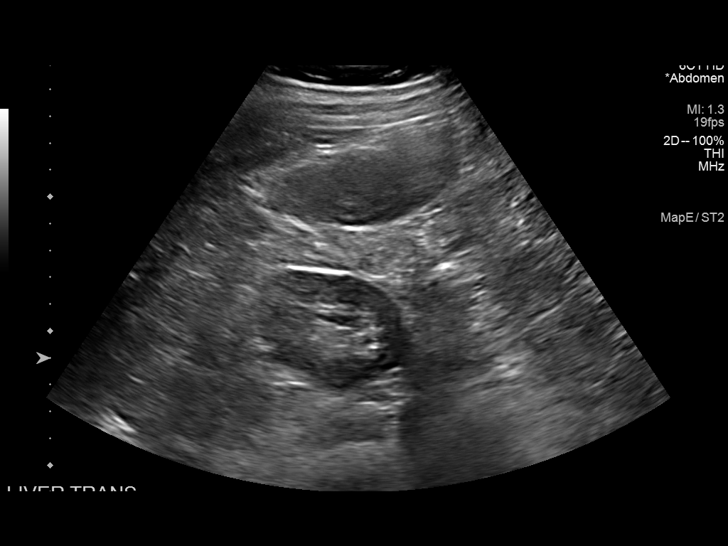

[14 of 25 positions shown; findings below may reference images not displayed]

FINDINGS: Gallbladder:

No gallstones or wall thickening visualized. No sonographic Murphy
sign noted by sonographer.

Common bile duct:

Diameter: 2 mm

Liver:

No focal lesion identified. Within normal limits in parenchymal
echogenicity. Portal vein is patent on color Doppler imaging with
normal direction of blood flow towards the liver.

Other: None.
IMPRESSION: Unremarkable right upper quadrant ultrasound.

## 2023-02-23 ENCOUNTER — Other Ambulatory Visit (HOSPITAL_COMMUNITY): Payer: Self-pay | Admitting: *Deleted

## 2023-02-23 MED ORDER — DOFETILIDE 125 MCG PO CAPS: ORAL_CAPSULE | ORAL | 1 refills | Status: DC

## 2023-02-23 MED ORDER — DOFETILIDE 250 MCG PO CAPS: 250.0000 ug | ORAL_CAPSULE | Freq: Two times a day (BID) | ORAL | 1 refills | Status: DC

## 2023-03-11 DIAGNOSIS — M7701 Medial epicondylitis, right elbow: Secondary | ICD-10-CM | POA: Diagnosis not present

## 2023-03-18 DIAGNOSIS — M25521 Pain in right elbow: Secondary | ICD-10-CM | POA: Diagnosis not present

## 2023-04-01 DIAGNOSIS — M25521 Pain in right elbow: Secondary | ICD-10-CM | POA: Diagnosis not present

## 2023-04-22 ENCOUNTER — Other Ambulatory Visit: Payer: Self-pay | Admitting: Nurse Practitioner

## 2023-04-22 DIAGNOSIS — B181 Chronic viral hepatitis B without delta-agent: Secondary | ICD-10-CM

## 2023-04-22 DIAGNOSIS — K760 Fatty (change of) liver, not elsewhere classified: Secondary | ICD-10-CM | POA: Diagnosis not present

## 2023-04-22 DIAGNOSIS — K7401 Hepatic fibrosis, early fibrosis: Secondary | ICD-10-CM | POA: Diagnosis not present

## 2023-04-29 ENCOUNTER — Encounter: Payer: Self-pay | Admitting: Physician Assistant

## 2023-04-29 ENCOUNTER — Ambulatory Visit: Payer: BC Managed Care – PPO | Attending: Physician Assistant | Admitting: Physician Assistant

## 2023-04-29 VITALS — BP 104/64 | HR 64 | Ht 72.0 in | Wt 209.2 lb

## 2023-04-29 DIAGNOSIS — I4819 Other persistent atrial fibrillation: Secondary | ICD-10-CM | POA: Diagnosis not present

## 2023-04-29 DIAGNOSIS — E785 Hyperlipidemia, unspecified: Secondary | ICD-10-CM | POA: Diagnosis not present

## 2023-04-29 NOTE — Progress Notes (Signed)
Cardiology Office Note:  .   Date:  04/29/2023  ID:  Joshua Lowe, DOB Jun 13, 1956, MRN [id number] PCP: Georgann Housekeeper, MD  [facility] Providers Cardiologist:  Kristeen Miss, MD Electrophysiologist:  Lanier Prude, MD {  History of Present Illness: Joshua Lowe   Joshua Lowe is a 67 y.o. male with Afib, hypothyroidism, hyperlipidemia, chronic back pain here for follow-up appointment.  Has had palpitations off and on for years.  Had hypothyroidism and radioactive iodine.  Labs in head and surgery in March and preop EKG showed atrial fibrillation.  Prescribed Eliquis but had not taken it.  Heart irregular/.  He has not had any chest pain.  Some DOE when walking up stairs.  Very fatigued in the morning and takes an hour to wake up.  Was seen October 2023.  He presented with [name], retail.  He was there for preop visit for right knee replacement.  CHA2DS2-VASc score 0.  No episodes of PAF since starting Tikosyn.  Mag level and potassium levels were normal.  He was considered at low risk for upcoming knee procedure.  Today, The patient, with a history of atrial fibrillation and ankylosing spondylitis, presents for a routine follow-up. He reports no new health issues over the past year. He has been on Tikosyn for his atrial fibrillation for a long time and has not noticed any symptoms from the Afib. He is currently taking two pills, a 125mg  and a 250mg  in the morning and then again in the evening. He has been managing his cholesterol levels through diet and has not been taking any medication for it. His LDL was 106, triglycerides were 72, HDL was 52, and total cholesterol was 171 a year ago. He also has ankylosing spondylitis, which has been managed with oxycodone. He reports some fused discs in the back and neck but states that it is currently manageable.   Results LABS LDL: 106 mg/dL (78/29/5621) Triglycerides: 72 mg/dL (30/86/5784) HDL: 52 mg/dL (69/62/9528) Total cholesterol: 171 mg/dL  (41/32/4401)  Consent for use of a medical recording device was obtained.   Studies Reviewed: Joshua Lowe       Coronary CT scan 07/26/2020 IMPRESSION: 1. There is normal pulmonary vein drainage into the left atrium.   2. The left atrial appendage is a large chicken wing type with two lobes and ostial size 20.0 x 28.1 mm and length 35 mm. There is no thrombus in the left atrial appendage.   3. The esophagus runs in the left atrial midline and is not in the proximity to any of the pulmonary veins.   4.  No coronary calcification.   Risk Assessment/Calculations:    CHA2DS2-VASc Score = 0   This indicates a 0.2% annual risk of stroke. The patient's score is based upon: CHF History: 0 HTN History: 0 Diabetes History: 0 Stroke History: 0 Vascular Disease History: 0 Age Score: 0 Gender Score: 0             Physical Exam:   VS:  BP 104/64   Pulse 64   Ht 6' (1.829 m)   Wt 209 lb 3.2 oz (94.9 kg)   SpO2 96%   BMI 28.37 kg/m    Wt Readings from Last 3 Encounters:  04/29/23 209 lb 3.2 oz (94.9 kg)  10/22/22 213 lb (96.6 kg)  06/02/22 196 lb 3.4 oz (89 kg)    GEN: Well nourished, well developed in no acute distress NECK: No JVD; No carotid bruits CARDIAC: RRR, no murmurs,  rubs, gallops RESPIRATORY:  Clear to auscultation without rales, wheezing or rhonchi  ABDOMEN: Soft, non-tender, non-distended EXTREMITIES:  No edema; No deformity, varicose veins present  ASSESSMENT AND PLAN: .    Atrial Fibrillation Stable on Tikosyn 375mg  BID. No reported symptoms of arrhythmia. -Continue Tikosyn 375mg  BID.   Hyperlipidemia LDL slightly elevated at 106, but patient is not on any lipid-lowering medications. Patient reports attempting to adhere to a lipid-lowering diet. -Continue dietary management.  Ankylosing Spondylitis Chronic pain managed with Oxycodone. No recent exacerbations reported. -Continue Oxycodone as prescribed.  Dependent Edema Patient reports occasional swelling  in lower extremities, likely due to dependent edema. No signs of heart failure. -Advise patient to elevate legs and consider wearing compression socks if swelling persists.   Dispo: He can follow-up in 6 months with Dr. [name]  Signed, Sharlene Dory, PA-C

## 2023-04-29 NOTE — Patient Instructions (Signed)
Medication Instructions:  Your physician recommends that you continue on your current medications as directed. Please refer to the Current Medication list given to you today.  *If you need a refill on your cardiac medications before your next appointment, please call your pharmacy*  Lab Work: None ordered If you have labs (blood work) drawn today and your tests are completely normal, you will receive your results only by: MyChart Message (if you have MyChart) OR A paper copy in the mail If you have any lab test that is abnormal or we need to change your treatment, we will call you to review the results.  Follow-Up: At [facility], you and your health needs are our priority.  As part of our continuing mission to provide you with exceptional heart care, we have created designated Provider Care Teams.  These Care Teams include your primary Cardiologist (physician) and Advanced Practice Providers (APPs -  Physician Assistants and Nurse Practitioners) who all work together to provide you with the care you need, when you need it.  Your next appointment:   6 month(s)  Provider:   Kristeen Miss, MD    Low-Sodium Eating Plan Salt (sodium) helps you keep a healthy balance of fluids in your body. Too much sodium can raise your blood pressure. It can also cause fluid and waste to be held in your body. Your health care provider or dietitian may recommend a low-sodium eating plan if you have high blood pressure (hypertension), kidney disease, liver disease, or heart failure. Eating less sodium can help lower your blood pressure and reduce swelling. It can also protect your heart, liver, and kidneys. What are tips for following this plan? Reading food labels  Check food labels for the amount of sodium per serving. If you eat more than one serving, you must multiply the listed amount by the number of servings. Choose foods with less than 140 milligrams (mg) of sodium per serving. Avoid foods  with 300 mg of sodium or more per serving. Always check how much sodium is in a product, even if the label says "unsalted" or "no salt added." Shopping  Buy products labeled as "low-sodium" or "no salt added." Buy fresh foods. Avoid canned foods and pre-made or frozen meals. Avoid canned, cured, or processed meats. Buy breads that have less than 80 mg of sodium per slice. Cooking  Eat more home-cooked food. Try to eat less restaurant, buffet, and fast food. Try not to add salt when you cook. Use salt-free seasonings or herbs instead of table salt or sea salt. Check with your provider or pharmacist before using salt substitutes. Cook with plant-based oils, such as canola, sunflower, or olive oil. Meal planning When eating at a restaurant, ask if your food can be made with less salt or no salt. Avoid dishes labeled as brined, pickled, cured, or smoked. Avoid dishes made with soy sauce, miso, or teriyaki sauce. Avoid foods that have monosodium glutamate (MSG) in them. MSG may be added to some restaurant food, sauces, soups, bouillon, and canned foods. Make meals that can be grilled, baked, poached, roasted, or steamed. These are often made with less sodium. General information Try to limit your sodium intake to 1,500-2,300 mg each day, or the amount told by your provider. What foods should I eat? Fruits Fresh, frozen, or canned fruit. Fruit juice. Vegetables Fresh or frozen vegetables. "No salt added" canned vegetables. "No salt added" tomato sauce and paste. Low-sodium or reduced-sodium tomato and vegetable juice. Grains Low-sodium cereals, such  as oats, puffed wheat and rice, and shredded wheat. Low-sodium crackers. Unsalted rice. Unsalted pasta. Low-sodium bread. Whole grain breads and whole grain pasta. Meats and other proteins Fresh or frozen meat, poultry, seafood, and fish. These should have no added salt. Low-sodium canned tuna and salmon. Unsalted nuts. Dried peas, beans, and  lentils without added salt. Unsalted canned beans. Eggs. Unsalted nut butters. Dairy Milk. Soy milk. Cheese that is naturally low in sodium, such as ricotta cheese, fresh mozzarella, or Swiss cheese. Low-sodium or reduced-sodium cheese. Cream cheese. Yogurt. Seasonings and condiments Fresh and dried herbs and spices. Salt-free seasonings. Low-sodium mustard and ketchup. Sodium-free salad dressing. Sodium-free light mayonnaise. Fresh or refrigerated horseradish. Lemon juice. Vinegar. Other foods Homemade, reduced-sodium, or low-sodium soups. Unsalted popcorn and pretzels. Low-salt or salt-free chips. The items listed above may not be all the foods and drinks you can have. Talk to a dietitian to learn more. What foods should I avoid? Vegetables Sauerkraut, pickled vegetables, and relishes. Olives. [place] fries. Onion rings. Regular canned vegetables, except low-sodium or reduced-sodium items. Regular canned tomato sauce and paste. Regular tomato and vegetable juice. Frozen vegetables in sauces. Grains Instant hot cereals. Bread stuffing, pancake, and biscuit mixes. Croutons. Seasoned rice or pasta mixes. Noodle soup cups. Boxed or frozen macaroni and cheese. Regular salted crackers. Self-rising flour. Meats and other proteins Meat or fish that is salted, canned, smoked, spiced, or pickled. Precooked or cured meat, such as sausages or meat loaves. Tomasa Blase. Ham. Pepperoni. Hot dogs. Corned beef. Chipped beef. Salt pork. Jerky. Pickled herring, anchovies, and sardines. Regular canned tuna. Salted nuts. Dairy Processed cheese and cheese spreads. Hard cheeses. Cheese curds. Blue cheese. Feta cheese. String cheese. Regular cottage cheese. Buttermilk. Canned milk. Fats and oils Salted butter. Regular margarine. Ghee. Bacon fat. Seasonings and condiments Onion salt, garlic salt, seasoned salt, table salt, and sea salt. Canned and packaged gravies. Worcestershire sauce. Tartar sauce. Barbecue sauce. Teriyaki  sauce. Soy sauce, including reduced-sodium soy sauce. Steak sauce. Fish sauce. Oyster sauce. Cocktail sauce. Horseradish that you find on the shelf. Regular ketchup and mustard. Meat flavorings and tenderizers. Bouillon cubes. Hot sauce. Pre-made or packaged marinades. Pre-made or packaged taco seasonings. Relishes. Regular salad dressings. Salsa. Other foods Salted popcorn and pretzels. Corn chips and puffs. Potato and tortilla chips. Canned or dried soups. Pizza. Frozen entrees and pot pies. The items listed above may not be all the foods and drinks you should avoid. Talk to a dietitian to learn more. This information is not intended to replace advice given to you by your health care provider. Make sure you discuss any questions you have with your health care provider. Document Revised: 07/10/2022 Document Reviewed: 07/10/2022 Elsevier Patient Education  2024 [employer] Heart-Healthy Eating Plan Many factors influence your heart health, including eating and exercise habits. Heart health is also called coronary health. Coronary risk increases with abnormal blood fat (lipid) levels. A heart-healthy eating plan includes limiting unhealthy fats, increasing healthy fats, limiting salt (sodium) intake, and making other diet and lifestyle changes. What is my plan? Your health care provider may recommend that: You limit your fat intake to _________% or less of your total calories each day. You limit your saturated fat intake to _________% or less of your total calories each day. You limit the amount of cholesterol in your diet to less than _________ mg per day. You limit the amount of sodium in your diet to less than _________ mg per day. What are tips for following this plan?  Cooking Cook foods using methods other than frying. Baking, boiling, grilling, and broiling are all good options. Other ways to reduce fat include: Removing the skin from poultry. Removing all visible fats from  meats. Steaming vegetables in water or broth. Meal planning  At meals, imagine dividing your plate into fourths: Fill one-half of your plate with vegetables and green salads. Fill one-fourth of your plate with whole grains. Fill one-fourth of your plate with lean protein foods. Eat 2-4 cups of vegetables per day. One cup of vegetables equals 1 cup (91 g) broccoli or cauliflower florets, 2 medium carrots, 1 large bell pepper, 1 large sweet potato, 1 large tomato, 1 medium white potato, 2 cups (150 g) raw leafy greens. Eat 1-2 cups of fruit per day. One cup of fruit equals 1 small apple, 1 large banana, 1 cup (237 g) mixed fruit, 1 large orange,  cup (82 g) dried fruit, 1 cup (240 mL) 100% fruit juice. Eat more foods that contain soluble fiber. Examples include apples, broccoli, carrots, beans, peas, and barley. Aim to get 25-30 g of fiber per day. Increase your consumption of legumes, nuts, and seeds to 4-5 servings per week. One serving of dried beans or legumes equals  cup (90 g) cooked, 1 serving of nuts is  oz (12 almonds, 24 pistachios, or 7 walnut halves), and 1 serving of seeds equals  oz (8 g). Fats Choose healthy fats more often. Choose monounsaturated and polyunsaturated fats, such as olive and canola oils, avocado oil, flaxseeds, walnuts, almonds, and seeds. Eat more omega-3 fats. Choose salmon, mackerel, sardines, tuna, flaxseed oil, and ground flaxseeds. Aim to eat fish at least 2 times each week. Check food labels carefully to identify foods with trans fats or high amounts of saturated fat. Limit saturated fats. These are found in animal products, such as meats, butter, and cream. Plant sources of saturated fats include palm oil, palm kernel oil, and coconut oil. Avoid foods with partially hydrogenated oils in them. These contain trans fats. Examples are stick margarine, some tub margarines, cookies, crackers, and other baked goods. Avoid fried foods. General information Eat  more home-cooked food and less restaurant, buffet, and fast food. Limit or avoid alcohol. Limit foods that are high in added sugar and simple starches such as foods made using white refined flour (white breads, pastries, sweets). Lose weight if you are overweight. Losing just 5-10% of your body weight can help your overall health and prevent diseases such as diabetes and heart disease. Monitor your sodium intake, especially if you have high blood pressure. Talk with your health care provider about your sodium intake. Try to incorporate more vegetarian meals weekly. What foods should I eat? Fruits All fresh, canned (in natural juice), or frozen fruits. Vegetables Fresh or frozen vegetables (raw, steamed, roasted, or grilled). Green salads. Grains Most grains. Choose whole wheat and whole grains most of the time. Rice and pasta, including brown rice and pastas made with whole wheat. Meats and other proteins Lean, well-trimmed beef, veal, pork, and lamb. Chicken and [place] without skin. All fish and shellfish. Wild duck, rabbit, pheasant, and venison. Egg whites or low-cholesterol egg substitutes. Dried beans, peas, lentils, and tofu. Seeds and most nuts. Dairy Low-fat or nonfat cheeses, including ricotta and mozzarella. Skim or 1% milk (liquid, powdered, or evaporated). Buttermilk made with low-fat milk. Nonfat or low-fat yogurt. Fats and oils Non-hydrogenated (trans-free) margarines. Vegetable oils, including soybean, sesame, sunflower, olive, avocado, peanut, safflower, corn, canola, and cottonseed. Salad dressings or  mayonnaise made with a vegetable oil. Beverages Water (mineral or sparkling). Coffee and tea. Unsweetened ice tea. Diet beverages. Sweets and desserts Sherbet, gelatin, and fruit ice. Small amounts of dark chocolate. Limit all sweets and desserts. Seasonings and condiments All seasonings and condiments. The items listed above may not be a complete list of foods and beverages  you can eat. Contact a dietitian for more options. What foods should I avoid? Fruits Canned fruit in heavy syrup. Fruit in cream or butter sauce. Fried fruit. Limit coconut. Vegetables Vegetables cooked in cheese, cream, or butter sauce. Fried vegetables. Grains Breads made with saturated or trans fats, oils, or whole milk. Croissants. Sweet rolls. Donuts. High-fat crackers, such as cheese crackers and chips. Meats and other proteins Fatty meats, such as hot dogs, ribs, sausage, bacon, rib-eye roast or steak. High-fat deli meats, such as salami and bologna. Caviar. Domestic duck and goose. Organ meats, such as liver. Dairy Cream, sour cream, cream cheese, and creamed cottage cheese. Whole-milk cheeses. Whole or 2% milk (liquid, evaporated, or condensed). Whole buttermilk. Cream sauce or high-fat cheese sauce. Whole-milk yogurt. Fats and oils Meat fat, or shortening. Cocoa butter, hydrogenated oils, palm oil, coconut oil, palm kernel oil. Solid fats and shortenings, including bacon fat, salt pork, lard, and butter. Nondairy cream substitutes. Salad dressings with cheese or sour cream. Beverages Regular sodas and any drinks with added sugar. Sweets and desserts Frosting. Pudding. Cookies. Cakes. Pies. Milk chocolate or white chocolate. Buttered syrups. Full-fat ice cream or ice cream drinks. The items listed above may not be a complete list of foods and beverages to avoid. Contact a dietitian for more information. Summary Heart-healthy meal planning includes limiting unhealthy fats, increasing healthy fats, limiting salt (sodium) intake and making other diet and lifestyle changes. Lose weight if you are overweight. Losing just 5-10% of your body weight can help your overall health and prevent diseases such as diabetes and heart disease. Focus on eating a balance of foods, including fruits and vegetables, low-fat or nonfat dairy, lean protein, nuts and legumes, whole grains, and heart-healthy oils  and fats. This information is not intended to replace advice given to you by your health care provider. Make sure you discuss any questions you have with your health care provider. Document Revised: 07/29/2021 Document Reviewed: 07/29/2021 Elsevier Patient Education  2024 [employer].

## 2023-04-30 ENCOUNTER — Encounter: Payer: Self-pay | Admitting: Physician Assistant

## 2023-04-30 ENCOUNTER — Telehealth: Payer: Self-pay | Admitting: Physician Assistant

## 2023-04-30 NOTE — Telephone Encounter (Signed)
*  STAT* If patient is at the pharmacy, call can be transferred to refill team.   1. Which medications need to be refilled? (please list name of each medication and dose if known)  dofetilide (TIKOSYN) 250 MCG capsule dofetilide (TIKOSYN) 125 MCG capsule  2. Which pharmacy/location (including street and city if local pharmacy) is medication to be sent to? [id number] - [place], [place] - [address] AT [address] [facility] [address] [address]   3. Do they need a 30 day or 90 day supply?  90 day supply  Wife states these medications were supposed to be sent to pharmacy after appointment with Jari Favre, PA, but the pharmacy has not received them.

## 2023-05-01 DIAGNOSIS — Z0000 Encounter for general adult medical examination without abnormal findings: Secondary | ICD-10-CM | POA: Diagnosis not present

## 2023-05-01 DIAGNOSIS — E039 Hypothyroidism, unspecified: Secondary | ICD-10-CM | POA: Diagnosis not present

## 2023-05-01 DIAGNOSIS — M109 Gout, unspecified: Secondary | ICD-10-CM | POA: Diagnosis not present

## 2023-05-01 DIAGNOSIS — R7303 Prediabetes: Secondary | ICD-10-CM | POA: Diagnosis not present

## 2023-05-01 MED ORDER — DOFETILIDE 250 MCG PO CAPS: 250.0000 ug | ORAL_CAPSULE | Freq: Two times a day (BID) | ORAL | 1 refills | Status: DC

## 2023-05-01 MED ORDER — DOFETILIDE 125 MCG PO CAPS: ORAL_CAPSULE | ORAL | 1 refills | Status: DC

## 2023-05-06 ENCOUNTER — Ambulatory Visit
Admission: RE | Admit: 2023-05-06 | Discharge: 2023-05-06 | Disposition: A | Discharge status: Home / Self Care | Payer: BC Managed Care – PPO | Source: Ambulatory Visit | Attending: Nurse Practitioner | Admitting: Nurse Practitioner

## 2023-05-06 DIAGNOSIS — K760 Fatty (change of) liver, not elsewhere classified: Secondary | ICD-10-CM

## 2023-05-06 DIAGNOSIS — Z1159 Encounter for screening for other viral diseases: Secondary | ICD-10-CM | POA: Diagnosis not present

## 2023-05-06 DIAGNOSIS — B181 Chronic viral hepatitis B without delta-agent: Secondary | ICD-10-CM

## 2023-05-06 DIAGNOSIS — K7401 Hepatic fibrosis, early fibrosis: Secondary | ICD-10-CM

## 2023-07-23 DIAGNOSIS — Z96651 Presence of right artificial knee joint: Secondary | ICD-10-CM | POA: Diagnosis not present

## 2023-08-20 DIAGNOSIS — Z96651 Presence of right artificial knee joint: Secondary | ICD-10-CM | POA: Diagnosis not present

## 2023-08-20 DIAGNOSIS — M25461 Effusion, right knee: Secondary | ICD-10-CM | POA: Diagnosis not present

## 2023-09-11 DIAGNOSIS — M545 Low back pain, unspecified: Secondary | ICD-10-CM | POA: Diagnosis not present

## 2023-09-11 DIAGNOSIS — M5431 Sciatica, right side: Secondary | ICD-10-CM | POA: Diagnosis not present

## 2023-09-21 DIAGNOSIS — M545 Low back pain, unspecified: Secondary | ICD-10-CM | POA: Diagnosis not present

## 2023-09-21 DIAGNOSIS — M5431 Sciatica, right side: Secondary | ICD-10-CM | POA: Diagnosis not present

## 2023-09-23 DIAGNOSIS — M545 Low back pain, unspecified: Secondary | ICD-10-CM | POA: Diagnosis not present

## 2023-09-23 DIAGNOSIS — M5431 Sciatica, right side: Secondary | ICD-10-CM | POA: Diagnosis not present

## 2023-09-28 DIAGNOSIS — M545 Low back pain, unspecified: Secondary | ICD-10-CM | POA: Diagnosis not present

## 2023-09-28 DIAGNOSIS — M5431 Sciatica, right side: Secondary | ICD-10-CM | POA: Diagnosis not present

## 2023-10-01 DIAGNOSIS — M5431 Sciatica, right side: Secondary | ICD-10-CM | POA: Diagnosis not present

## 2023-10-01 DIAGNOSIS — M545 Low back pain, unspecified: Secondary | ICD-10-CM | POA: Diagnosis not present

## 2023-10-06 DIAGNOSIS — M545 Low back pain, unspecified: Secondary | ICD-10-CM | POA: Diagnosis not present

## 2023-10-06 DIAGNOSIS — M5431 Sciatica, right side: Secondary | ICD-10-CM | POA: Diagnosis not present

## 2023-10-14 DIAGNOSIS — M5431 Sciatica, right side: Secondary | ICD-10-CM | POA: Diagnosis not present

## 2023-10-14 DIAGNOSIS — M545 Low back pain, unspecified: Secondary | ICD-10-CM | POA: Diagnosis not present

## 2023-10-20 DIAGNOSIS — M545 Low back pain, unspecified: Secondary | ICD-10-CM | POA: Diagnosis not present

## 2023-10-20 DIAGNOSIS — M5431 Sciatica, right side: Secondary | ICD-10-CM | POA: Diagnosis not present

## 2023-10-21 ENCOUNTER — Other Ambulatory Visit: Payer: Self-pay | Admitting: Nurse Practitioner

## 2023-10-21 DIAGNOSIS — B181 Chronic viral hepatitis B without delta-agent: Secondary | ICD-10-CM

## 2023-10-21 DIAGNOSIS — K7401 Hepatic fibrosis, early fibrosis: Secondary | ICD-10-CM

## 2023-10-23 DIAGNOSIS — M545 Low back pain, unspecified: Secondary | ICD-10-CM | POA: Diagnosis not present

## 2023-10-23 DIAGNOSIS — M5431 Sciatica, right side: Secondary | ICD-10-CM | POA: Diagnosis not present

## 2023-10-26 DIAGNOSIS — M5431 Sciatica, right side: Secondary | ICD-10-CM | POA: Diagnosis not present

## 2023-10-26 DIAGNOSIS — M545 Low back pain, unspecified: Secondary | ICD-10-CM | POA: Diagnosis not present

## 2023-10-27 ENCOUNTER — Ambulatory Visit
Admission: RE | Admit: 2023-10-27 | Discharge: 2023-10-27 | Disposition: A | Discharge status: Home / Self Care | Source: Ambulatory Visit | Attending: Nurse Practitioner | Admitting: Nurse Practitioner

## 2023-10-27 DIAGNOSIS — C44629 Squamous cell carcinoma of skin of left upper limb, including shoulder: Secondary | ICD-10-CM | POA: Diagnosis not present

## 2023-10-27 DIAGNOSIS — L570 Actinic keratosis: Secondary | ICD-10-CM | POA: Diagnosis not present

## 2023-10-27 DIAGNOSIS — L821 Other seborrheic keratosis: Secondary | ICD-10-CM | POA: Diagnosis not present

## 2023-10-27 DIAGNOSIS — D225 Melanocytic nevi of trunk: Secondary | ICD-10-CM | POA: Diagnosis not present

## 2023-10-27 DIAGNOSIS — L814 Other melanin hyperpigmentation: Secondary | ICD-10-CM | POA: Diagnosis not present

## 2023-10-27 DIAGNOSIS — Z85828 Personal history of other malignant neoplasm of skin: Secondary | ICD-10-CM | POA: Diagnosis not present

## 2023-10-27 DIAGNOSIS — K7401 Hepatic fibrosis, early fibrosis: Secondary | ICD-10-CM

## 2023-10-27 DIAGNOSIS — B181 Chronic viral hepatitis B without delta-agent: Secondary | ICD-10-CM

## 2023-10-27 NOTE — Progress Notes (Unsigned)
 Cardiology Office Note:    Date:  10/28/2023   ID:  Joshua Lowe, Joshua Lowe 1955-11-10, MRN [id number]  PCP:  Joshua Mina, MD  Cardiologist:  Joshua Alert, MD  Electrophysiologist:  Joshua Byes, MD   Referring MD: Joshua Mina, MD   Chief Complaint  Patient presents with   Atrial Fibrillation         History of Present Illness:    Joshua Lowe is a 68 y.o. male with a hx of atrial fib. Can occasionally feel that his HR is abn.   Talked with wife, Joshua Lowe .     Has had this for several months . Actually had palpitatins for years but they were off and on  Had hyperthyroidism Had radioactive iodine  Now is on synthroid    Had hand surgery in March ,  Pre op ECG showed  Atrial fib . 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 Has some DOE walking up stairs and walking Is very fatigued in the am.  Takes an hour to "wak up "  years ago, he would wake up gasping  Does not snore according to wife.    Wife confirms that he is not as strong and fatiues easily .   Oct. 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   Arthritis    Body mass index 31.0-31.9, adult    Cervical disc disease    Chronic back pain     Chronic neck pain    Eosinophilic disorder    Family history of adverse reaction to anesthesia    mother had nausea   Graves' disease    H/O colonoscopy 12/2008   Heart murmur    hx of years ago not detectable now   Hyperlipidemia    Hyperthyroidism    s/p RAI ablation   Hypothyroidism    Insomnia    Lumbar back pain    Osteoporosis    Persistent atrial fibrillation (HCC) 04/12/2019   Pre-diabetes    Pruritus, unspecified    Sleep apnea    oral device   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     Past Surgical History:  Procedure Laterality Date   CARDIOVERSION N/A 09/01/2019   Procedure: CARDIOVERSION;  Surgeon: Joshua Lowe, Joshua Purple, MD;  Location: [facility] ENDOSCOPY;  Service: Cardiovascular;  Laterality: N/A;   CARDIOVERSION N/A 10/27/2019   Procedure: CARDIOVERSION;  Surgeon: Joshua Quince, MD;  Location: [facility] ENDOSCOPY;  Service: Cardiovascular;  Laterality: N/A;   FISSURECTOMY  HAND SURGERY     KNEE ARTHROSCOPY  1999/2007   right knee   LIPOMA EXCISION     back   NECK SURGERY     SHOULDER SURGERY     TOTAL KNEE ARTHROPLASTY Right 06/02/2022   Procedure: TOTAL KNEE ARTHROPLASTY;  Surgeon: Joshua Rei, MD;  Location: [facility] ORS;  Service: Orthopedics;  Laterality: Right;    Current Medications: Current Meds  Medication Sig   cetirizine (ZYRTEC) 10 MG tablet Take 10 mg by mouth daily as needed for allergies.   colchicine  0.6 MG tablet Take 1 tablet by mouth daily as needed (gout).   dofetilide  (TIKOSYN ) 125 MCG capsule TAKE 1 CAPSULE BY MOUTH TWICE DAILY; TAKE WITH THE CAPSULE TO EQUAL 375MCG TWICE DAILY   dofetilide  (TIKOSYN ) 250 MCG capsule Take 1 capsule (250 mcg total) by mouth 2 (two) times daily. Take with 125mcg dose to equal 375mcg twice a day   EPINEPHrine 0.3 mg/0.3 mL IJ SOAJ injection Inject 1 mL into the skin as needed for allergies.   levothyroxine  (SYNTHROID ) 175 MCG tablet Take 175 mcg by mouth See admin instructions. Take 175 mcg daily  except skip dose on Thursdays  Wife to bring from home due to pt takes only brand name per his PCP.   Oxycodone HCl 10 MG TABS Take 1 tablet by mouth 3 (three) times daily.   VEMLIDY  25 MG TABS Take 25 mg by mouth daily. Wife to bring from home in case pharmacy unable to supply .   zolpidem  (AMBIEN ) 10 MG tablet Take 10 mg by mouth at bedtime.     Allergies:   Bee venom   Social History   Socioeconomic History   Marital status: Married    Spouse name: Joshua Lowe   Number of children: Not on file   Years of education: Not on file   Highest education level: Not on file  Occupational History   Not on file  Tobacco Use   Smoking status: Never   Smokeless tobacco: Never  Vaping Use   Vaping status: Never Used  Substance and Sexual Activity   Alcohol use: Not Currently    Alcohol/week: 1.0 - 2.0 standard drink of alcohol    Types: 1 - 2 Cans of beer per week   Drug use: No   Sexual activity: Not Currently  Other Topics Concern   Not on file  Social History Narrative   Lives in [place] with spouse.   Self employed in [employer]   Social Drivers of Health   Financial Resource Strain: Not on file  Food Insecurity: Low Risk  (04/22/2023)   Received from [facility]   Hunger Vital Sign    Worried About Running Out of Food in the Last Year: Never true    Ran Out of Food in the Last Year: Never true  Transportation Needs: No Transportation Needs (04/22/2023)   Received from [facility]    In the past 12 months, has lack of reliable transportation kept you from medical appointments, meetings, work or from getting things needed for daily living? : No  Physical Activity: Not on file  Stress: Not on file  Social Connections: Not on file     Family History: The patient's family history includes Atrial fibrillation in his mother; Cancer in his mother.  ROS:   Please see the history of present illness.     All other systems reviewed and are  negative.  EKGs/Labs/Other Studies Reviewed:  The following studies were reviewed today:   EKG:   EKG Interpretation Date/Time:  Wednesday October 28 2023 14:10:09 EDT Ventricular Rate:  58 PR Interval:  200 QRS Duration:  80 QT Interval:  438 QTC Calculation: 429 R Axis:   18  Text Interpretation: Sinus bradycardia When compared with ECG of 01-Jan-2022 13:34, No significant change was found Confirmed by Joshua Lowe ([id number]) on 10/28/2023 2:26:19 PM       Recent Labs: No results found for requested labs within last 365 days.  Recent Lipid Panel No results found for: "CHOL", "TRIG", "HDL", "CHOLHDL", "VLDL", "LDLCALC", "LDLDIRECT"  Physical Exam:     Physical Exam: Blood pressure 122/72, pulse 64, height 6' (1.829 m), weight 208 lb 12.8 oz (94.7 kg), SpO2 97%.       GEN:  Well nourished, well developed in no acute distress HEENT: Normal NECK: No JVD; No carotid bruits LYMPHATICS: No lymphadenopathy CARDIAC: RRR , no murmurs, rubs, gallops RESPIRATORY:  Clear to auscultation without rales, wheezing or rhonchi  ABDOMEN: Soft, non-tender, non-distended MUSCULOSKELETAL:  No edema; No deformity  SKIN: Warm and dry NEUROLOGIC:  Lowe and oriented x 3    ASSESSMENT:    1. Persistent atrial fibrillation (HCC)   2. Hyperlipidemia LDL goal <70     PLAN:      Atrial Fib:  CHADS2VASC   =  0    .  Very stable on tikosyn .   Mag and BMP today  ECG looks good     Continue current meds.  Will see an APP in 6 months with BMP and Mag level     Medication Adjustments/Labs and Tests Ordered: Current medicines are reviewed at length with the patient today.  Concerns regarding medicines are outlined above.  Orders Placed This Encounter  Procedures   Basic metabolic panel with GFR   Magnesium   EKG 12-Lead   No orders of the defined types were placed in this encounter.   Patient Instructions  Lab Work: Magnesium, BMET today Repeat BMET and Magnesium in 6  months If you have labs (blood work) drawn today and your tests are completely normal, you will receive your results only by: MyChart Message (if you have MyChart) OR A paper copy in the mail If you have any lab test that is abnormal or we need to change your treatment, we will call you to review the results.  Follow-Up: At [facility], you and your health needs are our priority.  As part of our continuing mission to provide you with exceptional heart care, our providers are all part of one team.  This team includes your primary Cardiologist (physician) and Advanced Practice Providers or APPs (Physician Assistants and Nurse Practitioners) who all work together to provide you with the care you need, when you need it.  Your next appointment:   6 month(s)  Provider:   Lovette Rud, PA       1st Floor: - Lobby - Registration  - Pharmacy  - Lab - Cafe  2nd Floor: - PV Lab - Diagnostic Testing (echo, CT, nuclear med)  3rd Floor: - Vacant  4th Floor: - TCTS (cardiothoracic surgery) - AFib Clinic - Structural Heart Clinic - Vascular Surgery  - Vascular Ultrasound  5th Floor: - HeartCare Cardiology (general and EP) - Clinical Pharmacy for coumadin, hypertension, lipid, weight-loss medications, and med management appointments    Valet parking services will be available as well.     Signed, Joshua Alert, MD  10/28/2023 2:26  PM    [facility]

## 2023-10-28 ENCOUNTER — Ambulatory Visit: Payer: BC Managed Care – PPO | Attending: Cardiovascular Disease | Admitting: Cardiovascular Disease

## 2023-10-28 ENCOUNTER — Encounter: Payer: Self-pay | Admitting: Cardiovascular Disease

## 2023-10-28 VITALS — BP 122/72 | HR 64 | Ht 72.0 in | Wt 208.8 lb

## 2023-10-28 DIAGNOSIS — I4819 Other persistent atrial fibrillation: Secondary | ICD-10-CM

## 2023-10-28 DIAGNOSIS — E785 Hyperlipidemia, unspecified: Secondary | ICD-10-CM | POA: Diagnosis not present

## 2023-10-28 NOTE — Patient Instructions (Signed)
 Lab Work: Magnesium, BMET today Repeat BMET and Magnesium in 6 months If you have labs (blood work) drawn today and your tests are completely normal, you will receive your results only by: MyChart Message (if you have MyChart) OR A paper copy in the mail If you have any lab test that is abnormal or we need to change your treatment, we will call you to review the results.  Follow-Up: At [facility], you and your health needs are our priority.  As part of our continuing mission to provide you with exceptional heart care, our providers are all part of one team.  This team includes your primary Cardiologist (physician) and Advanced Practice Providers or APPs (Physician Assistants and Nurse Practitioners) who all work together to provide you with the care you need, when you need it.  Your next appointment:   6 month(s)  Provider:   Lovette Rud, PA       1st Floor: - Lobby - Registration  - Pharmacy  - Lab - Cafe  2nd Floor: - PV Lab - Diagnostic Testing (echo, CT, nuclear med)  3rd Floor: - Vacant  4th Floor: - TCTS (cardiothoracic surgery) - AFib Clinic - Structural Heart Clinic - Vascular Surgery  - Vascular Ultrasound  5th Floor: - HeartCare Cardiology (general and EP) - Clinical Pharmacy for coumadin, hypertension, lipid, weight-loss medications, and med management appointments    Valet parking services will be available as well.

## 2023-10-29 ENCOUNTER — Encounter: Payer: Self-pay | Admitting: Cardiovascular Disease

## 2023-10-29 ENCOUNTER — Other Ambulatory Visit: Payer: Self-pay | Admitting: Internal Medicine

## 2023-10-29 DIAGNOSIS — M5116 Intervertebral disc disorders with radiculopathy, lumbar region: Secondary | ICD-10-CM

## 2023-10-29 DIAGNOSIS — M519 Unspecified thoracic, thoracolumbar and lumbosacral intervertebral disc disorder: Secondary | ICD-10-CM | POA: Diagnosis not present

## 2023-10-29 LAB — BASIC METABOLIC PANEL WITH GFR
BUN/Creatinine Ratio: 16 (ref 10–24)
BUN: 13 mg/dL (ref 8–27)
CO2: 24 mmol/L (ref 20–29)
Calcium: 9.1 mg/dL (ref 8.6–10.2)
Chloride: 101 mmol/L (ref 96–106)
Creatinine, Ser: 0.8 mg/dL (ref 0.76–1.27)
Glucose: 93 mg/dL (ref 70–99)
Potassium: 4.3 mmol/L (ref 3.5–5.2)
Sodium: 139 mmol/L (ref 134–144)
eGFR: 98 mL/min/{1.73_m2} (ref >59)

## 2023-10-29 LAB — MAGNESIUM: Magnesium: 2.2 mg/dL (ref 1.6–2.3)

## 2023-10-30 ENCOUNTER — Telehealth (HOSPITAL_COMMUNITY): Payer: Self-pay

## 2023-10-30 NOTE — Telephone Encounter (Signed)
 Wife wanted to know if he took prednisone would it interfere with Tikosyn . Informed her that it may increase his HR or trigger Afib. She stated they would think about it.

## 2023-10-30 NOTE — Telephone Encounter (Signed)
 A user error has taken place: encounter opened in error, closed for administrative reasons.

## 2023-11-05 ENCOUNTER — Ambulatory Visit
Admission: RE | Admit: 2023-11-05 | Discharge: 2023-11-05 | Disposition: A | Discharge status: Home / Self Care | Source: Ambulatory Visit | Attending: Internal Medicine | Admitting: Internal Medicine

## 2023-11-05 DIAGNOSIS — M48061 Spinal stenosis, lumbar region without neurogenic claudication: Secondary | ICD-10-CM | POA: Diagnosis not present

## 2023-11-05 DIAGNOSIS — M5116 Intervertebral disc disorders with radiculopathy, lumbar region: Secondary | ICD-10-CM

## 2023-11-05 DIAGNOSIS — Z981 Arthrodesis status: Secondary | ICD-10-CM | POA: Diagnosis not present

## 2023-11-10 DIAGNOSIS — C44629 Squamous cell carcinoma of skin of left upper limb, including shoulder: Secondary | ICD-10-CM | POA: Diagnosis not present

## 2023-11-12 ENCOUNTER — Other Ambulatory Visit: Payer: Self-pay | Admitting: Cardiology

## 2023-11-12 NOTE — Telephone Encounter (Signed)
 This is Dr. Candace Cerise pt. Dr. Marven Slimmer has only refilled these RX's each 1 time. Dr. Marven Slimmer did not prescribe. Does Dr. Marven Slimmer want to refill? Please advise.

## 2023-11-17 ENCOUNTER — Other Ambulatory Visit: Payer: Self-pay | Admitting: Cardiology

## 2023-11-17 NOTE — Telephone Encounter (Signed)
*  STAT* If patient is at the pharmacy, call can be transferred to refill team.   1. Which medications need to be refilled? (please list name of each medication and dose if known) Dofetilide  125 mg and Dofetilide  250 mg   2. Would you like to learn more about the convenience, safety, & potential cost savings by using the [facility]?     3. Are you open to using the [facility] (Type [facility].    4. Which pharmacy/location (including street and city if local pharmacy) is medication to be sent to?[facility] [address]   5. Do they need a 30 day or 90 day supply? 90 days # 180 and refills  please call today- out of medicine

## 2023-11-17 NOTE — Telephone Encounter (Signed)
 Pt's medications were sent to pt's pharmacy as requested. Confirmation received.

## 2023-11-18 ENCOUNTER — Other Ambulatory Visit: Payer: Self-pay | Admitting: Cardiology

## 2023-11-18 ENCOUNTER — Telehealth: Payer: Self-pay | Admitting: Physician Assistant

## 2023-11-18 MED ORDER — DOFETILIDE 250 MCG PO CAPS: 250.0000 ug | ORAL_CAPSULE | Freq: Two times a day (BID) | ORAL | 3 refills | Status: AC

## 2023-11-18 MED ORDER — DOFETILIDE 125 MCG PO CAPS: ORAL_CAPSULE | ORAL | 3 refills | Status: AC

## 2023-11-18 NOTE — Telephone Encounter (Signed)
 Pt c/o medication issue:  1. Name of Medication:   dofetilide  (TIKOSYN ) 125 MCG capsule    dofetilide  (TIKOSYN ) 250 MCG capsule   2. How are you currently taking this medication (dosage and times per day)?   3. Are you having a reaction (difficulty breathing--STAT)?   4. What is your medication issue? Spouse states we are confused about his medication. Spouse only wants to speak to Lovette Rud because she said we cannot get his medications straight even though she has called twice.

## 2023-11-18 NOTE — Telephone Encounter (Signed)
 Spoke with pt wife, she was told in the past that they did not need to follow up with lambert and they are needing a refill for his tikosyn . He has a recall for tessa in October and they will make that appointment once schedule is available. They are aware that he will need a new cardiologist as dr Alroy Aspen is leaving. Refill sent to the pharmacy electronically.

## 2023-12-15 DIAGNOSIS — M25511 Pain in right shoulder: Secondary | ICD-10-CM | POA: Diagnosis not present

## 2023-12-15 DIAGNOSIS — M5416 Radiculopathy, lumbar region: Secondary | ICD-10-CM | POA: Diagnosis not present

## 2023-12-23 DIAGNOSIS — M5416 Radiculopathy, lumbar region: Secondary | ICD-10-CM | POA: Diagnosis not present

## 2024-01-11 DIAGNOSIS — M5416 Radiculopathy, lumbar region: Secondary | ICD-10-CM | POA: Diagnosis not present

## 2024-01-13 DIAGNOSIS — M5416 Radiculopathy, lumbar region: Secondary | ICD-10-CM | POA: Diagnosis not present

## 2024-01-20 DIAGNOSIS — M5416 Radiculopathy, lumbar region: Secondary | ICD-10-CM | POA: Diagnosis not present

## 2024-02-02 DIAGNOSIS — R7303 Prediabetes: Secondary | ICD-10-CM | POA: Diagnosis not present

## 2024-02-02 DIAGNOSIS — M48061 Spinal stenosis, lumbar region without neurogenic claudication: Secondary | ICD-10-CM | POA: Diagnosis not present

## 2024-02-02 DIAGNOSIS — I4891 Unspecified atrial fibrillation: Secondary | ICD-10-CM | POA: Diagnosis not present

## 2024-02-02 DIAGNOSIS — M109 Gout, unspecified: Secondary | ICD-10-CM | POA: Diagnosis not present

## 2024-02-02 DIAGNOSIS — K219 Gastro-esophageal reflux disease without esophagitis: Secondary | ICD-10-CM | POA: Diagnosis not present

## 2024-02-02 DIAGNOSIS — G4733 Obstructive sleep apnea (adult) (pediatric): Secondary | ICD-10-CM | POA: Diagnosis not present

## 2024-02-02 DIAGNOSIS — Z23 Encounter for immunization: Secondary | ICD-10-CM | POA: Diagnosis not present

## 2024-02-02 DIAGNOSIS — E039 Hypothyroidism, unspecified: Secondary | ICD-10-CM | POA: Diagnosis not present

## 2024-02-02 DIAGNOSIS — Z0000 Encounter for general adult medical examination without abnormal findings: Secondary | ICD-10-CM | POA: Diagnosis not present

## 2024-02-02 DIAGNOSIS — M519 Unspecified thoracic, thoracolumbar and lumbosacral intervertebral disc disorder: Secondary | ICD-10-CM | POA: Diagnosis not present

## 2024-02-02 DIAGNOSIS — M5431 Sciatica, right side: Secondary | ICD-10-CM | POA: Diagnosis not present

## 2024-02-02 DIAGNOSIS — M545 Low back pain, unspecified: Secondary | ICD-10-CM | POA: Diagnosis not present

## 2024-02-02 DIAGNOSIS — M25511 Pain in right shoulder: Secondary | ICD-10-CM | POA: Diagnosis not present

## 2024-02-17 DIAGNOSIS — H5203 Hypermetropia, bilateral: Secondary | ICD-10-CM | POA: Diagnosis not present

## 2024-02-23 DIAGNOSIS — M5431 Sciatica, right side: Secondary | ICD-10-CM | POA: Diagnosis not present

## 2024-02-23 DIAGNOSIS — M545 Low back pain, unspecified: Secondary | ICD-10-CM | POA: Diagnosis not present

## 2024-02-23 DIAGNOSIS — M25511 Pain in right shoulder: Secondary | ICD-10-CM | POA: Diagnosis not present

## 2024-03-01 DIAGNOSIS — M545 Low back pain, unspecified: Secondary | ICD-10-CM | POA: Diagnosis not present

## 2024-03-01 DIAGNOSIS — M5431 Sciatica, right side: Secondary | ICD-10-CM | POA: Diagnosis not present

## 2024-03-01 DIAGNOSIS — M25511 Pain in right shoulder: Secondary | ICD-10-CM | POA: Diagnosis not present

## 2024-03-01 DIAGNOSIS — Z01818 Encounter for other preprocedural examination: Secondary | ICD-10-CM | POA: Diagnosis not present

## 2024-03-03 ENCOUNTER — Telehealth: Payer: Self-pay

## 2024-03-03 NOTE — Telephone Encounter (Signed)
   Pre-operative Risk Assessment    Patient Name: Joshua Lowe  DOB: [birth year] MRN: [id number]   Date of last office visit: 10/28/23 ALEENE PASSE, MD Date of next office visit: NONE   Request for Surgical Clearance    Procedure:  RIGHT REVERSE VS ANATOMIC TOTAL SHOULDER ARTHROSCOPY  Date of Surgery:  Clearance 04/10/24                                Surgeon:  DR FRANKY SUPPLE Surgeon's Group or Practice Name:  [facility] Phone number:  [phone] Fax number:  [phone]   Type of Clearance Requested:   - Medical    Type of Anesthesia:  Not Indicated   Additional requests/questions:    SignedLucie DELENA Ku   03/03/2024, 10:03 AM

## 2024-03-03 NOTE — Telephone Encounter (Signed)
 Called patient, NA, left message to contact our office to schedule cardiac clearance telehealth appointment.

## 2024-03-03 NOTE — Telephone Encounter (Signed)
   Name: Joshua Lowe  DOB: 1955-11-02  MRN: [id number]  Primary Cardiologist: Aleene Passe, MD (Inactive)   Preoperative team, please contact this patient and set up a phone call appointment for further preoperative risk assessment. Please obtain consent and complete medication review. Thank you for your help.  I confirm that guidance regarding antiplatelet and oral anticoagulation therapy has been completed and, if necessary, noted below.  None  I also confirmed the patient resides in the state of [place] . As per [facility] Medical Board telemedicine laws, the patient must reside in the state in which the provider is licensed.   Wyn Raddle, Jackee Shove, NP 03/03/2024, 12:43 PM [facility]

## 2024-03-04 ENCOUNTER — Telehealth: Payer: Self-pay

## 2024-03-04 NOTE — Telephone Encounter (Signed)
 Call patient again, NA, left message on VM box to contact our office to schedule cardiac clearance telehealth appointment.

## 2024-03-04 NOTE — Telephone Encounter (Signed)
 2nd attempt : Called patient, NA, left message to contact our office to schedule cardiac clearance telehealth appointment.

## 2024-03-04 NOTE — Telephone Encounter (Signed)
 Pt's wife returning call to schedule preop appt

## 2024-03-04 NOTE — Telephone Encounter (Signed)
 Appointment scheduled for 03/30/2024 @ 8:40. Med req and consent are complete

## 2024-03-04 NOTE — Telephone Encounter (Signed)
  Patient Consent for Virtual Visit     Joshua Lowe has provided verbal consent on 03/04/2024 for a virtual visit (video or telephone).   Appointment scheduled for 03/30/24 @ 8:40. Med and consent done.    CONSENT FOR VIRTUAL VISIT FOR:  Joshua Lowe  By participating in this virtual visit I agree to the following:  I hereby voluntarily request, consent and authorize [facility] and its employed or contracted physicians, physician assistants, nurse practitioners or other licensed health care professionals (the Practitioner), to provide me with telemedicine health care services (the "Services) as deemed necessary by the treating Practitioner. I acknowledge and consent to receive the Services by the Practitioner via telemedicine. I understand that the telemedicine visit will involve communicating with the Practitioner through live audiovisual communication technology and the disclosure of certain medical information by electronic transmission. I acknowledge that I have been given the opportunity to request an in-person assessment or other available alternative prior to the telemedicine visit and am voluntarily participating in the telemedicine visit.  I understand that I have the right to withhold or withdraw my consent to the use of telemedicine in the course of my care at any time, without affecting my right to future care or treatment, and that the Practitioner or I may terminate the telemedicine visit at any time. I understand that I have the right to inspect all information obtained and/or recorded in the course of the telemedicine visit and may receive copies of available information for a reasonable fee.  I understand that some of the potential risks of receiving the Services via telemedicine include:  Delay or interruption in medical evaluation due to technological equipment failure or disruption; Information transmitted may not be sufficient (e.g. poor resolution of images) to allow  for appropriate medical decision making by the Practitioner; and/or  In rare instances, security protocols could fail, causing a breach of personal health information.  Furthermore, I acknowledge that it is my responsibility to provide information about my medical history, conditions and care that is complete and accurate to the best of my ability. I acknowledge that Practitioner's advice, recommendations, and/or decision may be based on factors not within their control, such as incomplete or inaccurate data provided by me or distortions of diagnostic images or specimens that may result from electronic transmissions. I understand that the practice of medicine is not an exact science and that Practitioner makes no warranties or guarantees regarding treatment outcomes. I acknowledge that a copy of this consent can be made available to me via my patient portal [facility] MyChart), or I can request a printed copy by calling the office of [facility].    I understand that my insurance will be billed for this visit.   I have read or had this consent read to me. I understand the contents of this consent, which adequately explains the benefits and risks of the Services being provided via telemedicine.  I have been provided ample opportunity to ask questions regarding this consent and the Services and have had my questions answered to my satisfaction. I give my informed consent for the services to be provided through the use of telemedicine in my medical care   232

## 2024-03-08 DIAGNOSIS — M5431 Sciatica, right side: Secondary | ICD-10-CM | POA: Diagnosis not present

## 2024-03-08 DIAGNOSIS — M25511 Pain in right shoulder: Secondary | ICD-10-CM | POA: Diagnosis not present

## 2024-03-15 DIAGNOSIS — M545 Low back pain, unspecified: Secondary | ICD-10-CM | POA: Diagnosis not present

## 2024-03-15 DIAGNOSIS — M5431 Sciatica, right side: Secondary | ICD-10-CM | POA: Diagnosis not present

## 2024-03-15 DIAGNOSIS — M25511 Pain in right shoulder: Secondary | ICD-10-CM | POA: Diagnosis not present

## 2024-03-22 DIAGNOSIS — M25511 Pain in right shoulder: Secondary | ICD-10-CM | POA: Diagnosis not present

## 2024-03-22 DIAGNOSIS — M545 Low back pain, unspecified: Secondary | ICD-10-CM | POA: Diagnosis not present

## 2024-03-22 DIAGNOSIS — M5431 Sciatica, right side: Secondary | ICD-10-CM | POA: Diagnosis not present

## 2024-03-29 DIAGNOSIS — M25511 Pain in right shoulder: Secondary | ICD-10-CM | POA: Diagnosis not present

## 2024-03-29 DIAGNOSIS — M5431 Sciatica, right side: Secondary | ICD-10-CM | POA: Diagnosis not present

## 2024-03-29 DIAGNOSIS — M545 Low back pain, unspecified: Secondary | ICD-10-CM | POA: Diagnosis not present

## 2024-03-30 ENCOUNTER — Ambulatory Visit: Attending: Cardiology | Admitting: Emergency Medicine

## 2024-03-30 DIAGNOSIS — Z01810 Encounter for preprocedural cardiovascular examination: Secondary | ICD-10-CM

## 2024-03-30 NOTE — Progress Notes (Signed)
 Virtual Visit via Telephone Note   Because of Joshua Lowe co-morbid illnesses, he is at least at moderate risk for complications without adequate follow up.  This format is felt to be most appropriate for this patient at this time.  Due to technical limitations with video connection (technology), today's appointment will be conducted as an audio only telehealth visit, and [name] verbally agreed to proceed in this manner.   All issues noted in this document were discussed and addressed.  No physical exam could be performed with this format.  Evaluation Performed:  Preoperative cardiovascular risk assessment _____________   Date:  03/30/2024   Patient ID:  Joshua Lowe, DOB [birth year], MRN [id number] Patient Location:  Home Provider location:   Office  Primary Care Provider:  Ransom Other, MD Primary Cardiologist:  None  Chief Complaint / Patient Profile   68 y.o. y/o male with a h/o persistent atrial fibrillation, hyperlipidemia, hypothyroidism, ankylosing spondylitis who is pending right first versus anatomic total shoulder arthroplasty on 04/14/2024 with [facility] and presents today for telephonic preoperative cardiovascular risk assessment.  History of Present Illness    Joshua Lowe is a 68 y.o. male who presents via audio/video conferencing for a telehealth visit today.  Pt was last seen in cardiology clinic on 10/28/2023 by Dr. Alveta.  At that time ASHTIAN VILLACIS was doing well.  The patient is now pending procedure as outlined above. Since his last visit, he denies chest pain, shortness of breath, lower extremity edema, fatigue, palpitations, melena, hematuria, hemoptysis, diaphoresis, weakness, presyncope, syncope, orthopnea, and PND.  Today he is doing well overall.  He is without acute cardiovascular concerns today.  He denies any symptoms concerning for recurrent atrial fibrillation.  Not currently monitoring heart rhythm at home.  Denies any anginal symptoms.  No  exertional chest pains or dyspnea.  Stays very active including walking daily, exercising at the gym, yard work, and occasional surfing.  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   Arthritis    Body mass index 31.0-31.9, adult    Cervical disc disease    Chronic back pain    Chronic neck pain    Eosinophilic disorder    Family history of adverse reaction to anesthesia    mother had nausea   Graves' disease    H/O colonoscopy 12/2008   Heart murmur    hx of years ago not detectable now   Hyperlipidemia    Hyperthyroidism    s/p RAI ablation   Hypothyroidism    Insomnia    Lumbar back pain    Osteoporosis    Persistent atrial fibrillation (HCC) 04/12/2019   Pre-diabetes    Pruritus, unspecified    Sleep apnea    oral device   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    Past Surgical History:  Procedure Laterality Date   CARDIOVERSION N/A 09/01/2019   Procedure: CARDIOVERSION;  Surgeon: Nahser, Aleene PARAS, MD;  Location: [facility] ENDOSCOPY;  Service: Cardiovascular;  Laterality: N/A;   CARDIOVERSION N/A 10/27/2019   Procedure: CARDIOVERSION;  Surgeon: Pietro Redell RAMAN, MD;  Location: [facility] ENDOSCOPY;  Service: Cardiovascular;  Laterality: N/A;   FISSURECTOMY     HAND SURGERY     KNEE ARTHROSCOPY  1999/2007   right knee   LIPOMA EXCISION     back   NECK SURGERY  SHOULDER SURGERY     TOTAL KNEE ARTHROPLASTY Right 06/02/2022   Procedure: TOTAL KNEE ARTHROPLASTY;  Surgeon: Melodi Lerner, MD;  Location: [facility] ORS;  Service: Orthopedics;  Laterality: Right;    Allergies  Allergies  Allergen Reactions   Bee Venom Anaphylaxis and Swelling    Bee Sting-     Home Medications    Prior to Admission medications   Medication Sig Start Date End Date Taking? Authorizing Provider  cetirizine (ZYRTEC) 10 MG tablet Take 10 mg by mouth daily as needed for  allergies. 05/27/20   [provider]  colchicine  0.6 MG tablet Take 1 tablet by mouth daily as needed (gout).    [provider]  dofetilide  (TIKOSYN ) 125 MCG capsule TAKE 1 CAPSULE BY MOUTH TWICE DAILY (TAKE WITH THE 250MCG CAPSULE TO EQUAL 375MCG TWICE DAILY) 11/18/23   Nahser, Aleene PARAS, MD  dofetilide  (TIKOSYN ) 250 MCG capsule Take 1 capsule (250 mcg total) by mouth 2 (two) times daily. Take with 125mcg dose to equal 375mcg twice a day 11/18/23   Nahser, Aleene PARAS, MD  EPINEPHrine 0.3 mg/0.3 mL IJ SOAJ injection Inject 1 mL into the skin as needed for allergies. 04/02/20   [provider]  gabapentin  (NEURONTIN ) 300 MG capsule Take a 300 mg capsule three times a day for two weeks following surgery.Then take a 300 mg capsule two times a day for two weeks. Then take a 300 mg capsule once a day for two weeks. Then discontinue. 06/03/22   Edmisten, Kristie L, PA  levothyroxine  (SYNTHROID ) 175 MCG tablet Take 175 mcg by mouth See admin instructions. Take 175 mcg daily except skip dose on Thursdays  Wife to bring from home due to pt takes only brand name per his PCP.    [provider]  Oxycodone HCl 10 MG TABS Take 1 tablet by mouth 3 (three) times daily. 03/02/23   [provider]  VEMLIDY  25 MG TABS Take 25 mg by mouth daily. Wife to bring from home in case pharmacy unable to supply . 04/04/19   [provider]  zolpidem  (AMBIEN ) 10 MG tablet Take 10 mg by mouth at bedtime.    [provider]    Physical Exam    Vital Signs:  KAMAURI DENARDO does not have vital signs available for review today.  Given telephonic nature of communication, physical exam is limited. AAOx3. NAD. Normal affect.  Speech and respirations are unlabored.  Accessory Clinical Findings    None  Assessment & Plan    1.  Preoperative Cardiovascular Risk Assessment: According to the Revised Cardiac Risk Index (RCRI), his Perioperative Risk of Major Cardiac Event is  (%): 0.4. His Functional Capacity in METs is: 7.28 according to the Duke Activity Status Index (DASI). Therefore, based on ACC/AHA guidelines, patient would be at acceptable risk for the planned procedure without further cardiovascular testing.  The patient was advised that if he develops new symptoms prior to surgery to contact our office to arrange for a follow-up visit, and he verbalized understanding.   A copy of this note will be routed to requesting surgeon.  Time:   Today, I have spent 11 minutes with the patient with telehealth technology discussing medical history, symptoms, and management plan.     Lum LITTIE Louis, NP  03/30/2024, 8:53 AM

## 2024-04-05 DIAGNOSIS — M25511 Pain in right shoulder: Secondary | ICD-10-CM | POA: Diagnosis not present

## 2024-04-05 DIAGNOSIS — M545 Low back pain, unspecified: Secondary | ICD-10-CM | POA: Diagnosis not present

## 2024-04-05 DIAGNOSIS — M5431 Sciatica, right side: Secondary | ICD-10-CM | POA: Diagnosis not present

## 2024-04-06 ENCOUNTER — Ambulatory Visit
Admission: RE | Admit: 2024-04-06 | Discharge: 2024-04-06 | Disposition: A | Discharge status: Home / Self Care | Source: Ambulatory Visit | Attending: Orthopedic Surgery | Admitting: Orthopedic Surgery

## 2024-04-06 ENCOUNTER — Other Ambulatory Visit: Payer: Self-pay | Admitting: Orthopedic Surgery

## 2024-04-06 DIAGNOSIS — M19011 Primary osteoarthritis, right shoulder: Secondary | ICD-10-CM

## 2024-04-07 NOTE — Progress Notes (Signed)
 Sent message, via epic in basket, requesting orders in epic from Careers adviser.

## 2024-04-08 NOTE — Patient Instructions (Addendum)
 SURGICAL WAITING ROOM VISITATION Patients having surgery or a procedure may have no more than 2 support people in the waiting area - these visitors may rotate in the visitor waiting room.   If the patient needs to stay at the hospital during part of their recovery, the visitor guidelines for inpatient rooms apply.  PRE-OP VISITATION  Pre-op nurse will coordinate an appropriate time for 1 support person to accompany the patient in pre-op.  This support person may not rotate.  This visitor will be contacted when the time is appropriate for the visitor to come back in the pre-op area.  Please refer to the [facility] website for the visitor guidelines for Inpatients (after your surgery is over and you are in a regular room).  You are not required to quarantine at this time prior to your surgery. However, you must do this: Hand Hygiene often Do NOT share personal items Notify your provider if you are in close contact with someone who has COVID or you develop fever 100.4 or greater, new onset of sneezing, cough, sore throat, shortness of breath or body aches.  If you test positive for Covid or have been in contact with anyone that has tested positive in the last 10 days please notify you surgeon.    Your procedure is scheduled on:  Thursday  April 21, 2024  Report to [facility] Main Entrance: [address] entrance where the [employer] is available.   Report to admitting at:  10:00   AM  Call this number if you have any questions or problems the morning of surgery [phone]  Do not eat or drink anything after Midnight the night prior to your surgery/procedure.   After Midnight you may have Water, or Sports drinks like Gatorade or Powerade (No RED color) until  06:00  AM the morning of your surgery.  After 6:00 am, do not drink or eat anything; No candy, chewing gum or throat lozenges.               FOLLOW ANY ADDITIONAL PRE OP INSTRUCTIONS YOU RECEIVED FROM YOUR SURGEON'S  OFFICE!!!   Oral Hygiene is also important to reduce your risk of infection.        Remember - BRUSH YOUR TEETH THE MORNING OF SURGERY WITH YOUR REGULAR TOOTHPASTE  Do NOT smoke after Midnight the night before surgery.  STOP TAKING all Vitamins, Herbs and supplements 1 week before your surgery.   Take ONLY these medicines the morning of surgery with A SIP OF WATER: Levothyroxine , Dofetilide  (Tikosyn ) and Oxycodone if needed for pain.   You may not have any metal on your body including  jewelry, and body piercing  Do not wear  lotions, powders, cologne, or deodorant  Men may shave face and neck.  Contacts, Hearing Aids, dentures or bridgework may not be worn into surgery. DENTURES WILL BE REMOVED PRIOR TO SURGERY PLEASE DO NOT APPLY Poly grip OR ADHESIVES!!!  Patients discharged on the day of surgery will not be allowed to drive home.  Someone NEEDS to stay with you for the first 24 hours after anesthesia.  Do not bring your home medications to the hospital. The Pharmacy will dispense medications listed on your medication list to you during your admission in the Hospital.  Special Instructions: Bring a copy of your healthcare power of attorney and living will documents the day of surgery, if you wish to have them scanned into your [facility] Records- EPIC  Please read over the following  fact sheets you were given: IF YOU HAVE QUESTIONS ABOUT YOUR PRE-OP INSTRUCTIONS, PLEASE CALL [phone].       Pre-operative 4 CHG Bath Instructions   You can play a key role in reducing the risk of infection after surgery. Your skin needs to be as free of germs as possible. You can reduce the number of germs on your skin by washing with CHG (chlorhexidine  gluconate) soap before surgery. CHG is an antiseptic soap that kills germs and continues to kill germs even after washing.   DO NOT use if you have an allergy to chlorhexidine /CHG or antibacterial soaps. If your skin becomes reddened  or irritated, stop using the CHG and notify one of our RNs at   Please shower with the CHG soap starting 4 days before surgery using the following schedule:   SUNDAY April 17, 2024    Please keep in mind the following:  DO NOT shave, including legs and underarms, starting the day of your first shower.   You may shave your face at any point before/day of surgery.  Place clean sheets on your bed the day you start using CHG soap. Use a clean washcloth (not used since being washed) for each shower. DO NOT sleep with pets once you start using the CHG.  CHG Shower Instructions:  If you choose to wash your hair and private area, wash first with your normal shampoo/soap.  After you use shampoo/soap, rinse your hair and body thoroughly to remove shampoo/soap residue.  Turn the water OFF and apply about 3 tablespoons (45 ml) of CHG soap to a CLEAN washcloth.  Apply CHG soap ONLY FROM YOUR NECK DOWN TO YOUR TOES (washing for 3-5 minutes)  DO NOT use CHG soap on face, private areas, open wounds, or sores.  Pay special attention to the area where your surgery is being performed.  If you are having back surgery, having someone wash your back for you may be helpful. Wait 2 minutes after CHG soap is applied, then you may rinse off the CHG soap.  Pat dry with a clean towel  Put on clean clothes/pajamas   If you choose to wear lotion, please use ONLY the CHG-compatible lotions on the back of this paper.     Additional instructions for the day of surgery: DO NOT APPLY any lotions, deodorants, cologne, or perfumes.   Put on clean/comfortable clothes.  Brush your teeth.  Ask your nurse before applying any prescription medications to the skin.   CHG Compatible Lotions   Aveeno Moisturizing lotion  Cetaphil Moisturizing Cream  Cetaphil Moisturizing Lotion  Clairol Herbal Essence Moisturizing Lotion, Dry Skin  Clairol Herbal Essence Moisturizing Lotion, Extra Dry Skin  Clairol Herbal Essence  Moisturizing Lotion, Normal Skin  Curel Age Defying Therapeutic Moisturizing Lotion with Alpha Hydroxy  Curel Extreme Care Body Lotion  Curel Soothing Hands Moisturizing Hand Lotion  Curel Therapeutic Moisturizing Cream, Fragrance-Free  Curel Therapeutic Moisturizing Lotion, Fragrance-Free  Curel Therapeutic Moisturizing Lotion, Original Formula  Eucerin Daily Replenishing Lotion  Eucerin Dry Skin Therapy Plus Alpha Hydroxy Crme  Eucerin Dry Skin Therapy Plus Alpha Hydroxy Lotion  Eucerin Original Crme  Eucerin Original Lotion  Eucerin Plus Crme Eucerin Plus Lotion  Eucerin TriLipid Replenishing Lotion  Keri Anti-Bacterial Hand Lotion  Keri Deep Conditioning Original Lotion Dry Skin Formula Softly Scented  Keri Deep Conditioning Original Lotion, Fragrance Free Sensitive Skin Formula  Keri Lotion Fast Absorbing Fragrance Free Sensitive Skin Formula  Keri Lotion Fast Absorbing Softly Scented Dry Skin  Formula  Keri Original Lotion  Keri Skin Renewal Lotion Keri Silky Smooth Lotion  Keri Silky Smooth Sensitive Skin Lotion  Nivea Body Creamy Conditioning Oil  Nivea Body Extra Enriched Lotion  Nivea Body Original Lotion  Nivea Body Sheer Moisturizing Lotion Nivea Crme  Nivea Skin Firming Lotion  NutraDerm 30 Skin Lotion  NutraDerm Skin Lotion  NutraDerm Therapeutic Skin Cream  NutraDerm Therapeutic Skin Lotion  ProShield Protective Hand Cream  Provon moisturizing lotion      Preparing for Total Shoulder Arthroplasty ================================================================= Please follow these instructions carefully, in addition to any other special Bathing information that was explained to you at the Presurgical Appointment:  BENZOYL PEROXIDE 5% GEL: Used to kill bacteria on the skin which could cause an infection at the surgery site.   Please do not use if you have an allergy to benzoyl peroxide. If your skin becomes reddened/irritated stop using the benzoyl  peroxide and inform your Doctor.   Starting two days before surgery, apply as follows:  1. Apply benzoyl peroxide gel in the morning and at night. Apply after taking a shower. If you are not taking a shower, clean entire shoulder front, back, and side, along with the armpit with a clean wet washcloth.  2. Place a quarter-sized dollop of the gel on your SHOULDER and rub in thoroughly, making sure to cover the front, back, and side of your shoulder, along with the armpit.   2 Days prior to Surgery   TUESDAY  04-19-2024 First Application _______ Morning Second Application _______ Night  Day Before Surgery      [facility]  04-20-2024 First Application______ Morning  On the night before surgery, wash your entire body (except hair, face and private areas) with CHG Soap. THEN, rub in the LAST application of the Benzoyl Peroxide Gel on your shoulder.   3. DO NOT USE THE BENZOYL PEROXIDE GEL ON THE DAY OF YOUR SURGERY       FAILURE TO FOLLOW THESE INSTRUCTIONS MAY RESULT IN THE CANCELLATION OF YOUR SURGERY  PATIENT SIGNATURE_________________________________  NURSE SIGNATURE__________________________________  ________________________________________________________________________           Joshua Lowe    An incentive spirometer is a tool that can help keep your lungs clear and active. This tool measures how well you are filling your lungs with each breath. Taking long deep breaths may help reverse or decrease the chance of developing breathing (pulmonary) problems (especially infection) following: A long period of time when you are unable to move or be active. BEFORE THE PROCEDURE  If the spirometer includes an indicator to show your best effort, your nurse or respiratory therapist will set it to a desired goal. If possible, sit up straight or lean slightly forward. Try not to slouch. Hold the incentive spirometer in an upright position. INSTRUCTIONS FOR USE  Sit on  the edge of your bed if possible, or sit up as far as you can in bed or on a chair. Hold the incentive spirometer in an upright position. Breathe out normally. Place the mouthpiece in your mouth and seal your lips tightly around it. Breathe in slowly and as deeply as possible, raising the piston or the ball toward the top of the column. Hold your breath for 3-5 seconds or for as long as possible. Allow the piston or ball to fall to the bottom of the column. Remove the mouthpiece from your mouth and breathe out normally. Rest for a few seconds and repeat Steps 1 through 7 at least 10  times every 1-2 hours when you are awake. Take your time and take a few normal breaths between deep breaths. The spirometer may include an indicator to show your best effort. Use the indicator as a goal to work toward during each repetition. After each set of 10 deep breaths, practice coughing to be sure your lungs are clear. If you have an incision (the cut made at the time of surgery), support your incision when coughing by placing a pillow or rolled up towels firmly against it. Once you are able to get out of bed, walk around indoors and cough well. You may stop using the incentive spirometer when instructed by your caregiver.  RISKS AND COMPLICATIONS Take your time so you do not get dizzy or light-headed. If you are in pain, you may need to take or ask for pain medication before doing incentive spirometry. It is harder to take a deep breath if you are having pain. AFTER USE Rest and breathe slowly and easily. It can be helpful to keep track of a log of your progress. Your caregiver can provide you with a simple table to help with this. If you are using the spirometer at home, follow these instructions: SEEK MEDICAL CARE IF:  You are having difficultly using the spirometer. You have trouble using the spirometer as often as instructed. Your pain medication is not giving enough relief while using the  spirometer. You develop fever of 100.5 F (38.1 C) or higher.                                                                                                    SEEK IMMEDIATE MEDICAL CARE IF:  You cough up bloody sputum that had not been present before. You develop fever of 102 F (38.9 C) or greater. You develop worsening pain at or near the incision site. MAKE SURE YOU:  Understand these instructions. Will watch your condition. Will get help right away if you are not doing well or get worse. Document Released: 11/03/2006 Document Revised: 09/15/2011 Document Reviewed: 01/04/2007 [facility] Patient Information 2014 [employer], [place].         If you would like to see a video about joint replacement:   [email or web address]

## 2024-04-08 NOTE — Progress Notes (Signed)
 COVID Vaccine received:  []  No [x]  Yes Date of any COVID positive Test in last 90 days: none  PCP - Ardell Seen, MD  Cardiologist - Jerrold Passe, MD)  Lum Louis, NP  cardiac clearance scanned and in 03-30-24 Epic note EP- Ole Holts, MD  [facility]- Stephane Quest, NP  [facility]  [place] clearance scanned to [facility]  Chest x-ray -  EKG -  10-28-2023  Epic Stress Test -  ECHO - 04-15-2019  Epic Cardiac Cath -  CT Coronary Calcium score: 0 on 07-26-2020  Epic  Pacemaker / ICD device [x]  No []  Yes   Spinal Cord Stimulator:[x]  No []  Yes       History of Sleep Apnea? []  No [x]  Yes   CPAP used?- [x]  No []  Yes  has Oral device  Patient has: [x]  NO Hx DM   []  Pre-DM   []  DM1  []   DM2 Does the patient monitor blood sugar?   [x]  N/A   []  No []  Yes  Last A1c was: 5.6 on  05-27-2022     Blood Thinner / Instructions:  none Aspirin Instructions:  none  Dental hx: []  Dentures:  []  N/A      []  Bridge or Partial:                   [x]  Loose or Damaged teeth:   Comments: The patient was given Benzoyl peroxide Gel as ordered. Instruction regarding application starting 2 days prior to surgery was given and patient voiced understanding.   Activity level: Able to walk up 2 flights of stairs without becoming significantly short of breath or having chest pain?  []  No   [x]    Yes  Patient can perform ADLs without assistance. []  No   [x]   Yes  Anesthesia review: A.fib- s/p DCCV x 2 in 2021, OSA- uses oral device  Patient denies any S&S of respiratory illness or Covid - no shortness of breath, fever, cough or chest pain at PAT appointment.  Patient verbalized understanding and agreement to the Pre-Surgical Instructions that were given to them at this PAT appointment. Patient was also educated of the need to review these PAT instructions again prior to his surgery.I reviewed the appropriate phone numbers to call if they have any and questions or concerns.

## 2024-04-11 ENCOUNTER — Other Ambulatory Visit: Payer: Self-pay

## 2024-04-11 ENCOUNTER — Encounter (HOSPITAL_COMMUNITY)
Admission: RE | Admit: 2024-04-11 | Discharge: 2024-04-11 | Disposition: A | Discharge status: Home / Self Care | Source: Ambulatory Visit | Attending: Orthopedic Surgery | Admitting: Orthopedic Surgery

## 2024-04-11 ENCOUNTER — Encounter (HOSPITAL_COMMUNITY): Payer: Self-pay

## 2024-04-11 VITALS — BP 107/73 | HR 62 | Temp 98.3°F | Resp 16 | Ht 72.0 in | Wt 205.0 lb

## 2024-04-11 DIAGNOSIS — M19011 Primary osteoarthritis, right shoulder: Secondary | ICD-10-CM | POA: Diagnosis not present

## 2024-04-11 DIAGNOSIS — Z01818 Encounter for other preprocedural examination: Secondary | ICD-10-CM

## 2024-04-11 DIAGNOSIS — E039 Hypothyroidism, unspecified: Secondary | ICD-10-CM | POA: Diagnosis not present

## 2024-04-11 DIAGNOSIS — I4891 Unspecified atrial fibrillation: Secondary | ICD-10-CM | POA: Insufficient documentation

## 2024-04-11 DIAGNOSIS — Z79891 Long term (current) use of opiate analgesic: Secondary | ICD-10-CM | POA: Insufficient documentation

## 2024-04-11 DIAGNOSIS — B181 Chronic viral hepatitis B without delta-agent: Secondary | ICD-10-CM | POA: Diagnosis not present

## 2024-04-11 DIAGNOSIS — Z01812 Encounter for preprocedural laboratory examination: Secondary | ICD-10-CM | POA: Insufficient documentation

## 2024-04-11 DIAGNOSIS — G4730 Sleep apnea, unspecified: Secondary | ICD-10-CM | POA: Diagnosis not present

## 2024-04-11 HISTORY — DX: Malignant (primary) neoplasm, unspecified: C80.1

## 2024-04-11 HISTORY — DX: Cardiac arrhythmia, unspecified: I49.9

## 2024-04-11 LAB — COMPREHENSIVE METABOLIC PANEL WITH GFR
ALT: 20 U/L (ref 0–44)
AST: 65 U/L — ABNORMAL HIGH (ref 15–41)
Albumin: 4.2 g/dL (ref 3.5–5.0)
Alkaline Phosphatase: 72 U/L (ref 38–126)
Anion gap: 11 (ref 5–15)
BUN: 14 mg/dL (ref 8–23)
CO2: 24 mmol/L (ref 22–32)
Calcium: 9.5 mg/dL (ref 8.9–10.3)
Chloride: 103 mmol/L (ref 98–111)
Creatinine, Ser: 0.83 mg/dL (ref 0.61–1.24)
GFR, Estimated: >60 mL/min (ref >60)
Glucose, Bld: 88 mg/dL (ref 70–99)
Potassium: 4.7 mmol/L (ref 3.5–5.1)
Sodium: 138 mmol/L (ref 135–145)
Total Bilirubin: 0.6 mg/dL (ref 0.0–1.2)
Total Protein: 7 g/dL (ref 6.5–8.1)

## 2024-04-11 LAB — CBC
HCT: 43.7 % (ref 39.0–52.0)
Hemoglobin: 14.3 g/dL (ref 13.0–17.0)
MCH: 30 pg (ref 26.0–34.0)
MCHC: 32.7 g/dL (ref 30.0–36.0)
MCV: 91.8 fL (ref 80.0–100.0)
Platelets: 237 K/uL (ref 150–400)
RBC: 4.76 MIL/uL (ref 4.22–5.81)
RDW: 14.7 % (ref 11.5–15.5)
WBC: 7 K/uL (ref 4.0–10.5)
nRBC: 0 % (ref 0.0–0.2)

## 2024-04-11 LAB — SURGICAL PCR SCREEN
MRSA, PCR: NEGATIVE
Staphylococcus aureus: POSITIVE — AB

## 2024-04-12 DIAGNOSIS — M25511 Pain in right shoulder: Secondary | ICD-10-CM | POA: Diagnosis not present

## 2024-04-12 DIAGNOSIS — M5431 Sciatica, right side: Secondary | ICD-10-CM | POA: Diagnosis not present

## 2024-04-12 DIAGNOSIS — M545 Low back pain, unspecified: Secondary | ICD-10-CM | POA: Diagnosis not present

## 2024-04-12 NOTE — Progress Notes (Signed)
 Anesthesia Chart Review   Case: [id number] Date/Time: 04/21/24 1218   Procedure: ARTHROPLASTY, SHOULDER, TOTAL, REVERSE (Right: Shoulder)   Anesthesia type: General   Diagnosis: Primary osteoarthritis of right shoulder [M19.011]   Pre-op diagnosis: osteoarthritis shoulder right   Location: [facility] ROOM 06 / [facility]   Surgeons: Melita Drivers, MD       DISCUSSION:68 y.o. never smoker with h/o sleep apnea uses oral device, hypothyroidism, atrial fibrillation stable on tikosyn , ankylosing spondylitis, right shoulder OA scheduled for above procedure 04/21/2024 with Dr. Drivers Melita.   Per cardiology preoperative evaluation 03/30/24, According to the Revised Cardiac Risk Index (RCRI), his Perioperative Risk of Major Cardiac Event is (%): 0.4. His Functional Capacity in METs is: 7.28 according to the Duke Activity Status Index (DASI). Therefore, based on ACC/AHA guidelines, patient would be at acceptable risk for the planned procedure without further cardiovascular testing.  Pt follows with [facility] and [facility] for chronic hepatitis B virus. Per preoperative evaluation, This patient with non-cirrhotic chronic HBV infection, on antiviral therapy and with normal liver function, is at low risk for major perioperative hepatic complications. The overall surgical risk is only modestly increased compared to the general population, and antiviral therapy could be continued throughout the perioperative period   VS: BP 107/73   Pulse 62   Temp 36.8 C (Oral)   Resp 16   Ht 6' (1.829 m)   Wt 93 kg   SpO2 99%   BMI 27.80 kg/m   PROVIDERS: Ransom Other, MD is PCP   Cardiologist - Aleene Passe, MD  LABS: Labs reviewed: Acceptable for surgery. (all labs ordered are listed, but only abnormal results are displayed)  Labs Reviewed  SURGICAL PCR SCREEN - Abnormal; Notable for the following components:      Result Value   Staphylococcus aureus POSITIVE (*)    All other components  within normal limits  COMPREHENSIVE METABOLIC PANEL WITH GFR - Abnormal; Notable for the following components:   AST 65 (*)    All other components within normal limits  CBC     IMAGES:   EKG:   CV: Echo 04/15/2019 1. Left ventricular ejection fraction, by visual estimation, is 60 to  65%. The left ventricle has normal function. Normal left ventricular size.  There is no left ventricular hypertrophy.   2. Left ventricular diastolic function could not be evaluated pattern of  LV diastolic filling.   3. Global right ventricle has normal systolic function.The right  ventricular size is normal. No increase in right ventricular wall  thickness.   4. Left atrial size was normal.   5. Right atrial size was normal.   6. The mitral valve is normal in structure. Trace mitral valve  regurgitation. No evidence of mitral stenosis.   7. The tricuspid valve is normal in structure. Tricuspid valve  regurgitation is trivial.   8. The aortic valve is tricuspid Aortic valve regurgitation is moderate  by color flow Doppler. Structurally normal aortic valve, with no evidence  of sclerosis or stenosis.   9. The pulmonic valve was grossly normal. Pulmonic valve regurgitation is  trivial by color flow Doppler.  10. Mildly elevated pulmonary artery systolic pressure.  11. The inferior vena cava is dilated in size with <50% respiratory  variability, suggesting right atrial pressure of 15 mmHg.  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 fib   Eosinophilic disorder    Family history of adverse reaction to anesthesia    mother had nausea   Graves' disease    H/O colonoscopy 12/2008   Heart murmur    hx of years ago not detectable  now   Hyperlipidemia    Hyperthyroidism    s/p RAI ablation   Hypothyroidism    Insomnia    Lumbar back pain    Osteoporosis    Persistent atrial fibrillation (HCC) 04/12/2019   Pruritus, unspecified    Sleep apnea    oral device   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     Past Surgical History:  Procedure Laterality Date   CARDIOVERSION N/A 09/01/2019   Procedure: CARDIOVERSION;  Surgeon: Nahser, Aleene PARAS, MD;  Location: [facility] ENDOSCOPY;  Service: Cardiovascular;  Laterality: N/A;   CARDIOVERSION N/A 10/27/2019   Procedure: CARDIOVERSION;  Surgeon: Pietro Redell RAMAN, MD;  Location: [facility] ENDOSCOPY;  Service: Cardiovascular;  Laterality: N/A;   FISSURECTOMY     HAND SURGERY Right    Joint  replacement middle finger   KNEE ARTHROSCOPY  1999/2007   right knee   LIPOMA EXCISION     back   NECK SURGERY  09/2003   2 level ACDF  C4-5 and C5-6 by Dr. Alix   SHOULDER SURGERY Bilateral    arthroscopies on both shoulders   TOTAL KNEE ARTHROPLASTY Right 06/02/2022   Procedure: TOTAL KNEE ARTHROPLASTY;  Surgeon: Melodi Lerner, MD;  Location: [facility];  Service: Orthopedics;  Laterality: Right;    MEDICATIONS:  cetirizine (ZYRTEC) 10 MG tablet   colchicine  0.6 MG tablet   dofetilide  (TIKOSYN ) 125 MCG capsule   dofetilide  (TIKOSYN ) 250 MCG capsule   EPINEPHrine 0.3 mg/0.3 mL IJ SOAJ injection   gabapentin  (NEURONTIN ) 300 MG capsule   levothyroxine  (SYNTHROID ) 175 MCG tablet   Melatonin 10 MG TABS   Oxycodone HCl 10 MG TABS   pregabalin (LYRICA) 25 MG capsule   tiZANidine (ZANAFLEX) 4 MG tablet   VEMLIDY  25 MG TABS   zolpidem  (AMBIEN ) 10 MG tablet   No current facility-administered medications for this encounter.     Harlene Hoots Ward, PA-C [facility] [phone]

## 2024-04-12 NOTE — Progress Notes (Signed)
 Patient's PCR screen is positive for  STAPH. Appropriate notes have been placed on the patient's chart. This note has been routed to Dr.Supple for review. The Patient's surgery is currently scheduled for:  04-21-2024   at [facility]  Shawnee Aloe, BSN, CVRN-BC   Pre-Surgical Testing Nurse [facility]  [phone]

## 2024-04-12 NOTE — Anesthesia Preprocedure Evaluation (Addendum)
 Anesthesia Evaluation  Patient identified by MRN, date of birth, ID band Patient awake    Reviewed: Allergy & Precautions, Patient's Chart, lab work & pertinent test results  History of Anesthesia Complications Negative for: history of anesthetic complications  Airway Mallampati: II  TM Distance: >3 FB Neck ROM: Full    Dental no notable dental hx.    Pulmonary sleep apnea    Pulmonary exam normal        Cardiovascular Normal cardiovascular exam+ dysrhythmias      Neuro/Psych negative neurological ROS     GI/Hepatic negative GI ROS,,,(+) Hepatitis -, B  Endo/Other  Hypothyroidism    Renal/GU negative Renal ROS     Musculoskeletal  (+) Arthritis ,    Abdominal   Peds  Hematology negative hematology ROS (+)   Anesthesia Other Findings   Reproductive/Obstetrics                              Anesthesia Physical Anesthesia Plan  ASA: 2  Anesthesia Plan: General   Post-op Pain Management: Minimal or no pain anticipated   Induction: Intravenous  PONV Risk Score and Plan: 2 and Treatment may vary due to age or medical condition, Ondansetron , Dexamethasone  and Midazolam   Airway Management Planned: Oral ETT  Additional Equipment: None  Intra-op Plan:   Post-operative Plan: Extubation in OR  Informed Consent: I have reviewed the patients History and Physical, chart, labs and discussed the procedure including the risks, benefits and alternatives for the proposed anesthesia with the patient or authorized representative who has indicated his/her understanding and acceptance.     Dental advisory given  Plan Discussed with: CRNA  Anesthesia Plan Comments: (See PAT note 04/11/2024)         Anesthesia Quick Evaluation

## 2024-04-14 ENCOUNTER — Encounter (HOSPITAL_COMMUNITY)

## 2024-04-18 DIAGNOSIS — M545 Low back pain, unspecified: Secondary | ICD-10-CM | POA: Diagnosis not present

## 2024-04-18 DIAGNOSIS — M25511 Pain in right shoulder: Secondary | ICD-10-CM | POA: Diagnosis not present

## 2024-04-18 DIAGNOSIS — M5431 Sciatica, right side: Secondary | ICD-10-CM | POA: Diagnosis not present

## 2024-04-21 ENCOUNTER — Ambulatory Visit (HOSPITAL_COMMUNITY)
Admission: RE | Admit: 2024-04-21 | Discharge: 2024-04-21 | Disposition: A | Discharge status: Home / Self Care | Source: Ambulatory Visit | Attending: Orthopedic Surgery | Admitting: Orthopedic Surgery

## 2024-04-21 ENCOUNTER — Ambulatory Visit (HOSPITAL_BASED_OUTPATIENT_CLINIC_OR_DEPARTMENT_OTHER): Admitting: Anesthesiology

## 2024-04-21 ENCOUNTER — Other Ambulatory Visit: Payer: Self-pay

## 2024-04-21 ENCOUNTER — Encounter (HOSPITAL_COMMUNITY): Payer: Self-pay | Admitting: Orthopedic Surgery

## 2024-04-21 ENCOUNTER — Encounter (HOSPITAL_COMMUNITY)
Admission: RE | Disposition: A | Discharge status: Home / Self Care | Payer: Self-pay | Source: Ambulatory Visit | Attending: Orthopedic Surgery

## 2024-04-21 ENCOUNTER — Ambulatory Visit (HOSPITAL_COMMUNITY): Payer: Self-pay | Admitting: Medical

## 2024-04-21 DIAGNOSIS — M19011 Primary osteoarthritis, right shoulder: Secondary | ICD-10-CM

## 2024-04-21 DIAGNOSIS — I4819 Other persistent atrial fibrillation: Secondary | ICD-10-CM | POA: Insufficient documentation

## 2024-04-21 DIAGNOSIS — M25711 Osteophyte, right shoulder: Secondary | ICD-10-CM | POA: Diagnosis not present

## 2024-04-21 DIAGNOSIS — G4730 Sleep apnea, unspecified: Secondary | ICD-10-CM | POA: Diagnosis not present

## 2024-04-21 DIAGNOSIS — G8918 Other acute postprocedural pain: Secondary | ICD-10-CM | POA: Diagnosis not present

## 2024-04-21 HISTORY — PX: REVERSE SHOULDER ARTHROPLASTY: SHX5054

## 2024-04-21 SURGERY — ARTHROPLASTY, SHOULDER, TOTAL, REVERSE
Anesthesia: General | Site: Shoulder | Laterality: Right

## 2024-04-21 MED ORDER — CHLORHEXIDINE GLUCONATE 0.12 % MT SOLN
15.0000 mL | Freq: Once | OROMUCOSAL | Status: AC
  Administered 2024-04-21: 15 mL via OROMUCOSAL

## 2024-04-21 MED ORDER — BUPIVACAINE-EPINEPHRINE (PF) 0.5% -1:200000 IJ SOLN
INTRAMUSCULAR | Status: DC | PRN
  Administered 2024-04-21: 15 mL via PERINEURAL

## 2024-04-21 MED ORDER — FENTANYL CITRATE (PF) 50 MCG/ML IJ SOSY
PREFILLED_SYRINGE | INTRAMUSCULAR | Status: AC
  Filled 2024-04-21: qty 1

## 2024-04-21 MED ORDER — ONDANSETRON HCL 4 MG/2ML IJ SOLN
INTRAMUSCULAR | Status: DC | PRN
  Administered 2024-04-21: 4 mg via INTRAVENOUS

## 2024-04-21 MED ORDER — PROPOFOL 10 MG/ML IV BOLUS
INTRAVENOUS | Status: AC
  Filled 2024-04-21: qty 20

## 2024-04-21 MED ORDER — FENTANYL CITRATE (PF) 50 MCG/ML IJ SOSY
50.0000 ug | PREFILLED_SYRINGE | INTRAMUSCULAR | Status: AC | PRN
  Administered 2024-04-21: 100 ug via INTRAVENOUS
  Filled 2024-04-21: qty 2

## 2024-04-21 MED ORDER — SUGAMMADEX SODIUM 200 MG/2ML IV SOLN
INTRAVENOUS | Status: AC
  Filled 2024-04-21: qty 2

## 2024-04-21 MED ORDER — EPHEDRINE SULFATE (PRESSORS) 50 MG/ML IJ SOLN
INTRAMUSCULAR | Status: DC | PRN
  Administered 2024-04-21: 5 mg via INTRAVENOUS
  Administered 2024-04-21: 10 mg via INTRAVENOUS

## 2024-04-21 MED ORDER — LACTATED RINGERS IV SOLN: INTRAVENOUS | Status: DC

## 2024-04-21 MED ORDER — HYDROMORPHONE HCL 4 MG PO TABS: 4.0000 mg | ORAL_TABLET | ORAL | 0 refills | Status: AC | PRN

## 2024-04-21 MED ORDER — CYCLOBENZAPRINE HCL 10 MG PO TABS
10.0000 mg | ORAL_TABLET | Freq: Three times a day (TID) | ORAL | 1 refills | Status: AC | PRN
Start: 2024-04-21 — End: ?

## 2024-04-21 MED ORDER — ORAL CARE MOUTH RINSE: 15.0000 mL | Freq: Once | OROMUCOSAL | Status: AC

## 2024-04-21 MED ORDER — VANCOMYCIN HCL 1000 MG IV SOLR
INTRAVENOUS | Status: DC | PRN
  Administered 2024-04-21: 1000 mg via TOPICAL

## 2024-04-21 MED ORDER — LIDOCAINE 2% (20 MG/ML) 5 ML SYRINGE
INTRAMUSCULAR | Status: DC | PRN
  Administered 2024-04-21: 100 mg via INTRAVENOUS

## 2024-04-21 MED ORDER — SUGAMMADEX SODIUM 200 MG/2ML IV SOLN
INTRAVENOUS | Status: DC | PRN
  Administered 2024-04-21: 200 mg via INTRAVENOUS

## 2024-04-21 MED ORDER — LIDOCAINE HCL (PF) 2 % IJ SOLN
INTRAMUSCULAR | Status: AC
  Filled 2024-04-21: qty 5

## 2024-04-21 MED ORDER — FENTANYL CITRATE (PF) 250 MCG/5ML IJ SOLN
INTRAMUSCULAR | Status: DC | PRN
  Administered 2024-04-21 (×2): 50 ug via INTRAVENOUS

## 2024-04-21 MED ORDER — CEFAZOLIN SODIUM-DEXTROSE 2-4 GM/100ML-% IV SOLN
2.0000 g | INTRAVENOUS | Status: AC
  Administered 2024-04-21: 2 g via INTRAVENOUS
  Filled 2024-04-21: qty 100

## 2024-04-21 MED ORDER — PROPOFOL 10 MG/ML IV BOLUS
INTRAVENOUS | Status: DC | PRN
  Administered 2024-04-21: 170 mg via INTRAVENOUS

## 2024-04-21 MED ORDER — VANCOMYCIN HCL 1000 MG IV SOLR
INTRAVENOUS | Status: AC
  Filled 2024-04-21: qty 20

## 2024-04-21 MED ORDER — FENTANYL CITRATE (PF) 50 MCG/ML IJ SOSY
PREFILLED_SYRINGE | INTRAMUSCULAR | Status: AC
  Filled 2024-04-21: qty 2

## 2024-04-21 MED ORDER — ONDANSETRON HCL 4 MG/2ML IJ SOLN: 4.0000 mg | Freq: Once | INTRAMUSCULAR | Status: DC | PRN

## 2024-04-21 MED ORDER — ONDANSETRON HCL 4 MG PO TABS: 4.0000 mg | ORAL_TABLET | Freq: Three times a day (TID) | ORAL | 0 refills | Status: AC | PRN

## 2024-04-21 MED ORDER — OXYCODONE HCL 5 MG PO TABS
10.0000 mg | ORAL_TABLET | Freq: Once | ORAL | Status: AC
  Administered 2024-04-21: 10 mg via ORAL

## 2024-04-21 MED ORDER — BUPIVACAINE LIPOSOME 1.3 % IJ SUSP
INTRAMUSCULAR | Status: DC | PRN
  Administered 2024-04-21: 10 mL via PERINEURAL

## 2024-04-21 MED ORDER — CHLORHEXIDINE GLUCONATE 4 % EX SOLN: 1.0000 | CUTANEOUS | 1 refills | Status: AC

## 2024-04-21 MED ORDER — EPHEDRINE 5 MG/ML INJ
INTRAVENOUS | Status: AC
  Filled 2024-04-21: qty 5

## 2024-04-21 MED ORDER — PHENYLEPHRINE HCL-NACL 20-0.9 MG/250ML-% IV SOLN
INTRAVENOUS | Status: DC | PRN
  Administered 2024-04-21: 50 ug/min via INTRAVENOUS

## 2024-04-21 MED ORDER — AMISULPRIDE (ANTIEMETIC) 5 MG/2ML IV SOLN: 10.0000 mg | Freq: Once | INTRAVENOUS | Status: DC | PRN

## 2024-04-21 MED ORDER — DEXAMETHASONE SOD PHOSPHATE PF 10 MG/ML IJ SOLN
INTRAMUSCULAR | Status: DC | PRN
  Administered 2024-04-21 (×3): 10 mg via INTRAVENOUS

## 2024-04-21 MED ORDER — 0.9 % SODIUM CHLORIDE (POUR BTL) OPTIME
TOPICAL | Status: DC | PRN
  Administered 2024-04-21: 1000 mL

## 2024-04-21 MED ORDER — MIDAZOLAM HCL (PF) 2 MG/2ML IJ SOLN
1.0000 mg | INTRAMUSCULAR | Status: AC | PRN
  Administered 2024-04-21: 2 mg via INTRAVENOUS
  Filled 2024-04-21: qty 2

## 2024-04-21 MED ORDER — FENTANYL CITRATE (PF) 50 MCG/ML IJ SOSY
25.0000 ug | PREFILLED_SYRINGE | INTRAMUSCULAR | Status: DC | PRN
  Administered 2024-04-21 (×3): 50 ug via INTRAVENOUS

## 2024-04-21 MED ORDER — CELECOXIB 200 MG PO CAPS
200.0000 mg | ORAL_CAPSULE | Freq: Every day | ORAL | 2 refills | Status: AC
Start: 2024-04-21 — End: 2025-04-21

## 2024-04-21 MED ORDER — ROCURONIUM BROMIDE 10 MG/ML (PF) SYRINGE
PREFILLED_SYRINGE | INTRAVENOUS | Status: AC
  Filled 2024-04-21: qty 10

## 2024-04-21 MED ORDER — TRANEXAMIC ACID-NACL 1000-0.7 MG/100ML-% IV SOLN
1000.0000 mg | INTRAVENOUS | Status: AC
  Administered 2024-04-21: 1000 mg via INTRAVENOUS
  Filled 2024-04-21: qty 100

## 2024-04-21 MED ORDER — ONDANSETRON HCL 4 MG/2ML IJ SOLN
INTRAMUSCULAR | Status: AC
  Filled 2024-04-21: qty 2

## 2024-04-21 MED ORDER — FENTANYL CITRATE (PF) 100 MCG/2ML IJ SOLN
INTRAMUSCULAR | Status: AC
  Filled 2024-04-21: qty 2

## 2024-04-21 MED ORDER — ROCURONIUM 10MG/ML (10ML) SYRINGE FOR MEDFUSION PUMP - OPTIME
INTRAVENOUS | Status: DC | PRN
  Administered 2024-04-21: 60 mg via INTRAVENOUS

## 2024-04-21 MED ORDER — STERILE WATER FOR IRRIGATION IR SOLN
Status: DC | PRN
  Administered 2024-04-21: 1000 mL

## 2024-04-21 MED ORDER — OXYCODONE HCL 5 MG PO TABS
ORAL_TABLET | ORAL | Status: AC
  Filled 2024-04-21: qty 2

## 2024-04-21 SURGICAL SUPPLY — 59 items
BAG COUNTER SPONGE SURGICOUNT (BAG) IMPLANT
BAG ZIPLOCK 12X15 (MISCELLANEOUS) ×1 IMPLANT
BIT DRILL AR 3 NS (BIT) IMPLANT
BLADE SAW SGTL 83.5X18.5 (BLADE) ×1 IMPLANT
BNDG COHESIVE 4X5 TAN STRL LF (GAUZE/BANDAGES/DRESSINGS) ×1 IMPLANT
COOLER ICEMAN CLASSIC (MISCELLANEOUS) ×1 IMPLANT
COVER BACK TABLE 60X90IN (DRAPES) ×1 IMPLANT
COVER SURGICAL LIGHT HANDLE (MISCELLANEOUS) ×1 IMPLANT
CUP SUT UNIV REVERS 39 NEU (Shoulder) IMPLANT
DRAPE SHEET LG 3/4 BI-LAMINATE (DRAPES) ×1 IMPLANT
DRAPE SURG 17X11 SM STRL (DRAPES) ×1 IMPLANT
DRAPE SURG ORHT 6 SPLT 77X108 (DRAPES) ×2 IMPLANT
DRAPE TOP 10253 STERILE (DRAPES) ×1 IMPLANT
DRAPE U-SHAPE 47X51 STRL (DRAPES) ×1 IMPLANT
DRESSING AQUACEL AG SP 3.5X6 (GAUZE/BANDAGES/DRESSINGS) ×1 IMPLANT
DURAPREP 26ML APPLICATOR (WOUND CARE) ×1 IMPLANT
ELECT BLADE TIP CTD 4 INCH (ELECTRODE) ×1 IMPLANT
ELECT PENCIL ROCKER SW 15FT (MISCELLANEOUS) ×1 IMPLANT
ELECT REM PT RETURN 15FT ADLT (MISCELLANEOUS) ×1 IMPLANT
FACESHIELD WRAPAROUND OR TEAM (MASK) ×5 IMPLANT
GLENOID UNI REV MOD 24 +2 LAT (Joint) IMPLANT
GLENOSPHERE 39+4 LAT/24 UNI RV (Joint) IMPLANT
GLOVE BIO SURGEON STRL SZ7.5 (GLOVE) ×1 IMPLANT
GLOVE BIO SURGEON STRL SZ8 (GLOVE) ×1 IMPLANT
GLOVE SS BIOGEL STRL SZ 7 (GLOVE) ×1 IMPLANT
GLOVE SS BIOGEL STRL SZ 7.5 (GLOVE) ×1 IMPLANT
GOWN STRL SURGICAL XL XLNG (GOWN DISPOSABLE) ×2 IMPLANT
INSERT HUMERAL 39/+6 (Insert) IMPLANT
KIT BASIN OR (CUSTOM PROCEDURE TRAY) ×1 IMPLANT
KIT TURNOVER KIT A (KITS) ×1 IMPLANT
MANIFOLD NEPTUNE II (INSTRUMENTS) ×1 IMPLANT
NDL TAPERED W/ NITINOL LOOP (MISCELLANEOUS) ×1 IMPLANT
NEEDLE TAPERED W/ NITINOL LOOP (MISCELLANEOUS) ×1 IMPLANT
NS IRRIG 1000ML POUR BTL (IV SOLUTION) ×1 IMPLANT
PACK SHOULDER (CUSTOM PROCEDURE TRAY) ×1 IMPLANT
PAD ARMBOARD POSITIONER FOAM (MISCELLANEOUS) ×1 IMPLANT
PAD COLD SHLDR WRAP-ON (PAD) ×1 IMPLANT
PIN NITINOL TARGETER 2.8 (PIN) IMPLANT
PIN SET MODULAR GLENOID SYSTEM (PIN) IMPLANT
RESTRAINT HEAD UNIVERSAL NS (MISCELLANEOUS) ×1 IMPLANT
SCREW CENTRAL MOD 30MM (Screw) IMPLANT
SCREW PERI LOCK 5.5X24 (Screw) IMPLANT
SCREW PERI LOCK 5.5X36 (Screw) IMPLANT
SCREW PERIPHERAL 5.5X20 LOCK (Screw) IMPLANT
SCREW PERIPHERAL 5.5X28 LOCK (Screw) IMPLANT
SLING ARM FOAM STRAP LRG (SOFTGOODS) IMPLANT
SLING ARM FOAM STRAP MED (SOFTGOODS) IMPLANT
SLING ULTRA II L (ORTHOPEDIC SUPPLIES) IMPLANT
STEM HUM UNIV REV SZ 12 (Stem) IMPLANT
STRIP CLOSURE SKIN 1/2X4 (GAUZE/BANDAGES/DRESSINGS) ×1 IMPLANT
SUT MNCRL AB 3-0 PS2 18 (SUTURE) ×1 IMPLANT
SUT MON AB 2-0 CT1 36 (SUTURE) ×1 IMPLANT
SUT VIC AB 1 CT1 36 (SUTURE) ×1 IMPLANT
SUTURE TAPE 1.3 40 TPR END (SUTURE) ×2 IMPLANT
TOWEL GREEN STERILE FF (TOWEL DISPOSABLE) ×1 IMPLANT
TOWEL OR 17X26 10 PK STRL BLUE (TOWEL DISPOSABLE) ×1 IMPLANT
TUBE SUCTION HIGH CAP CLEAR NV (SUCTIONS) ×1 IMPLANT
TUBING CONNECTING 10 (TUBING) ×1 IMPLANT
WATER STERILE IRR 1000ML POUR (IV SOLUTION) ×2 IMPLANT

## 2024-04-21 NOTE — Transfer of Care (Signed)
 Immediate Anesthesia Transfer of Care Note  Patient: Joshua Lowe.  Procedure(s) Performed: ARTHROPLASTY, SHOULDER, TOTAL, REVERSE (Right: Shoulder)  Patient Location: PACU  Anesthesia Type:GA combined with regional for post-op pain  Level of Consciousness: drowsy and patient cooperative  Airway & Oxygen Therapy: Patient Spontanous Breathing  Post-op Assessment: Report given to RN and Post -op Vital signs reviewed and stable  Post vital signs: Reviewed and stable  Last Vitals:  Vitals Value Taken Time  BP 130/73 04/21/24 15:03  Temp    Pulse 68 04/21/24 15:04  Resp 23 04/21/24 15:04  SpO2 96 % 04/21/24 15:04  Vitals shown include unfiled device data.  Last Pain:  Vitals:   04/21/24 1300  TempSrc:   PainSc: 0-No pain      Patients Stated Pain Goal: 5 (04/21/24 1300)  Complications: No notable events documented.

## 2024-04-21 NOTE — Op Note (Signed)
 04/21/2024  2:52 PM  PATIENT:   Arley LELON Clarence Mickey.  68 y.o. male  PRE-OPERATIVE DIAGNOSIS:  osteoarthritis shoulder right  POST-OPERATIVE DIAGNOSIS: Same  PROCEDURE: Right shoulder reverse arthroplasty lysing a press-fit size 12 Arthrex short stem humeral implant with a neutral metathesis, +6 polyethylene insert, 39/+4 glenosphere and a small/+2 baseplate  SURGEON:  Terrel Manalo, Franky BATTLE M.D.  ASSISTANTS: Randine Ricks, PA-C  Randine Ricks, PA-C was utilized as an Geophysicist/field seismologist throughout this case, essential for help with positioning the patient, positioning extremity, tissue manipulation, implantation of the prosthesis, suture management, wound closure, and intraoperative decision-making.  ANESTHESIA:   General Endotracheal and interscalene block with Exparel   EBL: 150 cc  SPECIMEN: None  Drains: None   PATIENT DISPOSITION:  PACU - hemodynamically stable.    PLAN OF CARE: Discharge to home after PACU  Brief history:  Patient is a 68 year old gentleman with chronic and progressively increasing right shoulder pain related to severe osteoarthritis.  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  Due to his increasing pain and associated functional limitations and failure to respond to prolonged attempts at conservative management, he is brought to the operating this time for planned right shoulder reverse arthroplasty.  Preoperatively, I counseled the patient regarding treatment options and risks versus benefits thereof.  Possible surgical complications were all reviewed including potential for bleeding, infection, neurovascular injury, persistent pain, loss of motion, anesthetic complication, failure of the implant, and possible need for additional surgery. They understand and accept and agrees with our planned procedure.   Procedure in detail:  After undergoing routine preop evaluation the patient received prophylactic  antibiotics and interscalene block with Exparel  was established in the holding area by the anesthesia department.  Subsequently placed spine on the operating table and underwent the smooth induction of a general endotracheal anesthesia.  Placed into the beachchair position and appropriately padded and protected.  The right shoulder girdle region was sterilely prepped and draped in standard fashion.  Timeout was called.  Deltopectoral approach made to the right shoulder through an approximate centimeter incision.  Skin flaps elevated dissection carried deep in the deltopectoral interval was then developed from proximal to distal with the vein taken laterally.  The conjoined tendon was mobilized and retracted medially and adhesions were divided beneath the deltoid.  The long head biceps tendon was then tenodesed at the upper border of the pectoralis major tendon with the proximal segment unroofed and excised.  The rotator cuff superiorly was split from the apex of the bicipital groove to the base of the coracoid and the subscap was separated from the lesser tuberosity using electrocautery the margin was tagged with a pair of grasping suture tape sutures.  Capsular attachments were then divided with anterior and inferior margins of the humeral neck and the humeral head was then delivered through the wound.  An extra medullary guide was then used to outline the proposed humeral head resection which we performed with an oscillating saw at approximately 20 degrees of retroversion.  Marginal osteophytes removed with a rondure and a metal cap was placed over the cut proximal humeral surface.  We then exposed the glenoid and performed a circumferential labral resection.  A guidepin was then directed into the center of the glenoid and the glenoid was then reamed with the central followed by the peripheral reamer to a stable subchondral bony bed in preparation was then completed with the drill and tapped for a 30 mm lag screw.  Our final implant was assembled and inserted with vancomycin powder applied on the threads of the lag screw and excellent fixation was achieved.  All the peripheral locking screws were then placed using standard technique with excellent fixation.  A 39/+4 glenosphere was then impacted onto the baseplate and the central locking screw was placed.  We returned our attention back to the proximal humerus where the canal was opened by hand reaming and ultimately broached up to a size 12 short stem at 20 degrees retroversion.  A neutral metaphyseal reaming guide was then used to prepare the metaphysis.  Trial implant was placed and trial reduction showed good motion stability and soft tissue balance.  Our trial was then removed.  The final implant was assembled.  The canal was irrigated cleaned and dried with vancomycin powder applied in the canal.  Our final implant was seated with excellent fixation.  Final reduction showed the best motion stability and soft tissue balance with a +6 poly.  Trial poly was removed and the final poly was impacted.  Our final reduction again showed excellent motion stability and soft tissue balance all much to our satisfaction.  Final irrigation was then completed.  Hemostasis was obtained.  The subscapularis was confirmed to have good elasticity and was repaired back to Thylox on the collar of the implant using the previously placed suture tape sutures.  The arm easily achieved 45 degrees of external rotation.  The balance of vancomycin powder was then sprayed liberally throughout the deep soft tissue planes.  The deltopectoral interval was reapproximated with a series of figure-of-eight Vicryl sutures.  2-0 Monocryl used to close the subcu layer and intracuticular 3-0 Monocryl used to close the skin followed by Steri-Strips and Aquacel dressing.  Right arm placed in a sling and the patient was awakened, extubated, and taken to the recovery room in stable condition.  Franky CHRISTELLA Pointer  MD   Contact # [phone]

## 2024-04-21 NOTE — Anesthesia Procedure Notes (Addendum)
 Anesthesia Regional Block: Interscalene brachial plexus block   Pre-Anesthetic Checklist: , timeout performed,  Correct Patient, Correct Site, Correct Laterality,  Correct Procedure, Correct Position, site marked,  Risks and benefits discussed,  Pre-op evaluation,  At surgeon's request and post-op pain management  Laterality: Right  Prep: Maximum Sterile Barrier Precautions used, chloraprep       Needles:  Injection technique: Single-shot  Needle Type: Echogenic Stimulator Needle     Needle Length: 4cm  Needle Gauge: 22     Additional Needles:   Procedures:,,,, ultrasound used (permanent image in chart),,    Narrative:  Start time: 04/21/2024 12:37 PM End time: 04/21/2024 12:40 PM Injection made incrementally with aspirations every 5 mL.  Performed by: Personally  Anesthesiologist: Paul Lamarr BRAVO, MD  Additional Notes: Risks, benefits, and alternative discussed. Patient gave consent for procedure. Patient prepped and draped in sterile fashion. Sedation administered, patient remains easily responsive to voice. Relevant anatomy identified with ultrasound guidance. Local anesthetic given in 5cc increments with no signs or symptoms of intravascular injection. No pain or paraesthesias with injection. Patient monitored throughout procedure with no signs of LAST or immediate complications. Tolerated well. Ultrasound image placed in chart.  LANEY Paul, MD

## 2024-04-21 NOTE — Care Plan (Signed)
 Ortho Bundle Case Management Note  Patient Details  Name: Joshua Lowe. MRN: [id number] Date of Birth: October 08, 1955  Right Reverse Shoulder Arthroplasty on 04/21/24  DCP: Home with wife DME: No needs PT: EO                   DME Arranged:  N/A DME Agency:  NA  HH Arranged:    HH Agency:     Additional Comments: Please contact me with any questions of if this plan should need to change.  Burnard Dross, Case Manager [facility] [phone]  Ext. [phone]   04/21/2024, 11:27 AM

## 2024-04-21 NOTE — H&P (Signed)
 Joshua Lowe.    Chief Complaint: osteoarthritis shoulder right HPI: The patient is a 69 y.o. male with chronic and progressive increasing right shoulder pain related to severe osteoarthritis.  Due to his increasing functional imitations, bony deformity, and failure to respond to prolonged attempts at conservative management, he is brought to the operating room today for planned right shoulder reverse arthroplasty.  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 fib   Eosinophilic disorder    Family history of adverse reaction to anesthesia    mother had nausea   Graves' disease    H/O colonoscopy 12/2008   Heart murmur    hx of years ago not detectable now   Hyperlipidemia    Hyperthyroidism    s/p RAI ablation   Hypothyroidism    Insomnia    Lumbar back pain    Osteoporosis    Persistent atrial fibrillation (HCC) 04/12/2019   Pruritus, unspecified    Sleep apnea    oral device   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       Past Surgical History:  Procedure Laterality Date   CARDIOVERSION N/A 09/01/2019   Procedure: CARDIOVERSION;  Surgeon: Nahser, Aleene PARAS, MD;  Location: [facility] ENDOSCOPY;  Service: Cardiovascular;  Laterality: N/A;   CARDIOVERSION N/A 10/27/2019   Procedure: CARDIOVERSION;  Surgeon: Pietro Redell RAMAN, MD;  Location: [facility] ENDOSCOPY;  Service: Cardiovascular;  Laterality: N/A;   FISSURECTOMY     HAND SURGERY Right    Joint  replacement middle finger   KNEE ARTHROSCOPY  1999/2007   right knee   LIPOMA EXCISION     back   NECK SURGERY  09/2003   2 level ACDF  C4-5 and C5-6 by Dr. Alix   SHOULDER SURGERY Bilateral    arthroscopies on both shoulders   TOTAL KNEE ARTHROPLASTY  Right 06/02/2022   Procedure: TOTAL KNEE ARTHROPLASTY;  Surgeon: Melodi Lerner, MD;  Location: [facility];  Service: Orthopedics;  Laterality: Right;    Family History  Problem Relation Age of Onset   Cancer Mother        skin   Atrial fibrillation Mother     Social History:  reports that he has never smoked. He has never used smokeless tobacco. He reports that he does not currently use alcohol. He reports that he does not use drugs.  BMI: Estimated body mass index is 27.8 kg/m as calculated from the following:   Height as of 04/11/24: 6' (1.829 m).   Weight as of 04/11/24: 93 kg.  Lab Results  Component Value Date   ALBUMIN 4.2 04/11/2024   Diabetes: Patient does not have a diagnosis of diabetes.     Smoking Status:   reports that he has never smoked. He has never used smokeless tobacco.     No medications prior to admission.     Physical Exam: Right shoulder demonstrates painful and severely guarded motion as noted at his recent office visits.  He has neurovascular intact in the right upper extremity.  Imaging studies confirm severe bony deformity related to underlying osteoarthritis with significant cystic change within the glenoid.  Preoperative CT scanning has been utilized to help optimize surgical planning.  Vitals     Assessment/Plan  Impression: osteoarthritis shoulder right  Plan of Action: Procedure(s): ARTHROPLASTY, SHOULDER, TOTAL, REVERSE  Joshua Lowe M Ayah Cozzolino 04/21/2024, 5:53 AM Contact # [phone]

## 2024-04-21 NOTE — Anesthesia Procedure Notes (Signed)
 Procedure Name: Intubation Date/Time: 04/21/2024 1:33 PM  Performed by: Nanci Riis, CRNAPre-anesthesia Checklist: Patient identified, Emergency Drugs available, Suction available and Patient being monitored Patient Re-evaluated:Patient Re-evaluated prior to induction Oxygen Delivery Method: Circle system utilized Preoxygenation: Pre-oxygenation with 100% oxygen Induction Type: IV induction Ventilation: Mask ventilation without difficulty Laryngoscope Size: Mac and 4 Grade View: Grade I Tube type: Oral Tube size: 7.5 mm Number of attempts: 1 Airway Equipment and Method: Stylet Placement Confirmation: ETT inserted through vocal cords under direct vision, positive ETCO2 and breath sounds checked- equal and bilateral Secured at: 22 cm Tube secured with: Tape Dental Injury: Teeth and Oropharynx as per pre-operative assessment

## 2024-04-21 NOTE — Evaluation (Signed)
 Occupational Therapy Evaluation Patient Details Name: Joshua Lowe. MRN: [id number] DOB: [birth year] Today's Date: 04/21/2024   History of Present Illness   Joshua Lowe is a 68 yr old male who is s/p a R shoulder reverse arthroplasty on 04-21-24, due to OA of the shoulder.     Clinical Impressions Pt is s/p shoulder replacement of right dominant upper extremity on 04-21-24. Therapist provided education and instruction to patient and his spouse with regards to ROM/exercises, post-op precautions, upper extremity and sling positioning, correctly donning upper extremity clothing, RUE non-weightbearing status, recommendations for bathing while maintaining shoulder precautions, use of ice for pain and edema management, how to use Iceman machine, and donning/doffing sling. Patient and spouse verbalized and demonstrated understanding as needed. Patient needed assistance to donn over head shirt, pants, and shoes, with instruction provided on compensatory strategies to perform ADLs. Patient to follow physician's recommendations for discharge plan and possible follow-up therapy recommendations.      If plan is discharge home, recommend the following:   Help with stairs or ramp for entrance;Assist for transportation;Assistance with cooking/housework;A little help with bathing/dressing/bathroom     Functional Status Assessment   Patient has had a recent decline in their functional status and demonstrates the ability to make significant improvements in function in a reasonable and predictable amount of time.     Equipment Recommendations   Tub/shower seat     Recommendations for Other Services         Precautions/Restrictions   Precautions Precautions: Shoulder Type of Shoulder Precautions: If sitting in controlled environment, ok to come out of sling to give neck a break. Please sleep in it to protect until follow up in office. OK to use operative arm for feeding, hygiene and ADLs.  Ok to instruct Pendulums and lap slides as exercises. Ok to use operative arm within the following parameters for ADL purposes New ROM (8/18) Ok for PROM, AAROM, AROM within pain tolerance and within the following ROM ER 20 ABD 45 FE 60. Sling: At all times except ADL / exercise: Yes AROM elbow, wrist and hand to tolerance: Yes AROM / PROM Forward Flexion: no resisted internal rotation and no exercises for internal rotation Shoulder Interventions: Shoulder sling/immobilizer Precaution Booklet Issued: Yes (comment) Recall of Precautions/Restrictions: Intact Required Braces or Orthoses: Sling Restrictions Weight Bearing Restrictions Per Provider Order: Yes RUE Weight Bearing Per Provider Order: Non weight bearing     Mobility Bed Mobility      General bed mobility comments: pt was received seated in the chair    Transfers Overall transfer level: Needs assistance Equipment used: None Transfers: Sit to/from Stand Sit to Stand: Supervision                  Balance Overall balance assessment: No apparent balance deficits (not formally assessed)         ADL either performed or assessed with clinical judgement   ADL Overall ADL's : Needs assistance/impaired        Upper Body Dressing : Maximal assistance;Cueing for compensatory techniques;Sitting Upper Body Dressing Details (indicate cue type and reason): assist needed for donning an around the back shirt and sling Lower Body Dressing: Moderate assistance;Sitting/lateral leans;Sit to/from stand;Cueing for compensatory techniques Lower Body Dressing Details (indicate cue type and reason): assist needed to donn pants and slide in shoes                     Vision Baseline Vision/History: 1 Wears glasses  Pertinent Vitals/Pain Pain Assessment Pain Assessment: 0-10 Pain Score: 6  Pain Location: R shoulder Pain Intervention(s): Limited activity within patient's tolerance, Monitored during session,  Repositioned     Extremity/Trunk Assessment Upper Extremity Assessment Upper Extremity Assessment: Right hand dominant   Lower Extremity Assessment Lower Extremity Assessment: Overall WFL for tasks assessed       Communication Communication Communication: No apparent difficulties   Cognition Arousal: Alert Behavior During Therapy: WFL for tasks assessed/performed Cognition: No apparent impairments             OT - Cognition Comments: Oriented x4        Following commands: Intact       Cueing  General Comments   Cueing Techniques: Verbal cues         Shoulder Instructions Shoulder Instructions Donning/doffing shirt without moving shoulder: Maximal assistance Method for sponge bathing under operated UE:  (Pt and spouse verbalized understanding) Donning/doffing sling/immobilizer: Maximal assistance Correct positioning of sling/immobilizer: Moderate assistance Pendulum exercises (written home exercise program):  (Pt and spouse verbalized understanding) ROM for elbow, wrist and digits of operated UE: Caregiver independent with task Sling wearing schedule (on at all times/off for ADL's):  (Pt and spouse verbalized understanding) Proper positioning of operated UE when showering:  (Pt and spouse verbalized understanding) Dressing change:  (Pt and spouse verbalized understanding) Positioning of UE while sleeping:  (Pt and spouse verbalized understanding)    Home Living Family/patient expects to be discharged to:: Private residence Living Arrangements: Spouse/significant other Available Help at Discharge: Family Type of Home: House Home Access: Stairs to enter Secretary/administrator of Steps: 1   Home Layout: Two level;Able to live on main level with bedroom/bathroom Alternate Level Stairs-Number of Steps: his bedroom and full bathroom are on the main level   Bathroom Shower/Tub: Producer, television/film/video: Handicapped height     Home Equipment: Clinical biochemist (2 wheels);Cane - single point          Prior Functioning/Environment Prior Level of Function : Independent/Modified Independent;Driving             Mobility Comments: Independent with ambulation. ADLs Comments: Independent with ADLs, driving, and sharing household chores with his spouse.    OT Problem List: Decreased range of motion;Impaired UE functional use;Pain;Decreased strength        OT Goals(Current goals can be found in the care plan section)   Acute Rehab OT Goals OT Goal Formulation: All assessment and education complete, DC therapy   OT Frequency:   N/A       AM-PAC OT 6 Clicks Daily Activity     Outcome Measure Help from another person eating meals?: None Help from another person taking care of personal grooming?: A Little Help from another person toileting, which includes using toliet, bedpan, or urinal?: A Little Help from another person bathing (including washing, rinsing, drying)?: A Little Help from another person to put on and taking off regular upper body clothing?: A Lot Help from another person to put on and taking off regular lower body clothing?: A Lot 6 Click Score: 17   End of Session Equipment Utilized During Treatment: Other (comment) (N/A) Nurse Communication: Other (comment) (shoulder education completed)  Activity Tolerance: Patient tolerated treatment well Patient left: in chair;with family/visitor present;with call bell/phone within reach  OT Visit Diagnosis: Muscle weakness (generalized) (M62.81);Pain Pain - Right/Left: Right Pain - part of body: Shoulder  Time: 8362-8294 OT Time Calculation (min): 28 min Charges:  OT General Charges $OT Visit: 1 Visit OT Evaluation $OT Eval Moderate Complexity: 1 Mod OT Treatments $Self Care/Home Management : 8-22 mins    Delanna JINNY Lesches, OTR/L 04/21/2024, 5:23 PM

## 2024-04-21 NOTE — Discharge Instructions (Signed)
 Joshua Lowe. Supple, M.D., F.A.A.O.S. Orthopaedic Surgery Specializing in Arthroscopic and Reconstructive Surgery of the Shoulder [phone] [address] [place], [place] [address] - Fax [phone]    POST-OP REVERSE TOTAL SHOULDER REPLACEMENT INSTRUCTIONS  1. Your first follow up appointment is:  2. The bandage over your incision is waterproof. You may begin showering with this dressing on immediately but do not submerge in a bath tub or hot tub. You may leave this dressing on until first follow up appointment in around 2 weeks. We prefer you leave this dressing in place until follow up however after 5-7 days if you are having itching or skin irritation and would like to remove it you may do so. Go slow and tug at the borders gently to break the bond the dressing has with the skin. At this point if there is no drainage it is okay to go without a bandage or you may cover it with a light gauze and tape. Leave your steri-strips across your incision. You can also expect significant bruising around your shoulder that will drift down your arm and into your chest wall. This is very normal and should resolve over several days. It is also very normal to have some swelling to the operative extremity that will involve the forearm and hand and fingers. It is recommended that if you notice this then try to elevate your hand and fist above your heart and perform gripping exercises several times an hour to help reduce this swelling (see #4).   3. Wear your sling if you are going to be up walking around and when you go to sleep at night. Also protect the arm in the sling until your nerve block has worn off. Then it is ok to remove the sling to perform the exercises below or to occasionally let your arm dangle by your side to stretch your elbow. It is ok to remove your sling if you are sitting in a controlled environment and allow your arm to rest in a position of comfort by your side or on your  lap with pillows to give your neck and skin a break from the sling. You may remove it to allow arm to dangle by side to shower. It is also ok to use your operative extremity to help do light waist level activities and to assist with hygiene and ADL's such as brushing your teeth, getting dressed and toileting. We do ask you refrain from reaching behind your body or back. Also do not use the operative arm to support body weight when getting up or down from bed or a chair.  4. Range of motion to your elbow, wrist, and hand are encouraged 3-5 times daily. Exercise to your hand and fingers helps to reduce swelling you may experience. If you have a foam ball you can use this. If not you can simply use a washcloth to squeeze.   5. Utilize the ice machine as much as possible over the first several days, but it is OK to remove to get up to walk around several times a day to stretch your legs and to go to the bathroom. When in the ice machine, please make sure there is a layer of clothing between the sleeve and your skin and check your skin frequently to make sure there is no redness or skin irritation, if you notice this then give your skin a break from the ice machine for several hours. After the first 3-4 days you  can gradually reduce how much time you spend in the ice machine and adjust to your level of pain. In general it is a good rule of thumb that if you are experiencing pain you should be using the ice machine.  6. Prescriptions for a pain medication and a muscle relaxant are provided for you. It is recommended that if you are experiencing pain that your pain medication alone is not controlling, add the muscle relaxant along with the pain medication which can give additional pain relief. The first 1-2 days after your block has worn off is generally the most severe of your pain and then should gradually decrease. As your pain lessens it is recommended that you decrease your use of the pain medications to an "as  needed basis'" only and to always comply with the recommended dosages of the pain medications. It is also ok to utilize over the counter form of pain medicines such as acetaminophen instead of the prescription pain medications if your pain is only mild. But do not use the prescription pain med and acetaminophen at the same time, it is either one or the other, not both.  7. Pain medications can produce constipation along with their use. If you experience this, the use of an over the counter stool softener or laxative daily is recommended.   8. For additional questions or concerns, please do not hesitate to call the office. If after hours there is an answering service to forward your concerns to the physician or physician assistant on call.  9. Pain control following an exparel nerve block:  To help control your post-operative pain you received a nerve block  performed with Exparel which is a long acting anesthetic (numbing agent) which can provide pain relief and sensations of numbness (and relief of pain) in the operative shoulder and arm for up to 3 days. Sometimes it provides mixed relief, meaning you may still have numbness in certain areas of the arm but can still be able to move parts of that arm, hand, and fingers. We recommend that your prescribed pain medications be used as needed. We do not feel it is necessary to "pre medicate" and "stay ahead" of pain.  Taking narcotic pain medications when you are not having any pain can lead to unnecessary and potentially dangerous side effects.  While the effects of the nerve block are present, please be aware that while you do not have sensation we recommend you pay careful attention to keep your arm positioned in a protective way. Also if you have a sling that has a "thumb loop" that helps to keep the sling from sliding backwards, make sure you remove the loop to avoid a constant pressure to the thumb which can cause some nerve damage or skin  breakdown.  10. Most people find it more comfortable to sleep in a semi upright position or in a recliner with the arm supported. Again, we do recommend you wear your sling to sleep. It is also ok to try to sleep lying flat in bed with a pillow behind your arm to keep it from sliding or falling backwards. If you are trying to sleep on the non-operative side, please use pillows to position your arm so that it does not slide forwards or backwards. It is very common that sleeping and getting into a comfortable position is difficult after shoulder surgery, this should improve with time.  11. Dressing - It is recommended you wear shirts that are loose or button up the  front. To put shirt on allow operative arm to dangle by your side and slide the shirt on that arm, then your head and then the non-operative arm. To remove the shirt do this sequence in reverse. We recommend you wear the sling over top of your shirt to prevent skin irritation. If you notice irritation in your axilla (arm pit) please try to elevate arm away from your body to allow air to get in this area and consider use of an over the counter ointment such as hydrocortisone or simple lotion.  FOR ADDITIONAL INFO ON THE DONJOY ICE MACHINE AND INSTRUCTIONS GO TO THE WEBSITE AT  [email or web address]   I  12.  We recommend that you avoid any dental work or cleaning in the first 3 months following your joint replacement. This is to help minimize the possibility of infection from the bacteria in your mouth that enters your bloodstream during dental work. We also recommend that you take an antibiotic prior to your dental work for the first year after your shoulder replacement to further help reduce that risk. Please simply contact our office for antibiotics to be sent to your pharmacy prior to dental work.  13. Post Op Exercises: Please see attached sheet with diagram

## 2024-04-21 NOTE — Anesthesia Postprocedure Evaluation (Signed)
 Anesthesia Post Note  Patient: Joshua Lowe.  Procedure(s) Performed: ARTHROPLASTY, SHOULDER, TOTAL, REVERSE (Right: Shoulder)     Patient location during evaluation: PACU Anesthesia Type: General Level of consciousness: awake and alert Pain management: pain level controlled Vital Signs Assessment: post-procedure vital signs reviewed and stable Respiratory status: spontaneous breathing, nonlabored ventilation, respiratory function stable and patient connected to nasal cannula oxygen Cardiovascular status: blood pressure returned to baseline and stable Postop Assessment: no apparent nausea or vomiting Anesthetic complications: no   No notable events documented.  Last Vitals:  Vitals:   04/21/24 1530 04/21/24 1553  BP: 125/77   Pulse: 63 60  Resp: 16 12  Temp:    SpO2: 93% 93%    Last Pain:  Vitals:   04/21/24 1535  TempSrc:   PainSc: 7                  Garnette FORBES Skillern

## 2024-04-22 ENCOUNTER — Encounter (HOSPITAL_COMMUNITY): Payer: Self-pay | Admitting: Orthopedic Surgery

## 2024-05-02 ENCOUNTER — Other Ambulatory Visit: Payer: Self-pay | Admitting: Nurse Practitioner

## 2024-05-02 DIAGNOSIS — K7401 Hepatic fibrosis, early fibrosis: Secondary | ICD-10-CM

## 2024-05-02 DIAGNOSIS — K760 Fatty (change of) liver, not elsewhere classified: Secondary | ICD-10-CM

## 2024-05-02 DIAGNOSIS — B181 Chronic viral hepatitis B without delta-agent: Secondary | ICD-10-CM

## 2024-05-17 ENCOUNTER — Ambulatory Visit
Admission: RE | Admit: 2024-05-17 | Discharge: 2024-05-17 | Disposition: A | Discharge status: Home / Self Care | Source: Ambulatory Visit | Attending: Nurse Practitioner | Admitting: Nurse Practitioner

## 2024-05-17 DIAGNOSIS — K7401 Hepatic fibrosis, early fibrosis: Secondary | ICD-10-CM

## 2024-05-17 DIAGNOSIS — B181 Chronic viral hepatitis B without delta-agent: Secondary | ICD-10-CM

## 2024-05-17 DIAGNOSIS — K760 Fatty (change of) liver, not elsewhere classified: Secondary | ICD-10-CM
